# Patient Record
Sex: Male | Born: 1965 | Race: White | Hispanic: No | Marital: Single | Smoking: Current every day smoker
Health system: Southern US, Community
[De-identification: ages and names within clinical notes are randomized; demographics above are authoritative.]

## PROBLEM LIST (undated history)

## (undated) DIAGNOSIS — I10 Essential (primary) hypertension: Secondary | ICD-10-CM

## (undated) DIAGNOSIS — E119 Type 2 diabetes mellitus without complications: Secondary | ICD-10-CM

## (undated) DIAGNOSIS — R519 Headache, unspecified: Secondary | ICD-10-CM

## (undated) DIAGNOSIS — L409 Psoriasis, unspecified: Secondary | ICD-10-CM

## (undated) DIAGNOSIS — K219 Gastro-esophageal reflux disease without esophagitis: Secondary | ICD-10-CM

## (undated) DIAGNOSIS — R51 Headache: Secondary | ICD-10-CM

## (undated) DIAGNOSIS — E7800 Pure hypercholesterolemia, unspecified: Secondary | ICD-10-CM

## (undated) DIAGNOSIS — M109 Gout, unspecified: Secondary | ICD-10-CM

## (undated) DIAGNOSIS — M1990 Unspecified osteoarthritis, unspecified site: Secondary | ICD-10-CM

## (undated) HISTORY — PX: TOTAL HIP ARTHROPLASTY: SHX124

## (undated) HISTORY — PX: JOINT REPLACEMENT: SHX530

## (undated) HISTORY — DX: Type 2 diabetes mellitus without complications: E11.9

## (undated) HISTORY — PX: SPINAL FUSION: SHX223

---

## 2001-11-13 ENCOUNTER — Emergency Department (HOSPITAL_COMMUNITY): Admission: EM | Admit: 2001-11-13 | Discharge: 2001-11-13 | Payer: Self-pay | Admitting: Emergency Medicine

## 2006-01-18 ENCOUNTER — Emergency Department (HOSPITAL_COMMUNITY): Admission: EM | Admit: 2006-01-18 | Discharge: 2006-01-18 | Payer: Self-pay | Admitting: Emergency Medicine

## 2007-06-10 ENCOUNTER — Ambulatory Visit: Payer: Self-pay | Admitting: Orthopedic Surgery

## 2007-08-17 DIAGNOSIS — I10 Essential (primary) hypertension: Secondary | ICD-10-CM

## 2007-08-19 ENCOUNTER — Emergency Department (HOSPITAL_COMMUNITY): Admission: EM | Admit: 2007-08-19 | Discharge: 2007-08-19 | Payer: Self-pay | Admitting: Emergency Medicine

## 2007-09-16 ENCOUNTER — Ambulatory Visit: Payer: Self-pay | Admitting: Orthopedic Surgery

## 2007-09-16 DIAGNOSIS — M8700 Idiopathic aseptic necrosis of unspecified bone: Secondary | ICD-10-CM | POA: Insufficient documentation

## 2007-10-11 ENCOUNTER — Ambulatory Visit: Payer: Self-pay | Admitting: Orthopedic Surgery

## 2008-01-05 ENCOUNTER — Emergency Department (HOSPITAL_COMMUNITY): Admission: EM | Admit: 2008-01-05 | Discharge: 2008-01-05 | Payer: Self-pay | Admitting: Emergency Medicine

## 2008-01-12 ENCOUNTER — Inpatient Hospital Stay (HOSPITAL_COMMUNITY): Admission: RE | Admit: 2008-01-12 | Discharge: 2008-01-18 | Payer: Self-pay | Admitting: Orthopedic Surgery

## 2008-01-29 ENCOUNTER — Emergency Department (HOSPITAL_COMMUNITY): Admission: EM | Admit: 2008-01-29 | Discharge: 2008-01-29 | Payer: Self-pay | Admitting: Emergency Medicine

## 2008-02-29 ENCOUNTER — Emergency Department (HOSPITAL_COMMUNITY): Admission: EM | Admit: 2008-02-29 | Discharge: 2008-02-29 | Payer: Self-pay | Admitting: Emergency Medicine

## 2008-03-24 ENCOUNTER — Emergency Department (HOSPITAL_COMMUNITY): Admission: EM | Admit: 2008-03-24 | Discharge: 2008-03-24 | Payer: Self-pay | Admitting: Emergency Medicine

## 2008-04-15 ENCOUNTER — Emergency Department (HOSPITAL_COMMUNITY): Admission: EM | Admit: 2008-04-15 | Discharge: 2008-04-15 | Payer: Self-pay | Admitting: Emergency Medicine

## 2008-04-18 ENCOUNTER — Encounter: Payer: Self-pay | Admitting: Orthopedic Surgery

## 2008-04-29 ENCOUNTER — Emergency Department (HOSPITAL_COMMUNITY): Admission: EM | Admit: 2008-04-29 | Discharge: 2008-04-29 | Payer: Self-pay | Admitting: Emergency Medicine

## 2008-06-12 ENCOUNTER — Emergency Department (HOSPITAL_COMMUNITY): Admission: EM | Admit: 2008-06-12 | Discharge: 2008-06-13 | Payer: Self-pay | Admitting: Emergency Medicine

## 2008-10-02 ENCOUNTER — Inpatient Hospital Stay (HOSPITAL_COMMUNITY): Admission: RE | Admit: 2008-10-02 | Discharge: 2008-10-05 | Payer: Self-pay | Admitting: Orthopedic Surgery

## 2008-12-02 ENCOUNTER — Emergency Department (HOSPITAL_COMMUNITY): Admission: EM | Admit: 2008-12-02 | Discharge: 2008-12-02 | Payer: Self-pay | Admitting: Emergency Medicine

## 2009-02-27 ENCOUNTER — Emergency Department (HOSPITAL_COMMUNITY): Admission: EM | Admit: 2009-02-27 | Discharge: 2009-02-27 | Payer: Self-pay | Admitting: Emergency Medicine

## 2009-03-30 ENCOUNTER — Emergency Department (HOSPITAL_COMMUNITY): Admission: EM | Admit: 2009-03-30 | Discharge: 2009-03-30 | Payer: Self-pay | Admitting: Emergency Medicine

## 2010-06-11 ENCOUNTER — Emergency Department (HOSPITAL_COMMUNITY): Admission: EM | Admit: 2010-06-11 | Discharge: 2010-06-12 | Payer: Self-pay | Admitting: Emergency Medicine

## 2010-09-21 ENCOUNTER — Emergency Department (HOSPITAL_COMMUNITY)
Admission: EM | Admit: 2010-09-21 | Discharge: 2010-09-21 | Payer: Self-pay | Source: Home / Self Care | Admitting: Emergency Medicine

## 2010-12-12 ENCOUNTER — Ambulatory Visit: Payer: Medicaid Other | Attending: Neurosurgery | Admitting: Physical Therapy

## 2010-12-12 DIAGNOSIS — Z96649 Presence of unspecified artificial hip joint: Secondary | ICD-10-CM | POA: Insufficient documentation

## 2010-12-12 DIAGNOSIS — R5381 Other malaise: Secondary | ICD-10-CM | POA: Insufficient documentation

## 2010-12-12 DIAGNOSIS — IMO0001 Reserved for inherently not codable concepts without codable children: Secondary | ICD-10-CM | POA: Insufficient documentation

## 2010-12-12 DIAGNOSIS — M545 Low back pain, unspecified: Secondary | ICD-10-CM | POA: Insufficient documentation

## 2010-12-12 DIAGNOSIS — R293 Abnormal posture: Secondary | ICD-10-CM | POA: Insufficient documentation

## 2010-12-13 ENCOUNTER — Ambulatory Visit: Payer: Medicaid Other | Admitting: Physical Therapy

## 2010-12-15 ENCOUNTER — Emergency Department (HOSPITAL_COMMUNITY)
Admission: EM | Admit: 2010-12-15 | Discharge: 2010-12-15 | Disposition: A | Discharge status: Home / Self Care | Payer: Medicaid Other | Attending: Emergency Medicine | Admitting: Emergency Medicine

## 2010-12-15 DIAGNOSIS — I10 Essential (primary) hypertension: Secondary | ICD-10-CM | POA: Insufficient documentation

## 2010-12-15 DIAGNOSIS — M549 Dorsalgia, unspecified: Secondary | ICD-10-CM | POA: Insufficient documentation

## 2010-12-15 DIAGNOSIS — G8929 Other chronic pain: Secondary | ICD-10-CM | POA: Insufficient documentation

## 2010-12-15 DIAGNOSIS — J45909 Unspecified asthma, uncomplicated: Secondary | ICD-10-CM | POA: Insufficient documentation

## 2010-12-19 ENCOUNTER — Ambulatory Visit: Payer: Medicaid Other | Admitting: Physical Therapy

## 2010-12-26 ENCOUNTER — Ambulatory Visit: Payer: Medicaid Other | Admitting: *Deleted

## 2011-01-09 LAB — BASIC METABOLIC PANEL
Calcium: 9.1 mg/dL (ref 8.4–10.5)
Chloride: 107 mEq/L (ref 96–112)
Creatinine, Ser: 0.77 mg/dL (ref 0.4–1.5)
GFR calc Af Amer: >60 mL/min (ref >60)
GFR calc non Af Amer: >60 mL/min (ref >60)

## 2011-01-09 LAB — DIFFERENTIAL
Lymphocytes Relative: 21 % (ref 12–46)
Lymphs Abs: 1.9 10*3/uL (ref 0.7–4.0)
Monocytes Relative: 11 % (ref 3–12)
Neutro Abs: 5.6 10*3/uL (ref 1.7–7.7)
Neutrophils Relative %: 63 % (ref 43–77)

## 2011-01-09 LAB — POCT CARDIAC MARKERS
CKMB, poc: <1 ng/mL — ABNORMAL LOW (ref 1.0–8.0)
Myoglobin, poc: 59.7 ng/mL (ref 12–200)
Troponin i, poc: <0.05 ng/mL (ref 0.00–0.09)

## 2011-01-09 LAB — CBC
MCV: 89.8 fL (ref 78.0–100.0)
RBC: 4.29 MIL/uL (ref 4.22–5.81)
WBC: 8.9 10*3/uL (ref 4.0–10.5)

## 2011-01-13 LAB — PROTIME-INR
INR: 1.1 (ref 0.00–1.49)
INR: 1.7 — ABNORMAL HIGH (ref 0.00–1.49)
INR: 2.9 — ABNORMAL HIGH (ref 0.00–1.49)
Prothrombin Time: 20.9 seconds — ABNORMAL HIGH (ref 11.6–15.2)
Prothrombin Time: 32.6 seconds — ABNORMAL HIGH (ref 11.6–15.2)

## 2011-01-13 LAB — BASIC METABOLIC PANEL
BUN: 10 mg/dL (ref 6–23)
BUN: 10 mg/dL (ref 6–23)
CO2: 25 mEq/L (ref 19–32)
Calcium: 8.3 mg/dL — ABNORMAL LOW (ref 8.4–10.5)
Calcium: 8.4 mg/dL (ref 8.4–10.5)
Creatinine, Ser: 1.01 mg/dL (ref 0.4–1.5)
GFR calc Af Amer: >60 mL/min (ref >60)
GFR calc non Af Amer: >60 mL/min (ref >60)
Glucose, Bld: 117 mg/dL — ABNORMAL HIGH (ref 70–99)

## 2011-01-13 LAB — CBC
HCT: 35.8 % — ABNORMAL LOW (ref 39.0–52.0)
Hemoglobin: 10.4 g/dL — ABNORMAL LOW (ref 13.0–17.0)
MCHC: 33.6 g/dL (ref 30.0–36.0)
MCHC: 33.7 g/dL (ref 30.0–36.0)
MCHC: 34.2 g/dL (ref 30.0–36.0)
Platelets: 267 10*3/uL (ref 150–400)
Platelets: 271 10*3/uL (ref 150–400)
Platelets: 311 10*3/uL (ref 150–400)
RBC: 3.56 MIL/uL — ABNORMAL LOW (ref 4.22–5.81)
RDW: 12.4 % (ref 11.5–15.5)
RDW: 13 % (ref 11.5–15.5)

## 2011-01-13 LAB — TYPE AND SCREEN
ABO/RH(D): O POS
Antibody Screen: NEGATIVE

## 2011-01-16 ENCOUNTER — Ambulatory Visit: Payer: Medicaid Other | Attending: Neurosurgery | Admitting: Physical Therapy

## 2011-01-16 DIAGNOSIS — IMO0001 Reserved for inherently not codable concepts without codable children: Secondary | ICD-10-CM | POA: Insufficient documentation

## 2011-01-16 DIAGNOSIS — M545 Low back pain, unspecified: Secondary | ICD-10-CM | POA: Insufficient documentation

## 2011-01-16 DIAGNOSIS — Z96649 Presence of unspecified artificial hip joint: Secondary | ICD-10-CM | POA: Insufficient documentation

## 2011-01-16 DIAGNOSIS — R5381 Other malaise: Secondary | ICD-10-CM | POA: Insufficient documentation

## 2011-01-16 DIAGNOSIS — R293 Abnormal posture: Secondary | ICD-10-CM | POA: Insufficient documentation

## 2011-01-23 ENCOUNTER — Ambulatory Visit: Payer: Medicaid Other | Admitting: Physical Therapy

## 2011-02-11 NOTE — Discharge Summary (Signed)
NAMEYANCEY, PEDLEY              ACCOUNT NO.:  [id number]   MEDICAL RECORD NO.:  [id number]          PATIENT TYPE:  INP   LOCATION:  1535                         FACILITY:  [facility]   PHYSICIAN:  Ollen Gross, M.D.    DATE OF BIRTH:  Dec 26, 1965   DATE OF ADMISSION:  01/12/2008  DATE OF DISCHARGE:  01/18/2008                               DISCHARGE SUMMARY   ADDENDUM:   ADMITTING DIAGNOSES:  Same.   DISCHARGE DIAGNOSES:  Same.   HOSPITAL COURSE:  Originally, we had set the patient to on January 17, 2008; however, once the facility was found and the bed was finalized and  accepted, it was too late for the patient to be transferred.  He  remained in the hospital 1 more day.  [facility] did accept  the patient.  Arrangements were made and he went on the following day of  January 18, 2008, after being seen by Dr. Lequita Halt.   DISCHARGE PLAN:  As per previously dictated summary.   DISPOSITION:  [facility].   CONDITION UPON DISCHARGE:  Improving.      Alexzandrew L. Perkins, P.A.C.      Ollen Gross, M.D.  Electronically Signed    ALP/MEDQ  D:  01/18/2008  T:  01/18/2008  Job:  [id number]   cc:   Melvyn Novas, MD  Fax: [phone]   [facility] [place]

## 2011-02-11 NOTE — H&P (Signed)
[name], [name]              ACCOUNT NO.:  [id number]   MEDICAL RECORD NO.:  [id number]          PATIENT TYPE:  INP   LOCATION:  0007                         FACILITY:  [facility]   PHYSICIAN:  Ollen Gross, M.D.    DATE OF BIRTH:  1965-10-03   DATE OF ADMISSION:  10/02/2008  DATE OF DISCHARGE:                              HISTORY & PHYSICAL   CHIEF COMPLAINT:  Left hip pain.   HISTORY OF THE PRESENT ILLNESS:  The patient is a 45 year old male who  has been by Dr. Lequita Halt for ongoing left hip pain.  He has had a  previous right total hip earlier this year and now presents to have the  left total hip done.  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  He is felt to be a good candidate, seen by Dr. Delbert Harness and cleared for surgery.   ALLERGIES:  NO KNOWN DRUG ALLERGIES.   CURRENT MEDICATIONS:  Diovan/HCTZ, naproxen, Vytorin, Percocet and over-  the-counter Benadryl.   PAST MEDICAL HISTORY:  History of asthma, hypertension, reflux disease,  and hypercholesterolemia.   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   FAMILY HISTORY:  Mother with history of hypertension, diabetes.  Father  with history of arthritis.  Uncle with a brain tumor.   SOCIAL HISTORY:  Single, past smoker for about 30 years.  No alcohol.  One child.  He has a friend lined up to help him after surgery.   REVIEW OF SYSTEMS:  GENERAL:  No fevers, chills or night sweats.  NEURO:  No seizures, syncope or paralysis.  RESPIRATORY:  No shortness breath, productive cough or hemoptysis.  CARDIOVASCULAR:  No chest pain or orthopnea, a little bit of heartburn  reflux, not on any medications for the past month.  He was using  Prevacid and Tums over-the-counter though.  GU: Little bit of nocturia.  No dysuria, hematuria.  MUSCULOSKELETAL:  No joint pain.   PHYSICAL  EXAMINATION:  VITALS SIGNS:  Pulse 76, respirations 14, blood  pressure 114/68.  GENERAL:  A 45 year old white male well-nourished, well-developed,  slightly overweight, in no acute distress.  HEENT: Normocephalic, atraumatic.  Pupils are equal, round and reactive.  Slight right eye deviation laterally.  Poor dentition.  NECK:  Neck is supple.  CHEST:  Clear with just a faint wheezing left mid and right upper fields  and otherwise no rhonchi or rales.  HEART:  Regular rate and rhythm.  No murmur.  S1, S2 noted.  ABDOMEN:  Soft, round.  Bowel sounds present.  RECTAL, BREASTS, GENITALIA:  Not done.  EXTREMITIES:  Left hip flexion 100 degrees minimal internal rotation, 20  degrees external rotation, 20 degrees of abduction.  Right hip shows  flexion of 110, internal rotation of 20, external rotation of 30,  abduction 30.   IMPRESSION:  AVM, left hip.   PLAN:  The patient was admitted to [facility] to undergo a  left  total replacement arthroplasty.  Surgery will be performed by Dr.  Ollen Gross.      [name], P.A.C.      Ollen Gross, M.D.  Electronically Signed    ALP/MEDQ  D:  10/01/2008  T:  10/02/2008  Job:  [id number]   cc:   Ollen Gross, M.D.  Fax: [phone]   Delbert Harness, MD

## 2011-02-11 NOTE — H&P (Signed)
[name], [name]              ACCOUNT NO.:  [id number]   MEDICAL RECORD NO.:  [id number]          PATIENT TYPE:  INP   LOCATION:                               FACILITY:  [facility]   PHYSICIAN:  Ollen Gross, M.D.    DATE OF BIRTH:  August 09, 1966   DATE OF ADMISSION:  01/12/2008  DATE OF DISCHARGE:                              HISTORY & PHYSICAL   DATE OF OFFICE VISIT AND HISTORY AND PHYSICAL:  December 31, 2007.   CHIEF COMPLAINT:  Right hip pain.   HISTORY OF PRESENT ILLNESS:  The patient is a 45 year old male who has  been seen by Dr. Lequita Halt in second opinion for bilateral hip pain.  The  right is more symptomatic and problematic than left attending.  It has  been ongoing for over a year now.  It has gotten to a point where it is  hurting him all the time.  He is seen in the office and found to have  severe osteonecrosis, with complete lapse of the femoral head and bone-  on-bone changes and loss of height in the leg due to collapse.  It is  felt he would benefit from undergoing surgical intervention.  Risks and  benefits have been discussed, and the patient has elected to undergo  surgery.   ALLERGIES:  NO KNOWN DRUG ALLERGIES.   CURRENT MEDICATIONS:  Diovan/HCT, Rozerem, Vicodin, Prevacid, Vytorin.   PAST MEDICAL HISTORY:  1. Hypercholesterolemia.  2. Hypertension.  3. Reflux disease.   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   FAMILY HISTORY:  Mother with history of hypertension, diabetes.  Father  with history of arthritis.  Uncle with a brain tumor.   SOCIAL HISTORY:  Single.  Half pack per day smoker, quit alcohol about 5  months ago.  Friend and sister are scheduled to be wit him part of the  time postoperatively.   REVIEW OF SYSTEMS:  GENERAL:  No fevers, chills, of night sweats.  NEUROLOGIC:  No seizures, syncope, or paralysis.  RESPIRATORY:  No  shortness breath, productive cough, or hemoptysis.  CARDIOVASCULAR:  No  chest pain, no  orthopnea.  GI:  No nausea, vomiting, diarrhea, or  constipation.  GU: No dysuria, hematuria, or discharge.  MUSCULOSKELETAL:  Right hip.   PHYSICAL EXAMINATION:  VITAL SIGNS: Pulse 112, respirations 14, blood  pressure 110/58.  GENERAL:  A 45 year old white male, well nourished, well developed, in  no acute distress, alert and oriented, overweight.  HEENT: Normocephalic, atraumatic.  Pupils are round, reactive.  EOMs  intact.  Poor dentition.  Several upper dentition have been removed, and  also several on the bottom row.  CHEST:  Clear.  HEART:  Regular rate and rhythm.  No murmur.  ABDOMEN:  Soft, round, protuberant abdomen.  Bowel sounds present.  RECTAL/BREASTS/GENITALIA:  Not done, not pertinent to present illness.  EXTREMITIES:  Right hip.  Right leg is shortened about 1 to 1-1/2 inches  compared to left, flexion of only 90 degrees, 0 internal rotation, 0  external rotation, 10 degrees of abduction.   IMPRESSION:  1. Avascular necrosis right hip.  2. Hypertension.  3. Hypercholesterolemia.  4. Reflux disease.   PLAN:  Patient admitted to [facility] to undergo a right total  hip replacement arthroplasty.  Surgery will be performed by Dr. Ollen Gross.      [name], P.A.C.      Ollen Gross, M.D.  Electronically Signed    ALP/MEDQ  D:  01/11/2008  T:  01/12/2008  Job:  [id number]   cc:   Melvyn Novas, MD  Fax: [phone]

## 2011-02-11 NOTE — Op Note (Signed)
[name], [name]              ACCOUNT NO.:  [id number]   MEDICAL RECORD NO.:  [id number]          PATIENT TYPE:  INP   LOCATION:  0009                         FACILITY:  [facility]   PHYSICIAN:  Ollen Gross, M.D.    DATE OF BIRTH:  1965-10-02   DATE OF PROCEDURE:  01/12/2008  DATE OF DISCHARGE:                               OPERATIVE REPORT   PREOPERATIVE DIAGNOSIS:  Avascular necrosis right hip.   POSTOPERATIVE DIAGNOSIS:  Avascular necrosis right hip.   PROCEDURE:  Right total hip arthroplasty.   SURGEON:  Ollen Gross, M.D.   ASSISTANT:  Alexzandrew L. Perkins, P.A.C.   ANESTHESIA:  General.   ESTIMATED BLOOD LOSS:  Five hundred.   DRAINS:  Hemovac x1.   COMPLICATIONS:  None.   CONDITION:  Stable to recovery room.   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  He has intractable pain and presents now for total  hip arthroplasty.   PROCEDURE IN DETAIL:  After successful administration of general  anesthetic the patient was placed in the left lateral decubitus position  with the right side up and held with the hip positioner.  Right lower  extremity was isolated from his perineum with plastic drapes and prepped  and draped in the usual sterile fashion.  Short posterolateral incision  was made with a 10 blade through subcutaneous tissue to the level of the  fascia lata which was incised in line with the skin incision.  Sciatic  nerve was palpated and protected and the short external rotators  isolated off the femur.  Capsulectomy is performed and the hip is  dislocated.  The center of the femoral head is marked and a trial  prosthesis was placed such that the center of the trial head corresponds  to the center of his native femoral head.  Osteotomy lines were marked  on the femoral neck and osteotomy made with an oscillating saw.  Femoral  head was  removed and the femur retracted anteriorly to gain acetabular  exposure.   The femur was retracted anteriorly and acetabular retractors were  placed.  Labrum and osteophytes were removed.  Reaming starts at 45 mm  in coursing increments of 2-53 mm then a 54 mL Pinnacle acetabular shell  was placed in anatomic position with outstanding fit.  The apex hole  eliminator is placed and the permanent 36 mm neutral Ultamet liner is  placed.  This a metal-on-metal hip replacement.   The femur was prepared with the canal finder and irrigation.  Axial  reaming is performed to 15.5 mm, proximal reaming to 20D and the sleeve  machined to a small.  A 20D small trial sleeve is placed with 20 x 15  stem and 36 plus 12 neck 10 degrees beyond native anteversion.  His  native anteversion was barely beyond neutral.  The trial 36 plus zero  head is placed.  We needed a 36 plus 3 to get the appropriate tension.  With the 36 plus 3 we had excellent  reduction with outstanding stability  with full extension, full external rotation, 70 degrees flexion, 40  degrees adduction, 90 degrees internal rotation, 90 degrees of flexion  and 70 degrees of internal rotation.  By placing the right leg on top of  the left it appeared the leg lengths were equalized.  The hip was then  dislocated and all trials removed.  The permanent 20D small sleeve was  placed with a 20 x 15 stem and a 36 plus 12 neck 10 degrees beyond  native anteversion.  A 36 plus 3 head is placed and the hip was reduced  with the same stability parameters.  The wound was copiously irrigated  with saline solution and the short rotators reattached to the femur  through drill holes.  The fascia lata was closed over Hemovac drain with  interrupted #1 Vicryl.  Subcu closed with #1 and 2-0 Vicryl and  subcuticular running 4-0 Monocryl.  The incision was cleaned and dried  and Steri-Strips and bulky sterile dressing applied.  The drain was  hooked to suction  and he was placed into a knee immobilizer, awakened  and transferred to recovery in stable condition.      Ollen Gross, M.D.  Electronically Signed     FA/MEDQ  D:  01/12/2008  T:  01/12/2008  Job:  [id number]

## 2011-02-11 NOTE — Discharge Summary (Signed)
[name], [name]              ACCOUNT NO.:  [id number]   MEDICAL RECORD NO.:  [id number]          PATIENT TYPE:  INP   LOCATION:  1535                         FACILITY:  [facility]   PHYSICIAN:  Ollen Gross, M.D.    DATE OF BIRTH:  April 30, 1966   DATE OF ADMISSION:  01/12/2008  DATE OF DISCHARGE:  01/17/2008                               DISCHARGE SUMMARY   REPORT TITLE:  PRIORITY SUMMARY   A prior to somewhat go with the patient to the skilled nursing facility  choice.   ADMISSION DIAGNOSES:  1. Avascular necrosis of right hip.  2. Hypertension.  3. Hypercholesterolemia.  4. Reflux disease.   DISCHARGE DIAGNOSES:  1. Avascular process of right hip status post right total hip      replacement arthroplasty.  2. Mild postop blood loss anemia, did not require transfusion.  3. Hypertension.  4. Hypercholesterolemia.  5. Reflux disease.  6. Postoperative sinus tachycardia, improved.   PROCEDURE:  January 12, 2008, right total hip surgery. Surgeon Dr. Lequita Halt  assistant Avel Peace PA-C.   ANESTHESIA:  General.   CONSULTS:  None.   BRIEF HISTORY:  [name] is a 45 year old male with severe osteonecrosis  of the right hip, stage IV, with significant collapse and secondary  degenerative changes and intractable pain, who now presents for total  hip arthroplasty.   LABORATORY DATA:  Crit CBC showed hemoglobin of 14.2, hematocrit 40.7,  white cell count 9.2 place 354,000.  Preop UA was negative.  Chem panel  on admission:  Slightly elevated alk phos of 163.  Remaining Chem panel  within normal limits.  PT/INR 13.1 and 1.0 with a PTT of 33.  Serial  protimes followed and last PT/INR was 24.3 and 2.1.  Serial CBCs were  followed.  Postop hemoglobin dropped down to 10.3 then 9.4 and last H&H  was 8.9 and 25.7.  Serial BMS and electrolytes remained within normal  limits.   X-RAYS:  1. Chest x-ray on January 04, 2008, showed no active disease.  2. Right hip films January 04, 2008, showed  significant remottle-like      effect right femoral head and right acetabulum and sclerotic      changes noted of bilateral femoral head probably due to AVN.   EKG:  January 13, 2008, showed sinus tachycardia, unconfirmed.   HOSPITAL COURSE:  The patient was admitted to [facility].  He  tolerated to procedure well and later was transferred to recovery room  and orthopedic floor.  He was started on PCA and PMAGs for pain control  following surgery.  He had a fair amount to moderate amount of pain on  the evening surgery of postop day #1.  He was a little tachy and pulses  got up to about 110 and 120.  He denied any complaints.  He did an EKG  which showed a very small slight bundle branch block, but otherwise just  showed sinus tachycardia, no changes from preop.  He was started back on  his home medications.  Hemoglobin was 10.  He had excellent urinary  output.  He started  getting up out of bed.  By day #2 he was doing a  little bit better.  Pulse had improved down to 110.  Tachycardia had  improved.  Excellent output.  Dressing change and incision looked good.  He started getting up and walking about 60 feet.  I was talking to him  about possibly going home.  However, if there were no arrangements for  him to have assistance and it was felt like he would need some type of  skilled level assistance postop.  We had discharge planning involved for  a possible skilled bed.  He did stay through the weekend and slowly  progressed with this therapy.  His pulse continued to improve and he was  back down around 100 by day three and day four, back below his baseline.  He actually had a pulse between 110 and 120 preoperatively.  Incision  continued to heal well.  He had a little serous drainage from his  Hemovac site, but no signs of infection.  He was on Coumadin for DVT  prophylaxis.  By day 4, his INR was therapeutic at 2.2.  He was seen  back in rounds on day 5 and was doing well.   INR was therapeutic.  He  was waiting for a bed and would be ready for discharge on 01/17/2008 if  bed became available.   DISCHARGE/PLAN:  1. The patient is tentatively discharged on 01/17/2008.  2. Discharge diagnoses - please see above.   CURRENT MEDICATIONS:  1. Coumadin protocol - Please titrate Coumadin level for target INR      between 2.0 and 3.0; needs to be on Coumadin for three weeks from      the date of January 12, 2008, day of surgery.  2. Colace 100 mg p.o. b.i.d.  3. Diovan HCTZ 160/12.5 p.o. daily.  4. Vytorin 10/40 p.o. daily.  5. Prevacid 30 mg daily.  6. Laxative of choice __________.  7. Percocet 5 mg 1/2 every 4 hours as needed for pain.  8. Tylenol 325, 1-2 q.4-6 h. as needed for mild pain, temp or      headache.  9. Robaxin 500 mg  p.o. q.6-8h. p.r.n. spasm.  10.Restoril 50 mg p.o. q.h.s. p.r.n. sleep.  11.Rozerem 8 mg p.o. every night p.r.n.   DISCHARGE DIET:  Low-sodium heart-healthy diet.   ACTIVITIES:  Partial weightbearing to the right lower extremity.  Gait  training ambulation and ADLs as per PT and OT.  Hip precautions with  total hip protocol, daily dressing changes.  May start showering,  however do not submerge the incision under water.   FOLLOW UP:  He is to follow up with Dr. Lequita Halt in the office two weeks  from date of surgery, April 28 or April 30, on that Tuesday or Thursday,  approximately two weeks out.  He needs to call the office for an  appointment or assist the patient in calling [phone] to arrange  followup appoint with Dr. Lequita Halt at the [facility], office at  [facility].   DISPOSITION:  Pending at this time, awaiting final bed offers and  availability.   CONDITION ON DISCHARGE:  Improving.      [name], P.A.C.      Ollen Gross, M.D.  Electronically Signed    ALP/MEDQ  D:  01/17/2008  T:  01/17/2008  Job:  [id number]   cc:   Ollen Gross, M.D.  Fax: [phone]   Melvyn Novas, MD  Fax:  [phone]   [facility]  skilled nursing facility of choice

## 2011-02-11 NOTE — Discharge Summary (Signed)
[name], Ricky              ACCOUNT NO.:  [id number]   MEDICAL RECORD NO.:  [id number]          PATIENT TYPE:  INP   LOCATION:  1621                         FACILITY:  [facility]   PHYSICIAN:  Ollen Gross, M.D.    DATE OF BIRTH:  1966/03/13   DATE OF ADMISSION:  10/02/2008  DATE OF DISCHARGE:  10/05/2008                               DISCHARGE SUMMARY   ADMISSION DIAGNOSES:  1. Avascular necrosis left hip.  2. Asthma.  3. Hypertension.  4. Reflux disease.  5. Hypercholesterolemia.   DISCHARGE DIAGNOSES:  1. Avascular necrosis left hip status post left total hip replacement      arthroplasty.  2. Mild postoperative hyponatremia improved.  3. Postoperative sinus tachycardia resolved.  4. Avascular necrosis left hip.  5. Asthma.  6. Hypertension.  7. Reflux disease.  8. Hypercholesterolemia.   PROCEDURE:  October 02, 2008 left total hip.  Surgeon Dr. Lequita Halt,  assistant Avel Peace PA-C.   CONSULTS:  None.   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  He has  had a recent right total hip and now presents for the other side.   LABORATORY DATA:  Preop labs showed a hemoglobin of 16.2, hematocrit  48.1, white cell count 11.4, platelets 326.  PT/INR 13.2 and 1.0 with  PTT of 30.  Chem panel on admission showed elevated alkaline phosphatase  of 188.  Remaining Chem panel all within normal limits.   Preoperative UA was negative.  Serial CBCs were followed throughout the  hospital course.  Hemoglobin did drop to 12, then 11.  Last hemoglobin  and hematocrit 10.4, 30.4.  Serial protimes were followed per Coumadin  protocol.  Last 32.6 and 2.9.  Sodium did drop from 138 to 134 back up  to 138.  Remaining electrolytes remained all within normal limits.   X-rays:  Preop chest x-ray dated June 12, 2008 showed no acute  cardiopulmonary abnormalities.  We had previous left hip films on  September 25, 2008.  Preop AVN left femoral head with probable associated  nondisplaced fracture, lucency in cortex of left femoral head likely  representing collapse.   Portable pelvis and portable hip films showed satisfactory appearance of  left hip after total hip replacement and we did get a postop chest x-ray  on October 04, 2008 which showed no acute cardiopulmonary process.   EKG:  Preop EKG January 13, 2008 showed sinus tachycardia, nonspecific T-  wave abnormalities compared to the January 12, 2008 since last tracing  rate is fast.  We got a follow-up EKG on October 03, 2008 showed sinus  tachycardia otherwise normal rate was 122.  Followup EKG on October 04, 2008 again showed sinus tachycardia otherwise normal, unconfirmed rate  was 111, improving.   HOSPITAL COURSE:  The patient was admitted to [facility].  Tolerated the procedure well, later transferred to the recovery room and  orthopedic floor, started on PCA and p.o. analgesic pain control.  Following surgery had a pretty rough night with pain on the first night  of surgery but doing pretty well by the next day, using PCA.  Encouraged  p.o. meds.  Was a little tachycardic later that day.  Pressure was a  little low so we gave him additional fluids, started back on his home  meds, had decent output.  We allowed him to be partial weightbearing,  started getting him out of bed by day two.  His pulse rate increased to  the 90s on day one and was around anywhere from 115-120.  We did get EKG  on postop day #2 which showed sinus tachycardia otherwise normal.  He  was asymptomatic otherwise with the exception of his pulse rate.  He was  slowly getting up with physical therapy, walking short distances in the  room.  Dressing changed and incision looked good.  He was weaned over to  p.o. meds.  Sodium was down a little bit on day one but was improved  back up by day two.  His tachycardia that started on the afternoon of  day  one was a little bit better by day two.  He was back down around 115-  110.  He wanted to go back to [place] [facility] [place] where he went the  last time so we got Child psychotherapist involved. They assisted with the  paperwork.  By postop day #3 of October 05, 2008 the patient was doing  better.  His pulse rate was down in the 80s and 90s, sinus tachycardia  resolved.  His hemoglobin was stable at 10, no complaints.  He was  tolerating his meds.  His sodium had improved and he was transferred  over at that time.   DISCHARGE/PLAN:  1. Patient discharged to [facility] on October 05, 2008.  2. Discharge diagnoses please see above.  3. Discharge meds.  Current medications at time of transfer include      Coumadin protocol.  Please titrate the Coumadin level for target      INR between 2.0 and 3.0.  The patient needs to be on Coumadin for 3      weeks following the date of surgery of October 02, 2008.  Please see      bottom of the discharge summary for Coumadin regimen in the      hospital course.  Colace 100 mg p.o. b.i.d.  Diovan / HCT  160      /12.5 p.o. daily.  Vytorin 10/40 p.o. daily, Prevacid 30 mg p.o.      daily.  We did put him on Lopressor 25 mg p.o. b.i.d. for the sinus      tachycardia.  Would continue this for an additional two more days      at [facility]  and may discontinue this on October 07, 2008 (this was not a home meds.)  Percocet 5 mg one to two every 4      hours  as needed for pain.  Tylenol 325 one or two every 4-6 hours      as needed for mild pain, temperature or headache, Robaxin 500 mg      p.o. q.6-8 hours  p.r.n. spasm, Restoril 15-30 mg p.o. at bedtime      p.r.n. sleep.  He was also to use a Ventolin HFA 2 puffs four times      a day.   DIET:  Heart-healthy diet.   ACTIVITY:  He is partial weightbearing 25% to 50%  to the left lower  extremity.  Continue gait training ambulation, ADLs, hip precautions at  all times.  PT and OT for  therapy.  He may start showering, however, do  not submerge incision under water.   FOLLOW UP:  The patient needs to follow-up with Dr. Lequita Halt at the  [facility] on Tuesday the 19th or Thursday the 21st.  Please  contact the office at [phone] to help arrange appointment time and  follow-up for this patient.   DISPOSITION:  He will be going to [employer].   CONDITION ON DISCHARGE:  Improving.   Coumadin protocol:  The admission INR was 1.0.  On postop day #1 he got  10 mg dose and the INR was 1.1.  On postop day #2 he had a 7.5 mg dose  and INR was 1.7,  on postop day #3 the dose was reduced to 2.5 mg and  the INR was 2.9 coming up fast.      Alexzandrew L. Perkins, P.A.C.      Ollen Gross, M.D.  Electronically Signed    ALP/MEDQ  D:  10/05/2008  T:  10/05/2008  Job:  [id number]   cc:   Delbert Harness, MD   Ollen Gross, M.D.  Fax: [phone]

## 2011-02-11 NOTE — Op Note (Signed)
NAMESHAMERE, CAMPAS              ACCOUNT NO.:  [id number]   MEDICAL RECORD NO.:  [id number]          PATIENT TYPE:  INP   LOCATION:  1621                         FACILITY:  [facility]   PHYSICIAN:  Ollen Gross, M.D.    DATE OF BIRTH:  1966-07-31   DATE OF PROCEDURE:  10/02/2008  DATE OF DISCHARGE:                               OPERATIVE REPORT   PREOPERATIVE DIAGNOSIS:  Avascular necrosis left hip.   POSTOPERATIVE DIAGNOSIS:  Avascular necrosis left hip.   PROCEDURE:  Left total hip arthroplasty.   SURGEON:  Ollen Gross, M.D.   ASSISTANT:  Avel Peace PA-C.   ANESTHESIA:  General.   ESTIMATED BLOOD LOSS:  300.   DRAIN:  Hemovac x1.   COMPLICATIONS:  None.   CONDITION:  Stable to recovery room.   BRIEF CLINICAL NOTE:  [name] is a 45 year old male with significant  osteonecrosis of the left hip with some femoral head collapse.  He had a  recent successful right total hip arthroplasty and presents now for left  total hip arthroplasty.   PROCEDURE IN DETAIL:  After the successful administration of general  anesthetic, the patient was placed in the right lateral decubitus  position with his left side up and held with the hip positioner.  His  left lower extremity was isolated from his perineum with plastic drapes  and prepped and draped in the usual sterile fashion.  A short  posterolateral incision was made with 10 blade through subcutaneous  tissue to the level of the fascia lata which was incised in line with  the skin incision.  The sciatic nerve was palpated and protected and the  short external rotators isolated off the femur.  A capsulectomy was  performed and the hip was dislocated.  The center of the femoral head  was marked and the trial prosthesis was placed such that the center of  the trial head corresponded to the center of his native femoral head.  An osteotomy line was marked on the femoral neck and osteotomy made with  an oscillating saw.  The femoral  head was removed and then the femur  retracted anteriorly to gain acetabular exposure.   The acetabular retractors were placed and labrum and osteophytes  removed.  Acetabular reaming started at 45 mm, coursing increments of 2-  53 mm and a 54 mL pinnacle acetabular shell was placed in an anatomic  position and transfixed with two dome screws.  The apex hole eliminator  was placed and the 36 mm neutral Ultramet metal liner was placed for  metal-on-metal hip replacement.   The femur was repaired with the canal finder and irrigation.  Axial  reaming was performed to 15.5 mm, proximal reaming to a 20D and the  sleeve machine to a small.  The 20D small trial sleeve was placed with a  20 x 15 stem and a 36+ 8 neck.  The neck was matching his native  anteversion.  A 36+ 0 head was placed.  The hip needed more offset, so I  went to 36+ 12 neck with the 36+ 0 head and reduced it with  excellent  stability with full extension, full external rotation, 70 degrees  flexion, 40 degrees of adduction and about 90 degrees internal rotation  and 90 degrees of flexion and 70 degrees of internal rotation.  There  was a little bit of shuck in the hip, so I went to 36 +3 which  eliminated that and still maintained the excellent stability.  The hip  was then dislocated and the trials removed.  Permanent 20D small sleeve  was placed with a 20 x 15 stem, 36+ 12 neck, matching native  anteversion.  The 36+ 3 head was placed and the hip was reduced with the  same stability parameters.  The wound was copiously irrigated with  saline solution and the short rotators reattached to the femur through  drill holes.  The fascia lata was closed over a Hemovac drain with  interrupted #1 Vicryl, subcu closed with #1-0 and #2-0 Vicryl and  subcuticular with running 4-0 Monocryl.  Please note that after we did  the trials, the left leg had been placed on top of the right and the leg  lengths were equal.  Once closed, we then  hooked the drain up to suction  and the incisions cleaned and dried and Steri-Strips and a bulky sterile  dressing applied.  He was then placed into a knee immobilizer, awakened  and transported to recovery in stable condition.      Ollen Gross, M.D.  Electronically Signed     FA/MEDQ  D:  10/02/2008  T:  10/02/2008  Job:  [id number]

## 2011-06-24 LAB — PROTIME-INR
INR: 1.1
INR: 2 — ABNORMAL HIGH
INR: 2.1 — ABNORMAL HIGH
INR: 2.2 — ABNORMAL HIGH
INR: 2.4 — ABNORMAL HIGH
INR: 3 — ABNORMAL HIGH
Prothrombin Time: 14.5
Prothrombin Time: 22.9 — ABNORMAL HIGH
Prothrombin Time: 24.3 — ABNORMAL HIGH
Prothrombin Time: 25.1 — ABNORMAL HIGH
Prothrombin Time: 26.8 — ABNORMAL HIGH
Prothrombin Time: 32.1 — ABNORMAL HIGH

## 2011-06-24 LAB — CBC
HCT: 40.7
HCT: 25.7 — ABNORMAL LOW
HCT: 29.8 — ABNORMAL LOW
Hemoglobin: 8.9 — ABNORMAL LOW
Hemoglobin: 9.4 — ABNORMAL LOW
Platelets: 354
Platelets: 270
Platelets: 319
RBC: 3.12 — ABNORMAL LOW
RDW: 12.9
RDW: 12.9
RDW: 13.1
WBC: 9.2
WBC: 10.2
WBC: 12.4 — ABNORMAL HIGH
WBC: 13.6 — ABNORMAL HIGH

## 2011-06-24 LAB — URINALYSIS, ROUTINE W REFLEX MICROSCOPIC
Bilirubin Urine: NEGATIVE
Glucose, UA: NEGATIVE
Hgb urine dipstick: NEGATIVE
Ketones, ur: NEGATIVE
Nitrite: NEGATIVE
Protein, ur: NEGATIVE
Specific Gravity, Urine: 1.02
Urobilinogen, UA: 1
pH: 7.5

## 2011-06-24 LAB — BASIC METABOLIC PANEL
BUN: 7
CO2: 28
Calcium: 8.3 — ABNORMAL LOW
Calcium: 8.8
Chloride: 101
Creatinine, Ser: 0.8
Creatinine, Ser: 0.89
GFR calc Af Amer: >60
GFR calc Af Amer: >60
GFR calc non Af Amer: >60
GFR calc non Af Amer: >60
Glucose, Bld: 139 — ABNORMAL HIGH
Potassium: 4.5
Sodium: 135
Sodium: 136

## 2011-06-24 LAB — ABO/RH: ABO/RH(D): O POS

## 2011-06-24 LAB — COMPREHENSIVE METABOLIC PANEL
ALT: 19
AST: 22
Albumin: 3.8
Alkaline Phosphatase: 163 — ABNORMAL HIGH
BUN: 7
CO2: 32
Calcium: 9.7
Chloride: 99
Creatinine, Ser: 0.91
GFR calc Af Amer: >60
GFR calc non Af Amer: >60
Glucose, Bld: 97
Potassium: 3.9
Sodium: 140
Total Bilirubin: 1
Total Protein: 7.1

## 2011-06-24 LAB — TYPE AND SCREEN: Antibody Screen: NEGATIVE

## 2011-07-04 LAB — COMPREHENSIVE METABOLIC PANEL
ALT: 32 U/L (ref 0–53)
CO2: 27 mEq/L (ref 19–32)
Calcium: 9.6 mg/dL (ref 8.4–10.5)
Creatinine, Ser: 0.72 mg/dL (ref 0.4–1.5)
GFR calc non Af Amer: >60 mL/min (ref >60)
Glucose, Bld: 81 mg/dL (ref 70–99)
Sodium: 138 mEq/L (ref 135–145)
Total Protein: 7.8 g/dL (ref 6.0–8.3)

## 2011-07-04 LAB — URINALYSIS, ROUTINE W REFLEX MICROSCOPIC
Ketones, ur: NEGATIVE mg/dL
Nitrite: NEGATIVE
Protein, ur: NEGATIVE mg/dL
Urobilinogen, UA: 0.2 mg/dL (ref 0.0–1.0)

## 2011-07-04 LAB — CBC
Hemoglobin: 16.2 g/dL (ref 13.0–17.0)
MCHC: 33.7 g/dL (ref 30.0–36.0)
MCV: 92.1 fL (ref 78.0–100.0)
RBC: 5.22 MIL/uL (ref 4.22–5.81)
RDW: 12.9 % (ref 11.5–15.5)

## 2011-07-04 LAB — PROTIME-INR
INR: 1 (ref 0.00–1.49)
Prothrombin Time: 13.4 seconds (ref 11.6–15.2)

## 2011-07-04 LAB — APTT: aPTT: 33 seconds (ref 24–37)

## 2012-01-15 ENCOUNTER — Encounter (HOSPITAL_COMMUNITY): Payer: Self-pay

## 2012-01-15 ENCOUNTER — Emergency Department (HOSPITAL_COMMUNITY)
Admission: EM | Admit: 2012-01-15 | Discharge: 2012-01-15 | Disposition: A | Discharge status: Home / Self Care | Payer: Medicaid Other | Attending: Emergency Medicine | Admitting: Emergency Medicine

## 2012-01-15 DIAGNOSIS — F17200 Nicotine dependence, unspecified, uncomplicated: Secondary | ICD-10-CM | POA: Insufficient documentation

## 2012-01-15 DIAGNOSIS — K047 Periapical abscess without sinus: Secondary | ICD-10-CM | POA: Insufficient documentation

## 2012-01-15 DIAGNOSIS — K029 Dental caries, unspecified: Secondary | ICD-10-CM | POA: Insufficient documentation

## 2012-01-15 DIAGNOSIS — K0889 Other specified disorders of teeth and supporting structures: Secondary | ICD-10-CM

## 2012-01-15 DIAGNOSIS — K219 Gastro-esophageal reflux disease without esophagitis: Secondary | ICD-10-CM | POA: Insufficient documentation

## 2012-01-15 DIAGNOSIS — M129 Arthropathy, unspecified: Secondary | ICD-10-CM | POA: Insufficient documentation

## 2012-01-15 DIAGNOSIS — E7800 Pure hypercholesterolemia, unspecified: Secondary | ICD-10-CM | POA: Insufficient documentation

## 2012-01-15 HISTORY — DX: Gastro-esophageal reflux disease without esophagitis: K21.9

## 2012-01-15 HISTORY — DX: Unspecified osteoarthritis, unspecified site: M19.90

## 2012-01-15 HISTORY — DX: Pure hypercholesterolemia, unspecified: E78.00

## 2012-01-15 HISTORY — DX: Essential (primary) hypertension: I10

## 2012-01-15 MED ORDER — PENICILLIN V POTASSIUM 500 MG PO TABS: 500.0000 mg | ORAL_TABLET | Freq: Four times a day (QID) | ORAL | Status: AC

## 2012-01-15 MED ORDER — OXYCODONE-ACETAMINOPHEN 5-325 MG PO TABS: ORAL_TABLET | ORAL | Status: DC

## 2012-01-15 NOTE — ED Provider Notes (Signed)
Medical screening examination/treatment/procedure(s) were performed by non-physician practitioner and as supervising physician I was immediately available for consultation/collaboration.  Sharrie Self L Odas Ozer, MD 01/15/12 1549 

## 2012-01-15 NOTE — ED Provider Notes (Signed)
History     CSN: [id number]  Arrival date & time 01/15/12  1005   First MD Initiated Contact with Patient 01/15/12 1024      Chief Complaint  Patient presents with  . Mouth Lesions    (Consider location/radiation/quality/duration/timing/severity/associated sxs/prior treatment) HPI Comments: Pt c/o a whitish lesion to R inner cheek.  What he states is hurting though is one of the R lower premolars with associated jaw swelling.  Takes percocet regularly for chronic hip pain but has been taking more than usual the past couple days for mouth pain.  He denies abnormal immune status.  Not taking steroidal meds..  Does not have dentist  Patient is a 46 y.o. male presenting with mouth sores. The history is provided by the patient. No language interpreter was used.  Mouth Lesions  The current episode started 2 days ago. The onset was sudden. The problem has been unchanged. The problem is severe. The symptoms are relieved by nothing. The symptoms are aggravated by nothing. Associated symptoms include mouth sores. Pertinent negatives include no fever. He has been behaving normally. He has been eating and drinking normally.    Past Medical History  Diagnosis Date  . High blood pressure   . Asthma   . Arthritis   . Acid reflux disease   . High cholesterol     Past Surgical History  Procedure Date  . Joint replacement   . Total hip arthroplasty     No family history on file.  History  Substance Use Topics  . Smoking status: Current Everyday Smoker    Types: Cigarettes  . Smokeless tobacco: Not on file  . Alcohol Use: No      Review of Systems  Constitutional: Negative for fever.  HENT: Positive for mouth sores and dental problem.   All other systems reviewed and are negative.    Allergies  Review of patient's allergies indicates no known allergies.  Home Medications   Current Outpatient Rx  Name Route Sig Dispense Refill  . OXYCODONE-ACETAMINOPHEN 5-325 MG PO TABS   One po q 6 hrs prn pain 20 tablet 0  . PENICILLIN V POTASSIUM 500 MG PO TABS Oral Take 1 tablet (500 mg total) by mouth 4 (four) times daily. 40 tablet 0    BP 102/70  Pulse 113  Temp(Src) 98 F (36.7 C) (Oral)  Resp 18  Ht 5\' 9"  (1.753 m)  Wt 207 lb (93.895 kg)  BMI 30.57 kg/m2  SpO2 99%  Physical Exam  Nursing note and vitals reviewed. Constitutional: He is oriented to person, place, and time. He appears well-developed and well-nourished.  HENT:  Head: Normocephalic and atraumatic. No trismus in the jaw.  Mouth/Throat: Uvula is midline, oropharynx is clear and moist and mucous membranes are normal. Dental abscesses and dental caries present. No uvula swelling. No posterior oropharyngeal edema, posterior oropharyngeal erythema or tonsillar abscesses.    Eyes: EOM are normal.  Neck: Normal range of motion.  Cardiovascular: Normal rate, regular rhythm, normal heart sounds and intact distal pulses.   Pulmonary/Chest: Effort normal and breath sounds normal. No respiratory distress.  Abdominal: Soft. He exhibits no distension. There is no tenderness.  Musculoskeletal: Normal range of motion.  Lymphadenopathy:    He has no cervical adenopathy.  Neurological: He is alert and oriented to person, place, and time.  Skin: Skin is warm and dry.  Psychiatric: He has a normal mood and affect. Judgment normal.    ED Course  Procedures (including critical care  time)  Labs Reviewed - No data to display No results found.   1. Pain, dental       MDM  rx- prn VK, 40 rx- percocet, 20 Ibuprofen F/u with dentist of [place] ASAP and with dr. Janna Arch for any further pain meds.        Worthy Rancher, PA 01/15/12 [phone]

## 2012-01-15 NOTE — ED Notes (Signed)
Complain of pain and ulcers in mouth.

## 2012-01-15 NOTE — Discharge Instructions (Signed)
Dental Pain A tooth ache may be caused by cavities (tooth decay). Cavities expose the nerve of the tooth to air and hot or cold temperatures. It may come from an infection or abscess (also called a boil or furuncle) around your tooth. It is also often caused by dental caries (tooth decay). This causes the pain you are having. DIAGNOSIS  Your caregiver can diagnose this problem by exam. TREATMENT   If caused by an infection, it may be treated with medications which kill germs (antibiotics) and pain medications as prescribed by your caregiver. Take medications as directed.   Only take over-the-counter or prescription medicines for pain, discomfort, or fever as directed by your caregiver.   Whether the tooth ache today is caused by infection or dental disease, you should see your dentist as soon as possible for further care.  SEEK MEDICAL CARE IF: The exam and treatment you received today has been provided on an emergency basis only. This is not a substitute for complete medical or dental care. If your problem worsens or new problems (symptoms) appear, and you are unable to meet with your dentist, call or return to this location. SEEK IMMEDIATE MEDICAL CARE IF:   You have a fever.   You develop redness and swelling of your face, jaw, or neck.   You are unable to open your mouth.   You have severe pain uncontrolled by pain medicine.  MAKE SURE YOU:   Understand these instructions.   Will watch your condition.   Will get help right away if you are not doing well or get worse.  Document Released: 09/15/2005 Document Revised: 09/04/2011 Document Reviewed: 05/03/2008 [facility] Patient Information 2012 [place], [place].    take the meds as directed.  Follow up with the dentist of your choice.  Follow up with dr. Janna Arch for any further pain medication needs.

## 2012-02-25 ENCOUNTER — Emergency Department (HOSPITAL_COMMUNITY)
Admission: EM | Admit: 2012-02-25 | Discharge: 2012-02-25 | Disposition: A | Discharge status: Home / Self Care | Payer: Medicaid Other | Attending: Emergency Medicine | Admitting: Emergency Medicine

## 2012-02-25 ENCOUNTER — Encounter (HOSPITAL_COMMUNITY): Payer: Self-pay | Admitting: Emergency Medicine

## 2012-02-25 DIAGNOSIS — L02211 Cutaneous abscess of abdominal wall: Secondary | ICD-10-CM

## 2012-02-25 DIAGNOSIS — Z8739 Personal history of other diseases of the musculoskeletal system and connective tissue: Secondary | ICD-10-CM | POA: Insufficient documentation

## 2012-02-25 DIAGNOSIS — E7800 Pure hypercholesterolemia, unspecified: Secondary | ICD-10-CM | POA: Insufficient documentation

## 2012-02-25 DIAGNOSIS — L02219 Cutaneous abscess of trunk, unspecified: Secondary | ICD-10-CM | POA: Insufficient documentation

## 2012-02-25 DIAGNOSIS — K219 Gastro-esophageal reflux disease without esophagitis: Secondary | ICD-10-CM | POA: Insufficient documentation

## 2012-02-25 DIAGNOSIS — Z79899 Other long term (current) drug therapy: Secondary | ICD-10-CM | POA: Insufficient documentation

## 2012-02-25 DIAGNOSIS — J45909 Unspecified asthma, uncomplicated: Secondary | ICD-10-CM | POA: Insufficient documentation

## 2012-02-25 DIAGNOSIS — L03319 Cellulitis of trunk, unspecified: Secondary | ICD-10-CM | POA: Insufficient documentation

## 2012-02-25 MED ORDER — LIDOCAINE-EPINEPHRINE (PF) 1 %-1:200000 IJ SOLN
INTRAMUSCULAR | Status: AC
  Administered 2012-02-25: 08:00:00 via SUBCUTANEOUS
  Filled 2012-02-25: qty 10

## 2012-02-25 MED ORDER — LIDOCAINE-EPINEPHRINE 2 %-1:100000 IJ SOLN: 20.0000 mL | Freq: Once | INTRAMUSCULAR | Status: DC

## 2012-02-25 MED ORDER — OXYCODONE-ACETAMINOPHEN 5-325 MG PO TABS: 1.0000 | ORAL_TABLET | Freq: Four times a day (QID) | ORAL | Status: DC | PRN

## 2012-02-25 MED ORDER — CEPHALEXIN 500 MG PO CAPS: 500.0000 mg | ORAL_CAPSULE | Freq: Four times a day (QID) | ORAL | Status: DC

## 2012-02-25 MED ORDER — SULFAMETHOXAZOLE-TRIMETHOPRIM 800-160 MG PO TABS: 1.0000 | ORAL_TABLET | Freq: Three times a day (TID) | ORAL | Status: DC

## 2012-02-25 NOTE — ED Provider Notes (Signed)
History    Scribed for [employer]. Rubin Payor, MD, the patient was seen in room APA07/APA07. This chart was scribed by Katha Cabal.   CSN: [id number]  Arrival date & time 02/25/12  4098   First MD Initiated Contact with Patient 02/25/12 0701      Chief Complaint  Patient presents with  . Insect Bite    (Consider location/radiation/quality/duration/timing/severity/associated sxs/prior treatment) HPI Juliet Rude. Rubin Payor, MD entered patient's room at 0708   HUSAYN REIM is a 46 y.o. male who presents to the Emergency Department complaining of lower abdominal bump with associated redness and moderate pain from possible insect bite.  Symptoms began last night and had worsened by morning.  Patient reports redness around bump began around his belly button.  States he had similar episode 4-5 months ago but there was no bump present.  There is no history of abdominal surgery.  Patient reports feeling feverish and sweaty.  Patient states that his roommate has also had an abscess recently. He   PCP Isabella Stalling, MD, MD    Past Medical History  Diagnosis Date  . High blood pressure   . Asthma   . Arthritis   . Acid reflux disease   . High cholesterol     Past Surgical History  Procedure Date  . Joint replacement   . Total hip arthroplasty     History reviewed. No pertinent family history.  History  Substance Use Topics  . Smoking status: Current Everyday Smoker    Types: Cigarettes  . Smokeless tobacco: Not on file  . Alcohol Use: No      Review of Systems  Constitutional: Positive for fever and diaphoresis.  HENT: Negative.   Eyes: Negative.   Respiratory: Negative.   Cardiovascular: Negative.   Gastrointestinal: Negative.   Genitourinary: Negative.   Musculoskeletal: Negative.   Skin: Negative.   Neurological: Negative.   Hematological: Negative.   Psychiatric/Behavioral: Negative.     Allergies  Review of patient's allergies indicates no known  allergies.  Home Medications   Current Outpatient Rx  Name Route Sig Dispense Refill  . ALBUTEROL SULFATE HFA 108 (90 BASE) MCG/ACT IN AERS Inhalation Inhale 2 puffs into the lungs 4 (four) times daily. For shortness of breath or wheezing    . ATORVASTATIN CALCIUM 40 MG PO TABS Oral Take 40 mg by mouth daily.    . CYCLOBENZAPRINE HCL 10 MG PO TABS Oral Take 10 mg by mouth 3 (three) times daily as needed. For muscle spasms/pain    . DOXEPIN HCL 6 MG PO TABS Oral Take 1 tablet by mouth daily.    Marland Kitchen NAPROXEN 500 MG PO TABS Oral Take 500 mg by mouth 2 (two) times daily with a meal.    . OMEPRAZOLE 20 MG PO CPDR Oral Take 20 mg by mouth daily.    . OXYCODONE-ACETAMINOPHEN 5-325 MG PO TABS  One po q 6 hrs prn pain 20 tablet 0  . CEPHALEXIN 500 MG PO CAPS Oral Take 1 capsule (500 mg total) by mouth 4 (four) times daily. 20 capsule 0  . OXYCODONE-ACETAMINOPHEN 5-325 MG PO TABS Oral Take 1-2 tablets by mouth every 6 (six) hours as needed for pain. 10 tablet 0  . SULFAMETHOXAZOLE-TRIMETHOPRIM 800-160 MG PO TABS Oral Take 1 tablet by mouth 3 (three) times daily. 15 tablet 0    BP 140/86  Pulse 114  Temp(Src) 97.8 F (36.6 C) (Oral)  Resp 18  Ht 5\' 9"  (1.753 m)  Wt 199 lb (  90.266 kg)  BMI 29.39 kg/m2  SpO2 97%  Physical Exam  Nursing note and vitals reviewed. Constitutional: He is oriented to person, place, and time. He appears well-developed and well-nourished. No distress.  HENT:  Head: Normocephalic and atraumatic.  Eyes: EOM are normal.  Neck: Neck supple. No tracheal deviation present.  Cardiovascular: Normal rate and regular rhythm.   Pulmonary/Chest: Effort normal and breath sounds normal. No respiratory distress.  Abdominal: There is tenderness. No hernia. Hernia confirmed negative in the right inguinal area and confirmed negative in the left inguinal area.       10 cm erythema, 4 cm tender mass working down abdominal fat, fast ultrasound indicated abcess  Musculoskeletal: Normal  range of motion.  Neurological: He is alert and oriented to person, place, and time.  Skin: Skin is warm and dry.  Psychiatric: He has a normal mood and affect. His behavior is normal.    ED Course  Procedures (including critical care time)     DIAGNOSTIC STUDIES: Oxygen Saturation is 97% on room air, normal by my interpretation.     COORDINATION OF CARE: 0728-  Physical exam complete.   Will perform I&D.   0803-  I&D complete.  Will treat patient with antibiotics.  Patient to return for wound check in 2 days.     LABS / RADIOLOGY:   Labs Reviewed - No data to display No results found.    INCISION AND DRAINAGE Performed by: Billee Cashing. Consent: Verbal consent obtained. Risks and benefits: risks, benefits and alternatives were discussed Type: abscess  Body area: Abdomen  Anesthesia: local infiltration  Local anesthetic: lidocaine 1 % with epinephrine  Anesthetic total: 5 ml  Complexity: complex Blunt dissection to break up loculations  Drainage: purulent  Drainage amount: Mild   Packing material: 1/2 in iodoform gauze  Patient tolerance: Patient tolerated the procedure well with no immediate complications.     MDM  Patient with abscess 2 lower abdomen. There is some surrounding cellulitis. Wound was incised and drained with some minimal amount of purulent drainage. He was packed. Patient return in 2 days to have packing removed. He was discharged with antibiotics and pain medication           MEDICATIONS GIVEN IN THE E.D. Scheduled Meds:    . lidocaine-EPINEPHrine  20 mL Infiltration Once  . lidocaine-EPINEPHrine       Continuous Infusions:      IMPRESSION: 1. Abscess of abdominal wall      NEW MEDICATIONS: New Prescriptions   CEPHALEXIN (KEFLEX) 500 MG CAPSULE    Take 1 capsule (500 mg total) by mouth 4 (four) times daily.   OXYCODONE-ACETAMINOPHEN (PERCOCET) 5-325 MG PER TABLET    Take 1-2 tablets by mouth every 6 (six)  hours as needed for pain.   SULFAMETHOXAZOLE-TRIMETHOPRIM (BACTRIM DS,SEPTRA DS) 800-160 MG PER TABLET    Take 1 tablet by mouth 3 (three) times daily.     I personally performed the services described in this documentation, which was scribed in my presence. The recorded information has been reviewed and considered.      Juliet Rude. Rubin Payor, MD 02/25/12 5621

## 2012-02-25 NOTE — Discharge Instructions (Signed)

## 2012-02-25 NOTE — ED Notes (Signed)
Pt c/o insect bite to the lower abd. The area is red and warm to the touch. Pt states he noticed it last night.

## 2012-02-27 ENCOUNTER — Inpatient Hospital Stay (HOSPITAL_COMMUNITY)
Admission: EM | Admit: 2012-02-27 | Discharge: 2012-03-01 | DRG: 603 | Disposition: A | Discharge status: Home / Self Care | Payer: Medicaid Other | Attending: Family Medicine | Admitting: Family Medicine

## 2012-02-27 ENCOUNTER — Encounter (HOSPITAL_COMMUNITY): Payer: Self-pay | Admitting: Emergency Medicine

## 2012-02-27 DIAGNOSIS — Z96649 Presence of unspecified artificial hip joint: Secondary | ICD-10-CM

## 2012-02-27 DIAGNOSIS — E785 Hyperlipidemia, unspecified: Secondary | ICD-10-CM | POA: Diagnosis present

## 2012-02-27 DIAGNOSIS — Z8679 Personal history of other diseases of the circulatory system: Secondary | ICD-10-CM

## 2012-02-27 DIAGNOSIS — A4902 Methicillin resistant Staphylococcus aureus infection, unspecified site: Secondary | ICD-10-CM | POA: Diagnosis present

## 2012-02-27 DIAGNOSIS — J45909 Unspecified asthma, uncomplicated: Secondary | ICD-10-CM | POA: Diagnosis present

## 2012-02-27 DIAGNOSIS — M129 Arthropathy, unspecified: Secondary | ICD-10-CM | POA: Diagnosis present

## 2012-02-27 DIAGNOSIS — D72829 Elevated white blood cell count, unspecified: Secondary | ICD-10-CM | POA: Diagnosis present

## 2012-02-27 DIAGNOSIS — L02211 Cutaneous abscess of abdominal wall: Secondary | ICD-10-CM

## 2012-02-27 DIAGNOSIS — F17200 Nicotine dependence, unspecified, uncomplicated: Secondary | ICD-10-CM | POA: Diagnosis present

## 2012-02-27 DIAGNOSIS — I10 Essential (primary) hypertension: Secondary | ICD-10-CM | POA: Diagnosis present

## 2012-02-27 DIAGNOSIS — K219 Gastro-esophageal reflux disease without esophagitis: Secondary | ICD-10-CM | POA: Diagnosis present

## 2012-02-27 DIAGNOSIS — L0390 Cellulitis, unspecified: Secondary | ICD-10-CM

## 2012-02-27 DIAGNOSIS — L02219 Cutaneous abscess of trunk, unspecified: Principal | ICD-10-CM | POA: Diagnosis present

## 2012-02-27 DIAGNOSIS — E039 Hypothyroidism, unspecified: Secondary | ICD-10-CM | POA: Diagnosis present

## 2012-02-27 LAB — DIFFERENTIAL
Basophils Absolute: 0 10*3/uL (ref 0.0–0.1)
Basophils Relative: 0 % (ref 0–1)
Eosinophils Absolute: 0.4 10*3/uL (ref 0.0–0.7)
Neutro Abs: 17.5 10*3/uL — ABNORMAL HIGH (ref 1.7–7.7)
Neutrophils Relative %: 82 % — ABNORMAL HIGH (ref 43–77)

## 2012-02-27 LAB — BASIC METABOLIC PANEL
GFR calc Af Amer: >90 mL/min (ref >90)
GFR calc non Af Amer: >90 mL/min (ref >90)
Potassium: 4.1 mEq/L (ref 3.5–5.1)
Sodium: 134 mEq/L — ABNORMAL LOW (ref 135–145)

## 2012-02-27 LAB — LIPID PANEL
Cholesterol: 160 mg/dL (ref 0–200)
HDL: 65 mg/dL (ref >39)
Total CHOL/HDL Ratio: 2.5 RATIO
VLDL: 34 mg/dL (ref 0–40)

## 2012-02-27 LAB — HEPATIC FUNCTION PANEL
Alkaline Phosphatase: 122 U/L — ABNORMAL HIGH (ref 39–117)
Indirect Bilirubin: 0.2 mg/dL — ABNORMAL LOW (ref 0.3–0.9)
Total Bilirubin: 0.3 mg/dL (ref 0.3–1.2)
Total Protein: 7.2 g/dL (ref 6.0–8.3)

## 2012-02-27 LAB — CBC
MCH: 30.7 pg (ref 26.0–34.0)
MCHC: 33.3 g/dL (ref 30.0–36.0)
Platelets: 273 10*3/uL (ref 150–400)

## 2012-02-27 LAB — MAGNESIUM: Magnesium: 2.2 mg/dL (ref 1.5–2.5)

## 2012-02-27 MED ORDER — SULFAMETHOXAZOLE-TMP DS 800-160 MG PO TABS
1.0000 | ORAL_TABLET | Freq: Two times a day (BID) | ORAL | Status: DC
  Administered 2012-02-27 – 2012-03-01 (×7): 1 via ORAL
  Filled 2012-02-27 (×7): qty 1

## 2012-02-27 MED ORDER — SIMVASTATIN 20 MG PO TABS
20.0000 mg | ORAL_TABLET | Freq: Every day | ORAL | Status: DC
  Administered 2012-02-27 – 2012-02-29 (×3): 20 mg via ORAL
  Filled 2012-02-27 (×3): qty 1

## 2012-02-27 MED ORDER — NICOTINE 21 MG/24HR TD PT24
MEDICATED_PATCH | TRANSDERMAL | Status: AC
  Filled 2012-02-27: qty 1

## 2012-02-27 MED ORDER — ENOXAPARIN SODIUM 40 MG/0.4ML ~~LOC~~ SOLN
40.0000 mg | SUBCUTANEOUS | Status: DC
  Administered 2012-02-27 – 2012-02-29 (×3): 40 mg via SUBCUTANEOUS
  Filled 2012-02-27 (×3): qty 0.4

## 2012-02-27 MED ORDER — ONDANSETRON HCL 4 MG/2ML IJ SOLN: 4.0000 mg | Freq: Three times a day (TID) | INTRAMUSCULAR | Status: AC | PRN

## 2012-02-27 MED ORDER — LISINOPRIL 10 MG PO TABS
20.0000 mg | ORAL_TABLET | Freq: Every day | ORAL | Status: DC
  Administered 2012-02-27 – 2012-03-01 (×3): 20 mg via ORAL
  Filled 2012-02-27 (×4): qty 2

## 2012-02-27 MED ORDER — VANCOMYCIN HCL 1000 MG IV SOLR
1500.0000 mg | Freq: Two times a day (BID) | INTRAVENOUS | Status: DC
  Administered 2012-02-27 – 2012-02-29 (×5): 1500 mg via INTRAVENOUS
  Filled 2012-02-27 (×8): qty 1500

## 2012-02-27 MED ORDER — HYDROMORPHONE HCL PF 1 MG/ML IJ SOLN
1.0000 mg | INTRAMUSCULAR | Status: AC | PRN
  Administered 2012-02-27 (×3): 1 mg via INTRAVENOUS
  Filled 2012-02-27 (×2): qty 1

## 2012-02-27 MED ORDER — AMLODIPINE BESYLATE 5 MG PO TABS
5.0000 mg | ORAL_TABLET | Freq: Every day | ORAL | Status: DC
  Administered 2012-02-27 – 2012-03-01 (×3): 5 mg via ORAL
  Filled 2012-02-27 (×4): qty 1

## 2012-02-27 MED ORDER — SODIUM CHLORIDE 0.9 % IV SOLN
INTRAVENOUS | Status: AC
  Administered 2012-02-27: 11:00:00 via INTRAVENOUS

## 2012-02-27 MED ORDER — HYDROMORPHONE HCL PF 1 MG/ML IJ SOLN
1.0000 mg | Freq: Once | INTRAMUSCULAR | Status: AC
  Administered 2012-02-27: 1 mg via INTRAVENOUS
  Filled 2012-02-27: qty 1

## 2012-02-27 MED ORDER — LEVOTHYROXINE SODIUM 50 MCG PO TABS
50.0000 ug | ORAL_TABLET | Freq: Every day | ORAL | Status: DC
  Administered 2012-02-28 – 2012-03-01 (×3): 50 ug via ORAL
  Filled 2012-02-27 (×3): qty 1

## 2012-02-27 MED ORDER — VANCOMYCIN HCL IN DEXTROSE 1-5 GM/200ML-% IV SOLN
1000.0000 mg | Freq: Once | INTRAVENOUS | Status: AC
  Administered 2012-02-27: 1000 mg via INTRAVENOUS
  Filled 2012-02-27: qty 200

## 2012-02-27 MED ORDER — SODIUM CHLORIDE 0.9 % IV SOLN
INTRAVENOUS | Status: DC
  Administered 2012-02-27 – 2012-03-01 (×5): via INTRAVENOUS
  Administered 2012-03-01: 125 mL/h via INTRAVENOUS

## 2012-02-27 MED ORDER — GABAPENTIN 400 MG PO CAPS
ORAL_CAPSULE | ORAL | Status: AC
  Filled 2012-02-27: qty 1

## 2012-02-27 NOTE — ED Notes (Addendum)
Pt here for recheck rlq abscess. Pt seen in ed Wednesday.

## 2012-02-27 NOTE — ED Provider Notes (Signed)
History   This chart was scribed for Dayton Bailiff, MD by Clarita Crane. The patient was seen in room APA12/APA12. Patient's care was started at 0702.    CSN: [id number]  Arrival date & time 02/27/12  0702   First MD Initiated Contact with Patient 02/27/12 [phone]      Chief Complaint  Patient presents with  . Wound Check    (Consider location/radiation/quality/duration/timing/severity/associated sxs/prior treatment) HPI [name] is a 46 y.o. male who presents to the Emergency Department to have wound rechecked after incision and drainage was performed on an abscess to RLQ of abdomen 2 days ago in ED. Patient notes packing was placed at time of treatment and he was started on a course of abx which he has been compliant with and advised to follow up to evaluate in 2 days regarding status of wound. Patient reports redness surrounding wound is similar in severity as compared to presentation 2 days ago in ED.  Denies fever, chills, nausea, vomiting. Patient with h/o HTN, asthma, high cholesterol and denies h/o diabetes.   Past Medical History  Diagnosis Date  . High blood pressure   . Asthma   . Arthritis   . Acid reflux disease   . High cholesterol     Past Surgical History  Procedure Date  . Joint replacement   . Total hip arthroplasty     History reviewed. No pertinent family history.  History  Substance Use Topics  . Smoking status: Current Everyday Smoker    Types: Cigarettes  . Smokeless tobacco: Not on file  . Alcohol Use: No      Review of Systems  Constitutional: Negative for fever, activity change and appetite change.  HENT: Negative for congestion, sore throat, rhinorrhea, neck pain and neck stiffness.   Respiratory: Negative for cough and shortness of breath.   Cardiovascular: Negative for chest pain and palpitations.  Gastrointestinal: Positive for abdominal pain. Negative for nausea and vomiting.  Genitourinary: Negative for dysuria, urgency,  frequency and flank pain.  Musculoskeletal: Negative for myalgias, back pain and arthralgias.  Skin: Positive for rash and wound.  Neurological: Negative for dizziness, weakness, light-headedness, numbness and headaches.  All other systems reviewed and are negative.    Allergies  Review of patient's allergies indicates no known allergies.  Home Medications   Current Outpatient Rx  Name Route Sig Dispense Refill  . ALBUTEROL SULFATE HFA 108 (90 BASE) MCG/ACT IN AERS Inhalation Inhale 2 puffs into the lungs 4 (four) times daily. For shortness of breath or wheezing    . ATORVASTATIN CALCIUM 40 MG PO TABS Oral Take 40 mg by mouth daily.    . CEPHALEXIN 500 MG PO CAPS Oral Take 1 capsule (500 mg total) by mouth 4 (four) times daily. 20 capsule 0  . CYCLOBENZAPRINE HCL 10 MG PO TABS Oral Take 10 mg by mouth 3 (three) times daily as needed. For muscle spasms/pain    . DOXEPIN HCL 6 MG PO TABS Oral Take 1 tablet by mouth daily.    Marland Kitchen NAPROXEN 500 MG PO TABS Oral Take 500 mg by mouth 2 (two) times daily with a meal.    . OMEPRAZOLE 20 MG PO CPDR Oral Take 20 mg by mouth daily.    . OXYCODONE-ACETAMINOPHEN 5-325 MG PO TABS Oral Take 1-2 tablets by mouth every 6 (six) hours as needed for pain. 10 tablet 0  . SULFAMETHOXAZOLE-TRIMETHOPRIM 800-160 MG PO TABS Oral Take 1 tablet by mouth 3 (three) times daily. 15 tablet  0    BP 121/95  Pulse 116  Temp 97.9 F (36.6 C)  Resp 18  Ht 5\' 9"  (1.753 m)  Wt 199 lb (90.266 kg)  BMI 29.39 kg/m2  SpO2 99%  Physical Exam  Nursing note and vitals reviewed. Constitutional: He is oriented to person, place, and time. He appears well-developed and well-nourished. No distress.  HENT:  Head: Normocephalic and atraumatic.  Eyes: EOM are normal. Pupils are equal, round, and reactive to light.  Neck: Neck supple. No tracheal deviation present.  Cardiovascular: Normal rate, regular rhythm, normal heart sounds and intact distal pulses.  Exam reveals no gallop  and no friction rub.   No murmur heard. Pulmonary/Chest: Effort normal and breath sounds normal. No respiratory distress. He has no wheezes. He has no rales.  Abdominal: Soft. Bowel sounds are normal. He exhibits no distension. There is tenderness. There is no rebound and no guarding.       Large area of cellulitis to lower abdomen. Previous I and D incision site to RLQ. Packing removed from incision site along with significant amount pus with pressure  Musculoskeletal: Normal range of motion. He exhibits no edema.  Neurological: He is alert and oriented to person, place, and time. No sensory deficit.  Skin: Skin is warm and dry.  Psychiatric: He has a normal mood and affect. His behavior is normal.    ED Course  Procedures (including critical care time)  DIAGNOSTIC STUDIES: Oxygen Saturation is 99% on room air, normal by my interpretation.    COORDINATION OF CARE: 7:37AM- Consult complete with Dr. Janna Arch. Patient case explained and discussed. Dr. Janna Arch agrees to admit patient for further evaluation and treatment.     Labs Reviewed  CBC  DIFFERENTIAL  BASIC METABOLIC PANEL  CULTURE, BLOOD (ROUTINE X 2)  CULTURE, BLOOD (ROUTINE X 2)   No results found.   1. Abdominal wall abscess   2. Cellulitis       MDM  Significant area of abdominal wall cellulitis. Has failed outpatient treatment. Has been on Bactrim and Keflex for 2 days with worsening of his symptoms. Additional pus expressed from the wound. Discussed with Dr. Delbert Harness his primary care physician who admitted the patient for further evaluation. CBC and blood cultures ordered. Placed on vancomycin.       I personally performed the services described in this documentation, which was scribed in my presence. The recorded information has been reviewed and considered.    Dayton Bailiff, MD 02/27/12 [phone]

## 2012-02-27 NOTE — H&P (Signed)
[id number] 

## 2012-02-27 NOTE — Progress Notes (Signed)
[name] called to let him know patient is in room

## 2012-02-27 NOTE — Progress Notes (Addendum)
ANTIBIOTIC CONSULT NOTE - INITIAL  Pharmacy Consult for Vancomycin Indication: cellulitis  No Known Allergies  Patient Measurements: Height: 5\' 9"  (175.3 cm) Weight: 199 lb (90.266 kg) IBW/kg (Calculated) : 70.7   Vital Signs: Temp: 100.9 F (38.3 C) (05/31 1409) Temp src: Oral (05/31 0930) BP: 103/69 mmHg (05/31 1409) Pulse Rate: 126  (05/31 1409) Intake/Output from previous day:   Intake/Output from this shift: Total I/O In: 240 [P.O.:240] Out: 200 [Urine:200]  Labs:  Acadia Montana 02/27/12 0738  WBC 21.5*  HGB 13.7  PLT 273  LABCREA --  CREATININE 0.78   Estimated Creatinine Clearance: 128.1 ml/min (by C-G formula based on Cr of 0.78). No results found for this basename: VANCOTROUGH:2,VANCOPEAK:2,VANCORANDOM:2,GENTTROUGH:2,GENTPEAK:2,GENTRANDOM:2,TOBRATROUGH:2,TOBRAPEAK:2,TOBRARND:2,AMIKACINPEAK:2,AMIKACINTROU:2,AMIKACIN:2, in the last 72 hours   Microbiology: Recent Results (from the past 720 hour(s))  CULTURE, BLOOD (ROUTINE X 2)     Status: Normal (Preliminary result)   Collection Time   02/27/12  7:52 AM      Component Value Range Status Comment   Specimen Description Blood LEFT ARM   Final    Special Requests NONE 6CC   Final    Culture PENDING   Incomplete    Report Status PENDING   Incomplete   CULTURE, BLOOD (ROUTINE X 2)     Status: Normal (Preliminary result)   Collection Time   02/27/12  7:57 AM      Component Value Range Status Comment   Specimen Description Blood RIGHT ARM   Final    Special Requests NONE [facility]   Final    Culture PENDING   Incomplete    Report Status PENDING   Incomplete     Medical History: Past Medical History  Diagnosis Date  . High blood pressure   . Asthma   . Arthritis   . Acid reflux disease   . High cholesterol     Medications:  Scheduled:    . sodium chloride   Intravenous STAT  . amLODipine  5 mg Oral Daily  . gabapentin      .  HYDROmorphone (DILAUDID) injection  1 mg Intravenous Once  . levothyroxine  50 mcg  Oral QAC breakfast  . lisinopril  20 mg Oral Daily  . nicotine      . simvastatin  20 mg Oral q1800  . sulfamethoxazole-trimethoprim  1 tablet Oral Q12H  . vancomycin  1,000 mg Intravenous Once   Assessment: 46 yo M with abdominal wall cellulitis/abscess who has been on oral bactrim & cephalexin as an outpatient.  Presents today with worsening symptoms. Renal function at baseline. Platelet count normal.  Received Vancomycin 1gm x1 dose on admission.   Goal of Therapy:  Vancomycin trough level 10-15 mcg/ml  Plan:  1) Vancomycin 1500mg  IV q12h 2) Check Vancomycin trough at steady state 3) Monitor renal function and cx data  4) Lovenox 40 sq q24hr for VTE prophylaxis.   Elson Clan 02/27/2012,2:29 PM

## 2012-02-27 NOTE — H&P (Signed)
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  He was seen in the ER, placed on Septra, and it seemed to be diffuse erythema spreading rapidly.  He was admitted for abdominal wall, possible abscess/cellulitis, and placed on IV vancomycin.  He denies any fevers chills, although he is mildly febrile and has a significant leukocytosis of 21,000.  The patient will be admitted for antibiotics and Surgical consultation, although he had an I and D or debridement in the emergency room this morning.  PAST MEDICAL HISTORY:  Significant for hypertension, hyperlipidemia, hypothyroidism, anxiety, and some vertigo.  PAST SURGICAL HISTORY:  Remarkable for right and left hip arthroplasty.  ALLERGIES:  He has no known allergies.  CURRENT MEDICINES: 1. Azor 5/20 p.o. daily. 2. Lipitor 40 mg p.o. daily. 3. Naprosyn 500 mg p.o. b.i.d. 4. Synthroid 50 mcg p.o. daily. 5. Meclizine 25 mg p.o. at bedtime p.r.n. 6. Percocet 5/325 p.o. b.i.d. p.r.n. for hip pain when ambulating.  REVIEW OF SYSTEMS:  Negative for seizures, tremors, polyuria, polydipsia, nausea, vomiting, melena, hematemesis, hematochezia.  PHYSICAL EXAMINATION:  VITAL SIGNS:  Blood pressure is 132/83, temperature is a 100.9, pulse is 126 and regular, respiratory rate is 18, O2 sat is 95%. HEENT:  Eyes, PERRLA.   Extraocular movements intact.  Sclerae clear. Conjunctivae pink. NECK:  No JVD.  No carotid bruits.  No thyromegaly or thyroid bruits. LUNGS:  Diminished breath sounds at the bases.  No rales, wheeze, or rhonchi appreciable. HEART:  Regular rhythm.  No murmurs, gallops, heaves, thrills, or rubs. ABDOMEN:  No diffuse erythema of the periumbilical area measuring about 6 inches x 4 inches, tender to touch.  No guarding or rebound.  No masses.  No detectable organomegaly.  EXTREMITIES:  No clubbing, cyanosis, or edema. NEUROLOGIC:  Cranial nerves 2 through 12 grossly intact.  Patient moves all 4 extremities.  Plantars downgoing.  IMPRESSION: 1. Abdominal cellulitis rapidly advancing progressive, consider     abdominal abscess with MRSA or staph or strep. 2. Bilateral right and left hip arthroplasties. 3. Hypertension. 4. Hyperlipidemia. 5. Hypothyroidism.  PLAN:  To admit, continue on Septra DS b.i.d.  Add vancomycin per pharmacy protocol.  Obtain Surgical consultation, and continue home medicines.Melvyn Novas, MD     RMD/MEDQ  D:  02/27/2012  T:  02/27/2012  Job:  [id number]

## 2012-02-28 LAB — DIFFERENTIAL
Basophils Absolute: 0 10*3/uL (ref 0.0–0.1)
Basophils Relative: 0 % (ref 0–1)
Eosinophils Absolute: 0.3 10*3/uL (ref 0.0–0.7)
Eosinophils Relative: 2 % (ref 0–5)
Lymphocytes Relative: 10 % — ABNORMAL LOW (ref 12–46)
Lymphs Abs: 2 10*3/uL (ref 0.7–4.0)
Monocytes Absolute: 1.6 10*3/uL — ABNORMAL HIGH (ref 0.1–1.0)
Monocytes Relative: 8 % (ref 3–12)
Neutro Abs: 16.5 10*3/uL — ABNORMAL HIGH (ref 1.7–7.7)
Neutrophils Relative %: 81 % — ABNORMAL HIGH (ref 43–77)

## 2012-02-28 LAB — CBC
MCHC: 33.5 g/dL (ref 30.0–36.0)
RDW: 15.9 % — ABNORMAL HIGH (ref 11.5–15.5)
WBC: 20.4 10*3/uL — ABNORMAL HIGH (ref 4.0–10.5)

## 2012-02-28 LAB — HEPATIC FUNCTION PANEL
Albumin: 2.7 g/dL — ABNORMAL LOW (ref 3.5–5.2)
Total Protein: 7.2 g/dL (ref 6.0–8.3)

## 2012-02-28 MED ORDER — MUPIROCIN 2 % EX OINT
TOPICAL_OINTMENT | Freq: Two times a day (BID) | CUTANEOUS | Status: DC
  Administered 2012-02-28 – 2012-03-01 (×5): via TOPICAL
  Filled 2012-02-28: qty 22

## 2012-02-28 MED ORDER — SODIUM CHLORIDE 0.9 % IJ SOLN
INTRAMUSCULAR | Status: AC
  Filled 2012-02-28: qty 3

## 2012-02-28 MED ORDER — HYDROCODONE-ACETAMINOPHEN 5-325 MG PO TABS
1.0000 | ORAL_TABLET | Freq: Four times a day (QID) | ORAL | Status: DC | PRN
  Administered 2012-02-28 – 2012-03-01 (×6): 1 via ORAL
  Filled 2012-02-28 (×6): qty 1

## 2012-02-28 NOTE — Progress Notes (Signed)
[id number] 

## 2012-02-28 NOTE — Consult Note (Signed)
Reason for Consult: Abdominal wall abscess Referring Physician: Dr. Delbert Harness  [name] is an 46 y.o. male.  HPI: Patient is a 46 year old white male who presented emergency room with worsening pain and redness in the lower aspect of the abdominal wall. Incision and drainage was performed in the emergency room which was successful in draining purulent fluid. He was admitted to the hospital for intravenous antibiotic management.  Past Medical History  Diagnosis Date  . High blood pressure   . Asthma   . Arthritis   . Acid reflux disease   . High cholesterol     Past Surgical History  Procedure Date  . Joint replacement   . Total hip arthroplasty     History reviewed. No pertinent family history.  Social History:  reports that he has been smoking Cigarettes.  He does not have any smokeless tobacco history on file. He reports that he does not drink alcohol. His drug history not on file.  Allergies: No Known Allergies  Medications: I have reviewed the patient's current medications.  Results for orders placed during the hospital encounter of 02/27/12 (from the past 48 hour(s))  LIPID PANEL     Status: Abnormal   Collection Time   02/27/12  7:30 AM      Component Value Range Comment   Cholesterol 160  0 - 200 (mg/dL)    Triglycerides 161 (*) <150 (mg/dL)    HDL 65  >09 (mg/dL)    Total CHOL/HDL Ratio 2.5      VLDL 34  0 - 40 (mg/dL)    LDL Cholesterol 61  0 - 99 (mg/dL)   HEPATIC FUNCTION PANEL     Status: Abnormal   Collection Time   02/27/12  7:30 AM      Component Value Range Comment   Total Protein 7.2  6.0 - 8.3 (g/dL)    Albumin 2.8 (*) 3.5 - 5.2 (g/dL)    AST 12  0 - 37 (U/L)    ALT 14  0 - 53 (U/L)    Alkaline Phosphatase 122 (*) 39 - 117 (U/L)    Total Bilirubin 0.3  0.3 - 1.2 (mg/dL)    Bilirubin, Direct 0.1  0.0 - 0.3 (mg/dL)    Indirect Bilirubin 0.2 (*) 0.3 - 0.9 (mg/dL)   MAGNESIUM     Status: Normal   Collection Time   02/27/12  7:30 AM   Component Value Range Comment   Magnesium 2.2  1.5 - 2.5 (mg/dL)   CBC     Status: Abnormal   Collection Time   02/27/12  7:38 AM      Component Value Range Comment   WBC 21.5 (*) 4.0 - 10.5 (K/uL)    RBC 4.46  4.22 - 5.81 (MIL/uL)    Hemoglobin 13.7  13.0 - 17.0 (g/dL)    HCT 60.4  54.0 - 98.1 (%)    MCV 92.2  78.0 - 100.0 (fL)    MCH 30.7  26.0 - 34.0 (pg)    MCHC 33.3  30.0 - 36.0 (g/dL)    RDW 19.1 (*) 47.8 - 15.5 (%)    Platelets 273  150 - 400 (K/uL)   DIFFERENTIAL     Status: Abnormal   Collection Time   02/27/12  7:38 AM      Component Value Range Comment   Neutrophils Relative 82 (*) 43 - 77 (%)    Neutro Abs 17.5 (*) 1.7 - 7.7 (K/uL)    Lymphocytes Relative  9 (*) 12 - 46 (%)    Lymphs Abs 2.0  0.7 - 4.0 (K/uL)    Monocytes Relative 7  3 - 12 (%)    Monocytes Absolute 1.6 (*) 0.1 - 1.0 (K/uL)    Eosinophils Relative 2  0 - 5 (%)    Eosinophils Absolute 0.4  0.0 - 0.7 (K/uL)    Basophils Relative 0  0 - 1 (%)    Basophils Absolute 0.0  0.0 - 0.1 (K/uL)   BASIC METABOLIC PANEL     Status: Abnormal   Collection Time   02/27/12  7:38 AM      Component Value Range Comment   Sodium 134 (*) 135 - 145 (mEq/L)    Potassium 4.1  3.5 - 5.1 (mEq/L)    Chloride 98  96 - 112 (mEq/L)    CO2 25  19 - 32 (mEq/L)    Glucose, Bld 94  70 - 99 (mg/dL)    BUN 14  6 - 23 (mg/dL)    Creatinine, Ser 9.60  0.50 - 1.35 (mg/dL)    Calcium 9.8  8.4 - 10.5 (mg/dL)    GFR calc non Af Amer >90  >90 (mL/min)    GFR calc Af Amer >90  >90 (mL/min)   CULTURE, BLOOD (ROUTINE X 2)     Status: Normal (Preliminary result)   Collection Time   02/27/12  7:52 AM      Component Value Range Comment   Specimen Description Blood LEFT ARM      Special Requests NONE 6CC      Culture PENDING      Report Status PENDING     CULTURE, BLOOD (ROUTINE X 2)     Status: Normal (Preliminary result)   Collection Time   02/27/12  7:57 AM      Component Value Range Comment   Specimen Description Blood RIGHT ARM       Special Requests NONE [facility]      Culture PENDING      Report Status PENDING     CBC     Status: Abnormal   Collection Time   02/28/12  4:21 AM      Component Value Range Comment   WBC 20.4 (*) 4.0 - 10.5 (K/uL)    RBC 4.28  4.22 - 5.81 (MIL/uL)    Hemoglobin 13.3  13.0 - 17.0 (g/dL)    HCT 45.4  09.8 - 11.9 (%)    MCV 92.8  78.0 - 100.0 (fL)    MCH 31.1  26.0 - 34.0 (pg)    MCHC 33.5  30.0 - 36.0 (g/dL)    RDW 14.7 (*) 82.9 - 15.5 (%)    Platelets 290  150 - 400 (K/uL)   DIFFERENTIAL     Status: Abnormal   Collection Time   02/28/12  4:21 AM      Component Value Range Comment   Neutrophils Relative 81 (*) 43 - 77 (%)    Neutro Abs 16.5 (*) 1.7 - 7.7 (K/uL)    Lymphocytes Relative 10 (*) 12 - 46 (%)    Lymphs Abs 2.0  0.7 - 4.0 (K/uL)    Monocytes Relative 8  3 - 12 (%)    Monocytes Absolute 1.6 (*) 0.1 - 1.0 (K/uL)    Eosinophils Relative 2  0 - 5 (%)    Eosinophils Absolute 0.3  0.0 - 0.7 (K/uL)    Basophils Relative 0  0 - 1 (%)    Basophils Absolute  0.0  0.0 - 0.1 (K/uL)   HEPATIC FUNCTION PANEL     Status: Abnormal   Collection Time   02/28/12  4:21 AM      Component Value Range Comment   Total Protein 7.2  6.0 - 8.3 (g/dL)    Albumin 2.7 (*) 3.5 - 5.2 (g/dL)    AST 12  0 - 37 (U/L)    ALT 12  0 - 53 (U/L)    Alkaline Phosphatase 154 (*) 39 - 117 (U/L)    Total Bilirubin 0.3  0.3 - 1.2 (mg/dL)    Bilirubin, Direct 0.1  0.0 - 0.3 (mg/dL)    Indirect Bilirubin 0.2 (*) 0.3 - 0.9 (mg/dL)     No results found.  ROS: See chart Blood pressure 102/69, pulse 98, temperature 97.5 F (36.4 C), temperature source Oral, resp. rate 20, height 5\' 9"  (1.753 m), weight 90.266 kg (199 lb), SpO2 94.00%. Physical Exam: Pleasant white male in no acute distress. Abdominal wall with open indurated area in the lower aspect of the abdomen. Some purulent drainage still present. Patient states this is decreased from yesterday.  Assessment/Plan: Impression: Abdominal wall abscess, resolving.  Have written wound care orders. Would continue IV antibiotics. Will follow with you.  [name] A 02/28/2012, 9:47 AM

## 2012-02-29 LAB — CBC
MCH: 30.7 pg (ref 26.0–34.0)
MCV: 92.7 fL (ref 78.0–100.0)
Platelets: 302 10*3/uL (ref 150–400)
RBC: 4.1 MIL/uL — ABNORMAL LOW (ref 4.22–5.81)

## 2012-02-29 LAB — BASIC METABOLIC PANEL
CO2: 24 mEq/L (ref 19–32)
Calcium: 9.1 mg/dL (ref 8.4–10.5)
Creatinine, Ser: 0.75 mg/dL (ref 0.50–1.35)
Glucose, Bld: 89 mg/dL (ref 70–99)

## 2012-02-29 LAB — HEPATIC FUNCTION PANEL
ALT: 12 U/L (ref 0–53)
AST: 9 U/L (ref 0–37)
Albumin: 2.5 g/dL — ABNORMAL LOW (ref 3.5–5.2)
Alkaline Phosphatase: 108 U/L (ref 39–117)
Total Protein: 6.8 g/dL (ref 6.0–8.3)

## 2012-02-29 LAB — DIFFERENTIAL
Eosinophils Absolute: 0.5 10*3/uL (ref 0.0–0.7)
Eosinophils Relative: 4 % (ref 0–5)
Lymphs Abs: 2.2 10*3/uL (ref 0.7–4.0)
Monocytes Absolute: 1.3 10*3/uL — ABNORMAL HIGH (ref 0.1–1.0)
Monocytes Relative: 11 % (ref 3–12)
Neutrophils Relative %: 67 % (ref 43–77)

## 2012-02-29 LAB — TSH: TSH: 1.308 u[IU]/mL (ref 0.350–4.500)

## 2012-02-29 NOTE — Consult Note (Signed)
  Subjective: Patient feels better.  Objective: Vital signs in last 24 hours: Temp:  [97.4 F (36.3 C)-98.1 F (36.7 C)] 97.4 F (36.3 C) (06/02 0454) Pulse Rate:  [81-98] 81  (06/02 0454) Resp:  [18-20] 18  (06/02 0454) BP: (94-96)/(62-63) 94/63 mmHg (06/02 0454) SpO2:  [98 %-99 %] 99 % (06/02 0454) Last BM Date: 02/28/12  Intake/Output from previous day: 06/01 0701 - 06/02 0700 In: 2625 [I.V.:1625; IV Piggyback:1000] Out: -  Intake/Output this shift:    Abdomen: Cellulitis and induration much improved in the right lower aspect of the abdominal wall. Minimal purulent drainage noted.  Final cultures still pending.  Lab Results:   Basename 02/29/12 0411 02/28/12 0421  WBC 12.3* 20.4*  HGB 12.6* 13.3  HCT 38.0* 39.7  PLT 302 290   BMET  Basename 02/29/12 0411 02/27/12 0738  NA 137 134*  K 4.2 4.1  CL 104 98  CO2 24 25  GLUCOSE 89 94  BUN 14 14  CREATININE 0.75 0.78  CALCIUM 9.1 9.8   PT/INR No results found for this basename: LABPROT:2,INR:2 in the last 72 hours  Studies/Results: No results found.  Anti-infectives: Anti-infectives     Start     Dose/Rate Route Frequency Ordered Stop   02/27/12 1600   vancomycin (VANCOCIN) 1,500 mg in sodium chloride 0.9 % 500 mL IVPB        1,500 mg 250 mL/hr over 120 Minutes Intravenous Every 12 hours 02/27/12 1438     02/27/12 1430  sulfamethoxazole-trimethoprim (BACTRIM DS) 800-160 MG per tablet 1 tablet       1 tablet Oral Every 12 hours 02/27/12 1420     02/27/12 0730   vancomycin (VANCOCIN) IVPB 1000 mg/200 mL premix        1,000 mg 200 mL/hr over 60 Minutes Intravenous  Once 02/27/12 0726 02/27/12 0852          Assessment/Plan: Impression: Abscess, abdominal wall, remarkably improving. Plan: Would continue treating this like a MRSA until final cultures are back. Would continue local wound care. Bactrim or tobramycin is indicated. I would be happy to follow this in the office in followup as needed. This  was discussed with Dr. Delbert Harness.  LOS: 2 days    Nancy Manuele A 02/29/2012

## 2012-02-29 NOTE — Progress Notes (Signed)
[id number] 

## 2012-03-01 LAB — HEPATIC FUNCTION PANEL
ALT: 18 U/L (ref 0–53)
Alkaline Phosphatase: 110 U/L (ref 39–117)
Bilirubin, Direct: <0.1 mg/dL (ref 0.0–0.3)

## 2012-03-01 LAB — VANCOMYCIN, TROUGH: Vancomycin Tr: 24 ug/mL — ABNORMAL HIGH (ref 10.0–20.0)

## 2012-03-01 LAB — BASIC METABOLIC PANEL
Calcium: 9.3 mg/dL (ref 8.4–10.5)
Creatinine, Ser: 0.72 mg/dL (ref 0.50–1.35)
GFR calc Af Amer: >90 mL/min (ref >90)
GFR calc non Af Amer: >90 mL/min (ref >90)

## 2012-03-01 MED ORDER — VANCOMYCIN HCL IN DEXTROSE 1-5 GM/200ML-% IV SOLN
1000.0000 mg | Freq: Two times a day (BID) | INTRAVENOUS | Status: DC
  Administered 2012-03-01: 1000 mg via INTRAVENOUS
  Filled 2012-03-01 (×2): qty 200

## 2012-03-01 MED ORDER — LISINOPRIL 20 MG PO TABS: 20.0000 mg | ORAL_TABLET | Freq: Every day | ORAL | Status: DC

## 2012-03-01 MED ORDER — SULFAMETHOXAZOLE-TMP DS 800-160 MG PO TABS: 1.0000 | ORAL_TABLET | Freq: Two times a day (BID) | ORAL | Status: AC

## 2012-03-01 MED ORDER — PNEUMOCOCCAL VAC POLYVALENT 25 MCG/0.5ML IJ INJ
0.5000 mL | INJECTION | INTRAMUSCULAR | Status: AC
  Administered 2012-03-01: 0.5 mL via INTRAMUSCULAR
  Filled 2012-03-01: qty 0.5

## 2012-03-01 MED ORDER — LEVOTHYROXINE SODIUM 50 MCG PO TABS: 50.0000 ug | ORAL_TABLET | Freq: Every day | ORAL | Status: DC

## 2012-03-01 MED ORDER — CEPHALEXIN 500 MG PO CAPS: 500.0000 mg | ORAL_CAPSULE | Freq: Four times a day (QID) | ORAL | Status: AC

## 2012-03-01 NOTE — Discharge Summary (Signed)
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  The patient apparently had a cellulitis on his abdomen, it was seen, given Keflex, initially seen in the ER, it turned out that this was clinically a MRSA infection which is spreading rapidly, subsequently admitted, placed on IV and p.o.  Septra as well as vancomycin, seen in consultation by Surgery.  His lungs are clear.  Heart unremarkable.  His abdomen was otherwise benign except for the tenderness.  Over a 3-4 day period his erythema and swelling area has markedly diminished.  Less tenderness, less rubor, less induration.  He subsequently discharged on Septra DS 1 p.o. b.i.d. for 30 days, Keflex 500 mg p.o. t.i.d. for a week, Synthroid 50 p.o. daily, lisinopril 20 mg p.o. daily, albuterol HFA 2 puffs q.i.d., Lipitor 40 mg p.o. daily, Naprosyn 500 mg p.o. b.i.d., Prilosec 20 mg p.o. daily, and Percocet 5/325 one p.o. q.i.d. p.r.n.  He will follow up in the office in 1 week's time to assess response the antibiotic outpatient therapy.     Melvyn Novas, MD     RMD/MEDQ  D:  03/01/2012  T:  03/01/2012  Job:  [id number]

## 2012-03-01 NOTE — Progress Notes (Signed)
[name], [name]              ACCOUNT NO.:  [id number]  MEDICAL RECORD NO.:  [id number]  LOCATION:                                 FACILITY:  PHYSICIAN:  Melvyn Novas, MDDATE OF BIRTH:  1965/11/13  DATE OF PROCEDURE:  02/28/2012 DATE OF DISCHARGE:                                PROGRESS NOTE   The patient is in with 3- to 4-day history of rapidly progressive cellulitis thought to be MRSA, placed on Septra DS by ER doctor 4 days ago.  He was subsequently admitted to the hospital also added vancomycin IV with I and D by ER doctor.  The patient has leukocytosis, white count of 16[address] yesterday and now down to 20.  He likewise has hypertension, hyperlipidemia, minimal cognitive dysfunction, bilateral hip arthroplasties, and some chronic pain issues due to hip arthroplasties. The patient ambulates every day is cooperative and compliant.  He likewise has hypothyroidism and this is well controlled.  Today blood pressure 102/69, temperature 97.5, pulse is 98, respirations 20, O2 saturation 94%.  Lungs are clear.  Heart unremarkable.  Regular rhythm.  No S3 or S4.  No heaves, thrills, or rubs.  Abdomen diffuse erythema, seems to be less advanced than yesterday as far as area, some drainage from I and D.  PLAN:  Right now is continue Septra DS b.i.d. as well as vancomycin IV per pharmacy protocol, continue Norvasc, lisinopril, Zocor, sulfamethoxazole, and await surgical consultation for added input.     Melvyn Novas, MD     RMD/MEDQ  D:  02/28/2012  T:  02/28/2012  Job:  [id number]

## 2012-03-01 NOTE — Discharge Summary (Signed)
[id number] 

## 2012-03-01 NOTE — Progress Notes (Signed)
ANTIBIOTIC CONSULT NOTE  Pharmacy Consult for Vancomycin Indication: cellulitis  No Known Allergies  Patient Measurements: Height: 5\' 9"  (175.3 cm) Weight: 199 lb (90.266 kg) IBW/kg (Calculated) : 70.7   Vital Signs: Temp: 97 F (36.1 C) (06/03 0517) Temp src: Oral (06/03 0517) BP: 116/65 mmHg (06/03 0857) Pulse Rate: 99  (06/03 0857) Intake/Output from previous day: 06/02 0701 - 06/03 0700 In: 3912.9 [P.O.:365; I.V.:3047.9; IV Piggyback:500] Out: -  Intake/Output from this shift:    Labs:  Basename 03/01/12 0305 02/29/12 0411 02/28/12 0421  WBC -- 12.3* 20.4*  HGB -- 12.6* 13.3  PLT -- 302 290  LABCREA -- -- --  CREATININE 0.72 0.75 --   Estimated Creatinine Clearance: 128.1 ml/min (by C-G formula based on Cr of 0.72).  Basename 03/01/12 0305  VANCOTROUGH 24.0*  VANCOPEAK --  Drue Dun --  GENTTROUGH --  GENTPEAK --  GENTRANDOM --  TOBRATROUGH --  Nolen Mu --  TOBRARND --  AMIKACINPEAK --  AMIKACINTROU --  AMIKACIN --     Microbiology: Recent Results (from the past 720 hour(s))  CULTURE, BLOOD (ROUTINE X 2)     Status: Normal (Preliminary result)   Collection Time   02/27/12  7:52 AM      Component Value Range Status Comment   Specimen Description Blood LEFT ARM   Final    Special Requests NONE 6CC   Final    Culture NO GROWTH 2 DAYS   Final    Report Status PENDING   Incomplete   CULTURE, BLOOD (ROUTINE X 2)     Status: Normal (Preliminary result)   Collection Time   02/27/12  7:57 AM      Component Value Range Status Comment   Specimen Description Blood RIGHT ARM   Final    Special Requests NONE 6CC   Final    Culture NO GROWTH 2 DAYS   Final    Report Status PENDING   Incomplete     Medical History: Past Medical History  Diagnosis Date  . High blood pressure   . Asthma   . Arthritis   . Acid reflux disease   . High cholesterol     Medications:  Scheduled:     . amLODipine  5 mg Oral Daily  . enoxaparin (LOVENOX) injection  40  mg Subcutaneous Q24H  . levothyroxine  50 mcg Oral QAC breakfast  . lisinopril  20 mg Oral Daily  . mupirocin ointment   Topical BID  . simvastatin  20 mg Oral q1800  . sulfamethoxazole-trimethoprim  1 tablet Oral Q12H  . vancomycin  1,500 mg Intravenous Q12H   Assessment: 46 yo M with abdominal wall cellulitis/abscess who has been on oral bactrim & cephalexin as an outpatient with worsening symptoms admitted for IV antibiotics. He is currently on day#4 vancomycin for presumed MRSA infection. Blood cx pending. I&D was performed but do not see wound cx in process. Vancomycin trough above goal level and 4am dose held.  Renal function stable. Platelet count normal.   Goal of Therapy:  Vancomycin trough level 10-15 mcg/ml  Plan:  1) Decrease Vancomycin 1000mg  IV q12h 2) Weekly Vancomycin trough  3) Monitor renal function and cx data  4) Continue Lovenox 40 sq q24hr for VTE prophylaxis.   Elson Clan 03/01/2012,10:21 AM

## 2012-03-01 NOTE — Progress Notes (Addendum)
Pt discharged with instructions, prescriptions, and care notes.  The pt verbalizes understanding and voices no further questions or concerns at this time.  Pt ambulated off the floor with staff in stable condition.  Dr. Lovell Sheehan and Dr. Janna Arch made aware of no pending wound culture at this time.

## 2012-03-01 NOTE — Progress Notes (Signed)
NAMEREVANTH, NEIDIG              ACCOUNT NO.:  [id number]  MEDICAL RECORD NO.:  [id number]  LOCATION:                                 FACILITY:  PHYSICIAN:  Melvyn Novas, MDDATE OF BIRTH:  01/27/1966  DATE OF PROCEDURE:  02/29/2012 DATE OF DISCHARGE:                                PROGRESS NOTE   The patient has severe abdominal cellulitis presumably MRSA, possibly staph strep on combined therapy with Septra DS as well as vancomycin IV, had an I and D in the ER.  The ER doctor seen in consult by surgery. White count dropped from 21-12000, less area of erythema and tenderness in the abdominal wall, some minor drainage from I and D site.  OBJECTIVE:  LUNGS: Clear. HEART: Unremarkable. VITAL SIGNS: 94/63, temperature 97.4, pulse is 81 and regular, respiratory rate is 18.  PLAN:  Right now, he has to continue to do antibiotics for several more days.  Appreciate surgical input and continue the blood pressure, lipid, and thyroid medicines.     Melvyn Novas, MD     RMD/MEDQ  D:  02/29/2012  T:  02/29/2012  Job:  [id number]

## 2012-03-01 NOTE — Discharge Instructions (Signed)
Sulfamethoxazole; Trimethoprim, SMX-TMP tablets What is this medicine? SULFAMETHOXAZOLE; TRIMETHOPRIM or SMX-TMP (suhl fuh meth OK suh zohl; trye METH oh prim) is a combination of a sulfonamide antibiotic and a second antibiotic, trimethoprim. It is used to treat or prevent certain kinds of bacterial infections. It will not work for colds, flu, or other viral infections. This medicine may be used for other purposes; ask your health care provider or pharmacist if you have questions. What should I tell my health care provider before I take this medicine? They need to know if you have any of these conditions: -anemia -asthma -being treated with anticonvulsants -if you frequently drink alcohol containing drinks -kidney disease -liver disease -low level of folic acid or [id number] dehydrogenase -poor nutrition or malabsorption -porphyria -severe allergies -thyroid disorder -an unusual or allergic reaction to sulfamethoxazole, trimethoprim, sulfa drugs, other medicines, foods, dyes, or preservatives -pregnant or trying to get pregnant -breast-feeding How should I use this medicine? Take this medicine by mouth with a full glass of water. Follow the directions on the prescription label. Take your medicine at regular intervals. Do not take it more often than directed. Do not skip doses or stop your medicine early. Talk to your pediatrician regarding the use of this medicine in children. Special care may be needed. This medicine has been used in children as young as 47 months of age. Overdosage: If you think you have taken too much of this medicine contact a poison control center or emergency room at once. NOTE: This medicine is only for you. Do not share this medicine with others. What if I miss a dose? If you miss a dose, take it as soon as you can. If it is almost time for your next dose, take only that dose. Do not take double or extra doses. What may interact with this medicine? Do not  take this medicine with any of the following medications: -aminobenzoate potassium -dofetilide -metronidazole This medicine may also interact with the following medications: -ACE inhibitors like benazepril, enalapril, lisinopril, and ramipril -cyclosporine -digoxin -diuretics -indomethacin -medicines for diabetes -methenamine -methotrexate -phenytoin -potassium supplements -pyrimethamine -sulfinpyrazone -tricyclic antidepressants -warfarin This list may not describe all possible interactions. Give your health care provider a list of all the medicines, herbs, non-prescription drugs, or dietary supplements you use. Also tell them if you smoke, drink alcohol, or use illegal drugs. Some items may interact with your medicine. What should I watch for while using this medicine? Tell your doctor or health care professional if your symptoms do not improve. Drink several glasses of water a day to reduce the risk of kidney problems. Do not treat diarrhea with over the counter products. Contact your doctor if you have diarrhea that lasts more than 2 days or if it is severe and watery. This medicine can make you more sensitive to the sun. Keep out of the sun. If you cannot avoid being in the sun, wear protective clothing and use a sunscreen. Do not use sun lamps or tanning beds/booths. What side effects may I notice from receiving this medicine? Side effects that you should report to your doctor or health care professional as soon as possible: -allergic reactions like skin rash or hives, swelling of the face, lips, or tongue -breathing problems -fever or chills, sore throat -irregular heartbeat, chest pain -joint or muscle pain -pain or difficulty passing urine -red pinpoint spots on skin -redness, blistering, peeling or loosening of the skin, including inside the mouth -unusual bleeding or bruising -unusually weak or tired -  yellowing of the eyes or skin Side effects that usually do not  require medical attention (report to your doctor or health care professional if they continue or are bothersome): -diarrhea -dizziness -headache -loss of appetite -nausea, vomiting -nervousness This list may not describe all possible side effects. Call your doctor for medical advice about side effects. You may report side effects to FDA at 1-800-FDA-1088. Where should I keep my medicine? Keep out of the reach of children. Store at room temperature between 20 to 25 degrees C (68 to 77 degrees F). Protect from light. Throw away any unused medicine after the expiration date. NOTE: This sheet is a summary. It may not cover all possible information. If you have questions about this medicine, talk to your doctor, pharmacist, or health care provider.  2012, [employer]. (05/17/2008 11:32:51 AM)Levothyroxine tablets What is this medicine? LEVOTHYROXINE (lee voe thye ROX een) is a thyroid hormone. This medicine can improve symptoms of thyroid deficiency such as slow speech, lack of energy, weight gain, hair loss, dry skin, and feeling cold. It also helps to treat goiter (an enlarged thyroid gland). It is also used to treat some kinds of thyroid cancer along with surgery and other medicines. This medicine may be used for other purposes; ask your health care provider or pharmacist if you have questions. What should I tell my health care provider before I take this medicine? They need to know if you have any of these conditions: -angina -blood clotting problems -diabetes -dieting or on a weight loss program -fertility problems -heart disease -high levels of thyroid hormone -pituitary gland problem -previous heart attack -an unusual or allergic reaction to levothyroxine, thyroid hormones, other medicines, foods, dyes, or preservatives -pregnant or trying to get pregnant -breast-feeding How should I use this medicine? Take this medicine by mouth with plenty of water. It is best to take on  an empty stomach, at least 30 minutes before or 2 hours after food. Follow the directions on the prescription label. Take at the same time each day. Do not take your medicine more often than directed. Contact your pediatrician regarding the use of this medicine in children. While this drug may be prescribed for children and infants as young as a few days of age for selected conditions, precautions do apply. For infants, you may crush the tablet and place in a small amount of (5-10 ml or 1 to 2 teaspoonfuls) of water, breast milk, or non-soy based infant formula. Do not mix with soy-based infant formula. Give as directed. Overdosage: If you think you have taken too much of this medicine contact a poison control center or emergency room at once. NOTE: This medicine is only for you. Do not share this medicine with others. What if I miss a dose? If you miss a dose, take it as soon as you can. If it is almost time for your next dose, take only that dose. Do not take double or extra doses. What may interact with this medicine? -amiodarone -antacids -anti-thyroid medicines -calcium supplements -carbamazepine -cholestyramine -colestipol -digoxin -male hormones, including contraceptive or birth control pills -iron supplements -ketamine -liquid nutrition products like Ensure -medicines for colds and breathing difficulties -medicines for diabetes -medicines for mental depression -medicines or herbals used to decrease weight or appetite -phenobarbital or other barbiturate medications -phenytoin -prednisone or other corticosteroids -rifabutin -rifampin -soy isoflavones -sucralfate -theophylline -warfarin This list may not describe all possible interactions. Give your health care provider a list of all the medicines, herbs, non-prescription drugs,  or dietary supplements you use. Also tell them if you smoke, drink alcohol, or use illegal drugs. Some items may interact with your medicine. What  should I watch for while using this medicine? Be sure to take this medicine with plenty of fluids. Some tablets may cause choking, gagging, or difficulty swallowing from the tablet getting stuck in your throat. Most of these problems disappear if the medicine is taken with the right amount of water or other fluids. Do not switch brands of this medicine unless your health care professional agrees with the change. Ask questions if you are uncertain. You will need regular exams and occasional blood tests to check the response to treatment. If you are receiving this medicine for an underactive thyroid, it may be several weeks before you notice an improvement. Check with your doctor or health care professional if your symptoms do not improve. It may be necessary for you to take this medicine for the rest of your life. Do not stop using this medicine unless your doctor or health care professional advises you to. This medicine can affect blood sugar levels. If you have diabetes, check your blood sugar as directed. You may lose some of your hair when you first start treatment. With time, this usually corrects itself. If you are going to have surgery, tell your doctor or health care professional that you are taking this medicine. What side effects may I notice from receiving this medicine? Side effects that you should report to your doctor or health care professional as soon as possible: -allergic reactions like skin rash, itching or hives, swelling of the face, lips, or tongue -chest pain -excessive sweating or intolerance to heat -fast or irregular heartbeat -nervousness -skin rash or hives -swelling of ankles, feet, or legs -tremors Side effects that usually do not require medical attention (report to your doctor or health care professional if they continue or are bothersome): -changes in appetite -changes in menstrual periods -diarrhea -hair loss -headache -trouble sleeping -weight loss This  list may not describe all possible side effects. Call your doctor for medical advice about side effects. You may report side effects to FDA at 1-800-FDA-1088. Where should I keep my medicine? Keep out of the reach of children. Store at room temperature between 15 and 30 degrees C (59 and 86 degrees F). Protect from light and moisture. Keep container tightly closed. Throw away any unused medicine after the expiration date. NOTE: This sheet is a summary. It may not cover all possible information. If you have questions about this medicine, talk to your doctor, pharmacist, or health care provider.  2012, [employer] Standard. (12/22/2008 2:28:07 PM)Lisinopril tablets What is this medicine? LISINOPRIL (lyse IN oh pril) is an ACE inhibitor. This medicine is used to treat high blood pressure and heart failure. It is also used to protect the heart immediately after a heart attack. This medicine may be used for other purposes; ask your health care provider or pharmacist if you have questions. What should I tell my health care provider before I take this medicine? They need to know if you have any of these conditions: -diabetes -heart or blood vessel disease -immune system disease like lupus or scleroderma -kidney disease -low blood pressure -previous swelling of the tongue, face, or lips with difficulty breathing, difficulty swallowing, hoarseness, or tightening of the throat -an unusual or allergic reaction to lisinopril, other ACE inhibitors, insect venom, foods, dyes, or preservatives -pregnant or trying to get pregnant -breast-feeding How should I use this medicine?  Take this medicine by mouth with a glass of water. Follow the directions on your prescription label. You may take this medicine with or without food. Take your medicine at regular intervals. Do not stop taking this medicine except on the advice of your doctor or health care professional. Talk to your pediatrician regarding the use of  this medicine in children. Special care may be needed. While this drug may be prescribed for children as young as 42 years of age for selected conditions, precautions do apply. Overdosage: If you think you have taken too much of this medicine contact a poison control center or emergency room at once. NOTE: This medicine is only for you. Do not share this medicine with others. What if I miss a dose? If you miss a dose, take it as soon as you can. If it is almost time for your next dose, take only that dose. Do not take double or extra doses. What may interact with this medicine? -diuretics -lithium -NSAIDs, medicines for pain and inflammation, like ibuprofen or naproxen -over-the-counter herbal supplements like hawthorn -potassium salts or potassium supplements -salt substitutes This list may not describe all possible interactions. Give your health care provider a list of all the medicines, herbs, non-prescription drugs, or dietary supplements you use. Also tell them if you smoke, drink alcohol, or use illegal drugs. Some items may interact with your medicine. What should I watch for while using this medicine? Visit your doctor or health care professional for regular check ups. Check your blood pressure as directed. Ask your doctor what your blood pressure should be, and when you should contact him or her. Call your doctor or health care professional if you notice an irregular or fast heart beat. Women should inform their doctor if they wish to become pregnant or think they might be pregnant. There is a potential for serious side effects to an unborn child. Talk to your health care professional or pharmacist for more information. Check with your doctor or health care professional if you get an attack of severe diarrhea, nausea and vomiting, or if you sweat a lot. The loss of too much body fluid can make it dangerous for you to take this medicine. You may get drowsy or dizzy. Do not drive, use  machinery, or do anything that needs mental alertness until you know how this drug affects you. Do not stand or sit up quickly, especially if you are an older patient. This reduces the risk of dizzy or fainting spells. Alcohol can make you more drowsy and dizzy. Avoid alcoholic drinks. Avoid salt substitutes unless you are told otherwise by your doctor or health care professional. Do not treat yourself for coughs, colds, or pain while you are taking this medicine without asking your doctor or health care professional for advice. Some ingredients may increase your blood pressure. What side effects may I notice from receiving this medicine? Side effects that you should report to your doctor or health care professional as soon as possible: -abdominal pain with or without nausea or vomiting -allergic reactions like skin rash or hives, swelling of the hands, feet, face, lips, throat, or tongue -dark urine -difficulty breathing -dizzy, lightheaded or fainting spell -fever or sore throat -irregular heart beat, chest pain -pain or difficulty passing urine -redness, blistering, peeling or loosening of the skin, including inside the mouth -unusually weak -yellowing of the eyes or skin Side effects that usually do not require medical attention (report to your doctor or health care professional if  they continue or are bothersome): -change in taste -cough -decreased sexual function or desire -headache -sun sensitivity -tiredness This list may not describe all possible side effects. Call your doctor for medical advice about side effects. You may report side effects to FDA at 1-800-FDA-1088. Where should I keep my medicine? Keep out of the reach of children. Store at room temperature between 15 and 30 degrees C (59 and 86 degrees F). Protect from moisture. Keep container tightly closed. Throw away any unused medicine after the expiration date. NOTE: This sheet is a summary. It may not cover all possible  information. If you have questions about this medicine, talk to your doctor, pharmacist, or health care provider.  2012, [employer] Standard. (03/20/2008 5:36:32 PM)Cephalexin tablets or capsules What is this medicine? CEPHALEXIN (sef a LEX in) is a cephalosporin antibiotic. It is used to treat certain kinds of bacterial infections It will not work for colds, flu, or other viral infections. This medicine may be used for other purposes; ask your health care provider or pharmacist if you have questions. What should I tell my health care provider before I take this medicine? They need to know if you have any of these conditions: -kidney disease -stomach or intestine problems, especially colitis -an unusual or allergic reaction to cephalexin, other cephalosporins, penicillins, other antibiotics, medicines, foods, dyes or preservatives -pregnant or trying to get pregnant -breast-feeding How should I use this medicine? Take this medicine by mouth with a full glass of water. Follow the directions on the prescription label. This medicine can be taken with or without food. Take your medicine at regular intervals. Do not take your medicine more often than directed. Take all of your medicine as directed even if you think you are better. Do not skip doses or stop your medicine early. Talk to your pediatrician regarding the use of this medicine in children. While this drug may be prescribed for selected conditions, precautions do apply. Overdosage: If you think you have taken too much of this medicine contact a poison control center or emergency room at once. NOTE: This medicine is only for you. Do not share this medicine with others. What if I miss a dose? If you miss a dose, take it as soon as you can. If it is almost time for your next dose, take only that dose. Do not take double or extra doses. There should be at least 4 to 6 hours between doses. What may interact with this  medicine? -probenecid -some other antibiotics This list may not describe all possible interactions. Give your health care provider a list of all the medicines, herbs, non-prescription drugs, or dietary supplements you use. Also tell them if you smoke, drink alcohol, or use illegal drugs. Some items may interact with your medicine. What should I watch for while using this medicine? Tell your doctor or health care professional if your symptoms do not begin to improve in a few days. Do not treat diarrhea with over the counter products. Contact your doctor if you have diarrhea that lasts more than 2 days or if it is severe and watery. If you have diabetes, you may get a false-positive result for sugar in your urine. Check with your doctor or health care professional. What side effects may I notice from receiving this medicine? Side effects that you should report to your doctor or health care professional as soon as possible: -allergic reactions like skin rash, itching or hives, swelling of the face, lips, or tongue -breathing problems -pain  or trouble passing urine -redness, blistering, peeling or loosening of the skin, including inside the mouth -severe or watery diarrhea -unusually weak or tired -yellowing of the eyes, skin Side effects that usually do not require medical attention (report to your doctor or health care professional if they continue or are bothersome): -gas or heartburn -genital or anal irritation -headache -joint or muscle pain -nausea, vomiting This list may not describe all possible side effects. Call your doctor for medical advice about side effects. You may report side effects to FDA at 1-800-FDA-1088. Where should I keep my medicine? Keep out of the reach of children. Store at room temperature between 59 and 86 degrees F (15 and 30 degrees C). Throw away any unused medicine after the expiration date. NOTE: This sheet is a summary. It may not cover all possible  information. If you have questions about this medicine, talk to your doctor, pharmacist, or health care provider.  2012, [employer] Standard. (12/20/2007 5:09:13 PM)

## 2012-03-01 NOTE — Progress Notes (Signed)
UR Chart Review Completed  

## 2012-03-03 LAB — CULTURE, BLOOD (ROUTINE X 2)
Culture: NO GROWTH
Culture: NO GROWTH

## 2013-04-16 ENCOUNTER — Encounter (HOSPITAL_COMMUNITY): Payer: Self-pay

## 2013-04-16 ENCOUNTER — Emergency Department (HOSPITAL_COMMUNITY)
Admission: EM | Admit: 2013-04-16 | Discharge: 2013-04-16 | Disposition: A | Discharge status: Home / Self Care | Payer: Medicaid Other | Attending: Emergency Medicine | Admitting: Emergency Medicine

## 2013-04-16 DIAGNOSIS — M79675 Pain in left toe(s): Secondary | ICD-10-CM

## 2013-04-16 DIAGNOSIS — J45909 Unspecified asthma, uncomplicated: Secondary | ICD-10-CM | POA: Insufficient documentation

## 2013-04-16 DIAGNOSIS — E7800 Pure hypercholesterolemia, unspecified: Secondary | ICD-10-CM | POA: Insufficient documentation

## 2013-04-16 DIAGNOSIS — M2540 Effusion, unspecified joint: Secondary | ICD-10-CM | POA: Insufficient documentation

## 2013-04-16 DIAGNOSIS — F17200 Nicotine dependence, unspecified, uncomplicated: Secondary | ICD-10-CM | POA: Insufficient documentation

## 2013-04-16 DIAGNOSIS — Z79899 Other long term (current) drug therapy: Secondary | ICD-10-CM | POA: Insufficient documentation

## 2013-04-16 DIAGNOSIS — M129 Arthropathy, unspecified: Secondary | ICD-10-CM | POA: Insufficient documentation

## 2013-04-16 DIAGNOSIS — Z7982 Long term (current) use of aspirin: Secondary | ICD-10-CM | POA: Insufficient documentation

## 2013-04-16 DIAGNOSIS — Z791 Long term (current) use of non-steroidal anti-inflammatories (NSAID): Secondary | ICD-10-CM | POA: Insufficient documentation

## 2013-04-16 DIAGNOSIS — I10 Essential (primary) hypertension: Secondary | ICD-10-CM | POA: Insufficient documentation

## 2013-04-16 DIAGNOSIS — K219 Gastro-esophageal reflux disease without esophagitis: Secondary | ICD-10-CM | POA: Insufficient documentation

## 2013-04-16 DIAGNOSIS — M79609 Pain in unspecified limb: Secondary | ICD-10-CM | POA: Insufficient documentation

## 2013-04-16 DIAGNOSIS — M2550 Pain in unspecified joint: Secondary | ICD-10-CM | POA: Insufficient documentation

## 2013-04-16 MED ORDER — DICLOFENAC SODIUM 75 MG PO TBEC: 75.0000 mg | DELAYED_RELEASE_TABLET | Freq: Two times a day (BID) | ORAL | Status: DC

## 2013-04-16 MED ORDER — DEXAMETHASONE 6 MG PO TABS: ORAL_TABLET | ORAL | Status: DC

## 2013-04-16 MED ORDER — HYDROCODONE-ACETAMINOPHEN 7.5-325 MG PO TABS
1.0000 | ORAL_TABLET | ORAL | Status: DC | PRN
Start: 2013-04-16 — End: 2013-06-22

## 2013-04-16 NOTE — ED Notes (Signed)
Patient c/o L great toe and dorsal foot pain beginning last night, keeping him awake.  Has taken prescription arthritis medication and OTC Tylenol arthritis med w/out relief.  States he occasionally has same symptoms in bilateral ankles shoulders and knees.

## 2013-04-16 NOTE — ED Notes (Signed)
Complain of pain and swelling in left foot.

## 2013-04-16 NOTE — ED Provider Notes (Signed)
History    CSN: [id number] Arrival date & time 04/16/13  2440  First MD Initiated Contact with Patient 04/16/13 1024     Chief Complaint  Patient presents with  . Foot Pain   (Consider location/radiation/quality/duration/timing/severity/associated sxs/prior Treatment) Patient is a 47 y.o. male presenting with lower extremity pain. The history is provided by the patient.  Foot Pain This is a new problem. The current episode started yesterday. The problem occurs constantly. The problem has been gradually worsening. Associated symptoms include arthralgias and joint swelling. Pertinent negatives include no fever. Exacerbated by: movement and palpation. He has tried nothing for the symptoms. The treatment provided no relief.   Past Medical History  Diagnosis Date  . High blood pressure   . Asthma   . Arthritis   . Acid reflux disease   . High cholesterol    Past Surgical History  Procedure Laterality Date  . Joint replacement    . Total hip arthroplasty     No family history on file. History  Substance Use Topics  . Smoking status: Current Every Day Smoker    Types: Cigarettes  . Smokeless tobacco: Not on file  . Alcohol Use: No    Review of Systems  Constitutional: Negative for fever.  Musculoskeletal: Positive for joint swelling and arthralgias.    Allergies  Review of patient's allergies indicates no known allergies.  Home Medications   Current Outpatient Rx  Name  Route  Sig  Dispense  Refill  . albuterol (PROVENTIL HFA;VENTOLIN HFA) 108 (90 BASE) MCG/ACT inhaler   Inhalation   Inhale 2 puffs into the lungs 4 (four) times daily. For shortness of breath or wheezing         . aspirin EC 81 MG tablet   Oral   Take 81 mg by mouth daily.         Marland Kitchen atorvastatin (LIPITOR) 40 MG tablet   Oral   Take 40 mg by mouth daily.         Marland Kitchen levothyroxine (SYNTHROID, LEVOTHROID) 50 MCG tablet   Oral   Take 1 tablet (50 mcg total) by mouth daily before breakfast.  30 tablet   5   . lisinopril (PRINIVIL,ZESTRIL) 20 MG tablet   Oral   Take 1 tablet (20 mg total) by mouth daily.   30 tablet   3   . naproxen (NAPROSYN) 500 MG tablet   Oral   Take 500 mg by mouth 2 (two) times daily with a meal.         . omeprazole (PRILOSEC) 20 MG capsule   Oral   Take 20 mg by mouth daily.         Marland Kitchen oxyCODONE-acetaminophen (PERCOCET/ROXICET) 5-325 MG per tablet   Oral   Take 1 tablet by mouth every 8 (eight) hours.          BP 137/87  Pulse 82  Temp(Src) 97.8 F (36.6 C) (Oral)  Resp 20  Ht 5\' 9"  (1.753 m)  Wt 235 lb (106.595 kg)  BMI 34.69 kg/m2  SpO2 98% Physical Exam  Nursing note and vitals reviewed. Constitutional: He is oriented to person, place, and time. He appears well-developed and well-nourished.  Non-toxic appearance.  HENT:  Head: Normocephalic.  Right Ear: Tympanic membrane and external ear normal.  Left Ear: Tympanic membrane and external ear normal.  Eyes: EOM and lids are normal. Pupils are equal, round, and reactive to light.  Neck: Normal range of motion. Neck supple. Carotid bruit is not  present.  Cardiovascular: Normal rate, regular rhythm, normal heart sounds, intact distal pulses and normal pulses.   Pulmonary/Chest: Breath sounds normal. No respiratory distress.  Abdominal: Soft. Bowel sounds are normal. There is no tenderness. There is no guarding.  Musculoskeletal: Normal range of motion.       Feet:  Pain with attempted flex of the left 1st toe. Pain to palpation. No laceration or puncture noted.  No lesion between the toes. Fungal nail infection, but on ingrown nail noted.   Lymphadenopathy:       Head (right side): No submandibular adenopathy present.       Head (left side): No submandibular adenopathy present.    He has no cervical adenopathy.  Neurological: He is alert and oriented to person, place, and time. He has normal strength. No cranial nerve deficit or sensory deficit.  Skin: Skin is warm and dry.    Psychiatric: He has a normal mood and affect. His speech is normal.    ED Course  Procedures (including critical care time) Labs Reviewed - No data to display No results found. No diagnosis found.  MDM  **I have reviewed nursing notes, vital signs, and all appropriate lab and imaging results for this patient.* Pt has pain at the DIP of the left 1st toe. He has had this problem in the past and the possibility of Gout was suggested. No reported or noted injury. Pt to be treated with voltaren and norco. He will see his PCP for additional evaluation and testing.  Kathie Dike, PA-C 04/23/13 [phone]

## 2013-05-02 NOTE — ED Provider Notes (Signed)
Medical screening examination/treatment/procedure(s) were performed by non-physician practitioner and as supervising physician I was immediately available for consultation/collaboration.   Zarek Relph W. Leightyn Cina, MD 05/02/13 0000 

## 2013-06-22 ENCOUNTER — Encounter (HOSPITAL_COMMUNITY): Payer: Self-pay | Admitting: Emergency Medicine

## 2013-06-22 ENCOUNTER — Emergency Department (HOSPITAL_COMMUNITY)
Admission: EM | Admit: 2013-06-22 | Discharge: 2013-06-22 | Disposition: A | Discharge status: Home / Self Care | Payer: Medicaid Other | Attending: Emergency Medicine | Admitting: Emergency Medicine

## 2013-06-22 ENCOUNTER — Emergency Department (HOSPITAL_COMMUNITY): Payer: Medicaid Other

## 2013-06-22 DIAGNOSIS — R609 Edema, unspecified: Secondary | ICD-10-CM | POA: Insufficient documentation

## 2013-06-22 DIAGNOSIS — Z79899 Other long term (current) drug therapy: Secondary | ICD-10-CM | POA: Insufficient documentation

## 2013-06-22 DIAGNOSIS — M129 Arthropathy, unspecified: Secondary | ICD-10-CM | POA: Insufficient documentation

## 2013-06-22 DIAGNOSIS — J45909 Unspecified asthma, uncomplicated: Secondary | ICD-10-CM | POA: Insufficient documentation

## 2013-06-22 DIAGNOSIS — K219 Gastro-esophageal reflux disease without esophagitis: Secondary | ICD-10-CM | POA: Insufficient documentation

## 2013-06-22 DIAGNOSIS — E7800 Pure hypercholesterolemia, unspecified: Secondary | ICD-10-CM | POA: Insufficient documentation

## 2013-06-22 DIAGNOSIS — M79604 Pain in right leg: Secondary | ICD-10-CM

## 2013-06-22 DIAGNOSIS — F17200 Nicotine dependence, unspecified, uncomplicated: Secondary | ICD-10-CM | POA: Insufficient documentation

## 2013-06-22 DIAGNOSIS — M79605 Pain in left leg: Secondary | ICD-10-CM

## 2013-06-22 DIAGNOSIS — Z7982 Long term (current) use of aspirin: Secondary | ICD-10-CM | POA: Insufficient documentation

## 2013-06-22 LAB — CBC WITH DIFFERENTIAL/PLATELET
Lymphocytes Relative: 25 % (ref 12–46)
Lymphs Abs: 1.9 10*3/uL (ref 0.7–4.0)
Neutro Abs: 4.6 10*3/uL (ref 1.7–7.7)
Neutrophils Relative %: 61 % (ref 43–77)
Platelets: 349 10*3/uL (ref 150–400)
RBC: 3.91 MIL/uL — ABNORMAL LOW (ref 4.22–5.81)
WBC: 7.6 10*3/uL (ref 4.0–10.5)

## 2013-06-22 LAB — PROTIME-INR
INR: 1.05 (ref 0.00–1.49)
Prothrombin Time: 13.5 seconds (ref 11.6–15.2)

## 2013-06-22 LAB — BASIC METABOLIC PANEL
CO2: 31 mEq/L (ref 19–32)
Chloride: 98 mEq/L (ref 96–112)
GFR calc non Af Amer: >90 mL/min (ref >90)
Glucose, Bld: 103 mg/dL — ABNORMAL HIGH (ref 70–99)
Potassium: 3.8 mEq/L (ref 3.5–5.1)
Sodium: 137 mEq/L (ref 135–145)

## 2013-06-22 MED ORDER — IOHEXOL 350 MG/ML SOLN
100.0000 mL | Freq: Once | INTRAVENOUS | Status: AC | PRN
  Administered 2013-06-22: 100 mL via INTRAVENOUS

## 2013-06-22 MED ORDER — IOHEXOL 350 MG/ML SOLN: 100.0000 mL | Freq: Once | INTRAVENOUS | Status: DC | PRN

## 2013-06-22 MED ORDER — ENOXAPARIN SODIUM 100 MG/ML ~~LOC~~ SOLN
1.0000 mg/kg | Freq: Once | SUBCUTANEOUS | Status: AC
  Administered 2013-06-22: 115 mg via SUBCUTANEOUS
  Filled 2013-06-22: qty 2

## 2013-06-22 MED ORDER — CLINDAMYCIN HCL 150 MG PO CAPS: 300.0000 mg | ORAL_CAPSULE | Freq: Four times a day (QID) | ORAL | Status: DC

## 2013-06-22 NOTE — ED Notes (Addendum)
Pt taken to CT but lab work not back by the time pt got over to lab. Lab called by CT and reported would be a while longer before other blood work resulted. Pt aware of delay and verbalized understanding.

## 2013-06-22 NOTE — ED Notes (Addendum)
Patient c/o swelling in hips, lower legs, and ankles. Spinal fusion on 06/06/13 by Dr Channing Mutters was sleeping when he felt a burning pain in the right hip that "made him jump out of bed" then the swelling started the next day when he woke up. Patient seen PCP on Thursday and told that he should walk around and the swelling would go do. Per patient swelling has gotten progressively worse with walking.

## 2013-06-22 NOTE — ED Notes (Signed)
Called CT and let them know patient is ready to return for scan.

## 2013-06-22 NOTE — ED Provider Notes (Signed)
CSN: [id number]     Arrival date & time 06/22/13  1716 History   First MD Initiated Contact with Patient 06/22/13 1719     Chief Complaint  Patient presents with  . Leg Swelling   (Consider location/radiation/quality/duration/timing/severity/associated sxs/prior Treatment) HPI Pt with history as below had spinal fusion done in [place] about 2-3 weeks ago. He has had progressively worsening bilateral lower extremity swelling since that time. Complaining of moderate burning pain to his feet due to swelling. Denies CP, SOB. No fever or difficulty urinating. He reports both the spine surgeon and his doctor told him to 'get up and walk around some more' however that seems to be making things worse. Has had occasional fleeting pain in hips bilaterally over the last few weeks, but does not seem to be correlated with the swelling. 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  Past Medical History  Diagnosis Date  . High blood pressure   . Asthma   . Arthritis   . Acid reflux disease   . High cholesterol    Past Surgical History  Procedure Laterality Date  . Joint replacement    . Total hip arthroplasty    . Spinal fusion     History reviewed. No pertinent family history. History  Substance Use Topics  . Smoking status: Current Every Day Smoker -- 0.50 packs/day for 20 years    Types: Cigarettes  . Smokeless tobacco: Never Used  . Alcohol Use: No    Review of Systems All other systems reviewed and are negative except as noted in HPI.   Allergies  Review of patient's allergies indicates no known allergies.  Home Medications   Current Outpatient Rx  Name  Route  Sig  Dispense  Refill  . albuterol (PROVENTIL HFA;VENTOLIN HFA) 108 (90 BASE) MCG/ACT inhaler   Inhalation   Inhale 2 puffs into the lungs 4 (four) times daily. For shortness of breath or wheezing         . aspirin EC 81 MG tablet   Oral   Take 81 mg by mouth daily.         Marland Kitchen atorvastatin  (LIPITOR) 40 MG tablet   Oral   Take 40 mg by mouth daily.         Marland Kitchen dexamethasone (DECADRON) 6 MG tablet      1 po bid with food   12 tablet   0   . diclofenac (VOLTAREN) 75 MG EC tablet   Oral   Take 1 tablet (75 mg total) by mouth 2 (two) times daily.   12 tablet   0   . HYDROcodone-acetaminophen (NORCO) 7.5-325 MG per tablet   Oral   Take 1 tablet by mouth every 4 (four) hours as needed for pain.   20 tablet   0   . EXPIRED: levothyroxine (SYNTHROID, LEVOTHROID) 50 MCG tablet   Oral   Take 1 tablet (50 mcg total) by mouth daily before breakfast.   30 tablet   5   . EXPIRED: lisinopril (PRINIVIL,ZESTRIL) 20 MG tablet   Oral   Take 1 tablet (20 mg total) by mouth daily.   30 tablet   3   . naproxen (NAPROSYN) 500 MG tablet   Oral   Take 500 mg by mouth 2 (two) times daily with a meal.         . omeprazole (PRILOSEC) 20 MG capsule   Oral   Take 20 mg by mouth daily.         Marland Kitchen  oxyCODONE-acetaminophen (PERCOCET/ROXICET) 5-325 MG per tablet   Oral   Take 1 tablet by mouth every 8 (eight) hours.          BP 137/70  Pulse 104  Temp(Src) 98.6 F (37 C) (Oral)  Resp 20  Ht 5\' 7"  (1.702 m)  Wt 258 lb (117.028 kg)  BMI 40.4 kg/m2  SpO2 100% Physical Exam  Nursing note and vitals reviewed. Constitutional: He is oriented to person, place, and time. He appears well-developed and well-nourished.  HENT:  Head: Normocephalic and atraumatic.  Eyes: EOM are normal. Pupils are equal, round, and reactive to light.  Neck: Normal range of motion. Neck supple.  Cardiovascular: Normal rate, normal heart sounds and intact distal pulses.   Pulmonary/Chest: Effort normal and breath sounds normal.  Abdominal: Bowel sounds are normal. He exhibits no distension. There is no tenderness.  Musculoskeletal: Normal range of motion. He exhibits edema (2+ edema of bilateral LE from feet to knees with mild erythema and warmth) and tenderness.  Neurological: He is alert and  oriented to person, place, and time. He has normal strength. No cranial nerve deficit or sensory deficit.  Skin: Skin is warm and dry. No rash noted.  Psychiatric: He has a normal mood and affect.    ED Course  Procedures (including critical care time) Labs Review Labs Reviewed  CBC WITH DIFFERENTIAL - Abnormal; Notable for the following:    RBC 3.91 (*)    Hemoglobin 12.2 (*)    HCT 36.7 (*)    All other components within normal limits  BASIC METABOLIC PANEL - Abnormal; Notable for the following:    Glucose, Bld 103 (*)    All other components within normal limits  APTT - Abnormal; Notable for the following:    aPTT 38 (*)    All other components within normal limits  TROPONIN I  PROTIME-INR   Imaging Review Ct Angio Chest Pe W/cm &/or Wo Cm  06/22/2013   CLINICAL DATA:  Leg swelling, tachycardia, recent surgery  EXAM: CT ANGIOGRAPHY CHEST WITH CONTRAST  TECHNIQUE: Multidetector CT imaging of the chest was performed using the standard protocol during bolus administration of intravenous contrast. Multiplanar CT image reconstructions including MIPs were obtained to evaluate the vascular anatomy.  CONTRAST:  OMNIPAQUE IOHEXOL 350 MG/ML SOLN  COMPARISON:  None.  FINDINGS: Sagittal images of the spine shows no destructive bony lesions. Sagittal view of the sternum is unremarkable. Central airways are patent. Small amount of air is noted in upper esophagus. There is mild thickening of distal esophageal wall. Gastroesophageal reflux disease is suspected. Clinical correlation is necessary.  Heart size is within normal limits. No pericardial effusion.  Visualized upper abdomen shows mild hepatic fatty infiltration. No adrenal gland mass is noted.  No pulmonary embolus is noted.  Images of the lung parenchyma shows no acute infiltrate or pulmonary edema. There is no focal consolidation. No pulmonary nodules are noted. A right hilar lymph node measures 7 mm not pathologic by size criteria. Left  hilar lymph node measures 7.5 mm not pathologic by size criteria. No axillary adenopathy.  Review of the MIP images confirms the above findings.  IMPRESSION: 1. No pulmonary embolus is noted. 2. No acute infiltrate or pulmonary edema. 3. Mild hepatic fatty infiltration. 4. There is mild thickening of distal esophageal wall. This may be due to gastroesophageal reflux disease. Clinical correlation is necessary.   Electronically Signed   By: Natasha Mead   On: 06/22/2013 20:20    MDM  1. Peripheral edema     Pt with bilateral LE edema but recent surgery raises concern for DVT. [place] is not available at this time. He does not have CP or SOB, but with mild tachycardia and DVT symptoms will send for CTA. If neg, will still need DVT rule out as well.   ECG interpretation   Date: 06/22/2013  Rate: 97  Rhythm: normal sinus rhythm  QRS Axis: normal  Intervals: normal  ST/T Wave abnormalities: normal  Conduction Disutrbances: none  Narrative Interpretation:   Old EKG Reviewed: none available  8:33 PM Labs and imaging reviewed and unremarkable. Will give Lovenox and scheduled for outpatient [place] tomorrow morning. Pt may also have early cellulitis but unusual to be bilateral. I will give Rx for Clindamycin but advised him to wait until after the [place] to get it filled.      Charles B. Bernette Mayers, MD 06/22/13 2036

## 2013-06-22 NOTE — ED Notes (Signed)
Patient returned from CT, personnel states IV infiltrated and was removed in CT. States they tried to insert new IV x 2 but was unsuccessful.

## 2013-06-23 ENCOUNTER — Ambulatory Visit (HOSPITAL_COMMUNITY)
Admit: 2013-06-23 | Discharge: 2013-06-23 | Disposition: A | Discharge status: Home / Self Care | Payer: Medicaid Other | Source: Ambulatory Visit | Attending: Emergency Medicine | Admitting: Emergency Medicine

## 2013-06-23 DIAGNOSIS — M79609 Pain in unspecified limb: Secondary | ICD-10-CM | POA: Insufficient documentation

## 2013-06-23 DIAGNOSIS — M79604 Pain in right leg: Secondary | ICD-10-CM

## 2013-06-23 NOTE — ED Provider Notes (Signed)
Ultrasound bilateral lower extremities negative for DVT  Donnetta Hutching, MD 06/23/13 1318

## 2013-09-24 ENCOUNTER — Encounter (HOSPITAL_COMMUNITY): Payer: Self-pay | Admitting: Emergency Medicine

## 2013-09-24 ENCOUNTER — Emergency Department (HOSPITAL_COMMUNITY)
Admission: EM | Admit: 2013-09-24 | Discharge: 2013-09-24 | Disposition: A | Discharge status: Home / Self Care | Payer: Medicaid Other | Attending: Emergency Medicine | Admitting: Emergency Medicine

## 2013-09-24 DIAGNOSIS — E7800 Pure hypercholesterolemia, unspecified: Secondary | ICD-10-CM | POA: Insufficient documentation

## 2013-09-24 DIAGNOSIS — J45909 Unspecified asthma, uncomplicated: Secondary | ICD-10-CM | POA: Insufficient documentation

## 2013-09-24 DIAGNOSIS — M109 Gout, unspecified: Secondary | ICD-10-CM | POA: Insufficient documentation

## 2013-09-24 DIAGNOSIS — F17200 Nicotine dependence, unspecified, uncomplicated: Secondary | ICD-10-CM | POA: Insufficient documentation

## 2013-09-24 DIAGNOSIS — K219 Gastro-esophageal reflux disease without esophagitis: Secondary | ICD-10-CM | POA: Insufficient documentation

## 2013-09-24 DIAGNOSIS — I10 Essential (primary) hypertension: Secondary | ICD-10-CM | POA: Insufficient documentation

## 2013-09-24 DIAGNOSIS — Z792 Long term (current) use of antibiotics: Secondary | ICD-10-CM | POA: Insufficient documentation

## 2013-09-24 DIAGNOSIS — Z79899 Other long term (current) drug therapy: Secondary | ICD-10-CM | POA: Insufficient documentation

## 2013-09-24 HISTORY — DX: Gout, unspecified: M10.9

## 2013-09-24 MED ORDER — INDOMETHACIN 25 MG PO CAPS: 25.0000 mg | ORAL_CAPSULE | Freq: Two times a day (BID) | ORAL | Status: DC

## 2013-09-24 NOTE — ED Notes (Signed)
Pt c/o pain to left great toe that started today.  Reports has history of gout.

## 2013-09-24 NOTE — ED Provider Notes (Signed)
CSN: [id number]     Arrival date & time 09/24/13  1318 History   First MD Initiated Contact with Patient 09/24/13 1559     Chief Complaint  Patient presents with  . Toe Pain   (Consider location/radiation/quality/duration/timing/severity/associated sxs/prior Treatment) HPI Comments: Patient presents to the emergency department with chief complaint of left great toe pain. He states that he has a history of gout. States the pain started today. He denies any known mechanism of injury. Denies any fevers or chills. He has not tried taking anything to alleviate his symptoms. There are no aggravating or alleviating factors. Does not having any allergies.  The history is provided by the patient. No language interpreter was used.    Past Medical History  Diagnosis Date  . High blood pressure   . Asthma   . Arthritis   . Acid reflux disease   . High cholesterol   . Gout    Past Surgical History  Procedure Laterality Date  . Joint replacement    . Total hip arthroplasty    . Spinal fusion     No family history on file. History  Substance Use Topics  . Smoking status: Current Every Day Smoker -- 0.50 packs/day for 20 years    Types: Cigarettes  . Smokeless tobacco: Never Used  . Alcohol Use: No    Review of Systems  All other systems reviewed and are negative.    Allergies  Review of patient's allergies indicates no known allergies.  Home Medications   Current Outpatient Rx  Name  Route  Sig  Dispense  Refill  . albuterol (PROVENTIL HFA;VENTOLIN HFA) 108 (90 BASE) MCG/ACT inhaler   Inhalation   Inhale 2 puffs into the lungs 4 (four) times daily. For shortness of breath or wheezing         . atorvastatin (LIPITOR) 40 MG tablet   Oral   Take 40 mg by mouth daily.         . clindamycin (CLEOCIN) 150 MG capsule   Oral   Take 2 capsules (300 mg total) by mouth 4 (four) times daily.   56 capsule   0   . gabapentin (NEURONTIN) 300 MG capsule   Oral   Take 300 mg by  mouth at bedtime.         Marland Kitchen levothyroxine (SYNTHROID, LEVOTHROID) 50 MCG tablet   Oral   Take 50 mcg by mouth daily before breakfast.         . lisinopril (PRINIVIL,ZESTRIL) 20 MG tablet   Oral   Take 20 mg by mouth daily.         Marland Kitchen omeprazole (PRILOSEC) 20 MG capsule   Oral   Take 20 mg by mouth daily.         . Oxycodone HCl 10 MG TABS   Oral   Take 10 mg by mouth every 6 (six) hours as needed. pain          BP 147/80  Pulse 86  Temp(Src) 97.8 F (36.6 C) (Oral)  Resp 18  Ht 5\' 7"  (1.702 m)  Wt 235 lb (106.595 kg)  BMI 36.80 kg/m2  SpO2 98% Physical Exam  Nursing note and vitals reviewed. Constitutional: He is oriented to person, place, and time. He appears well-developed and well-nourished.  HENT:  Head: Normocephalic and atraumatic.  Eyes: Conjunctivae and EOM are normal.  Neck: Normal range of motion.  Cardiovascular: Normal rate and intact distal pulses.   Intact distal pulses with brisk capillary  refill  Pulmonary/Chest: Effort normal.  Abdominal: He exhibits no distension.  Musculoskeletal: Normal range of motion.  Left great toe moderately tender to palpation, no bony abnormality or deformity, range of motion and strength 5/5  Neurological: He is alert and oriented to person, place, and time.  Sensation intact  Skin: Skin is dry.  No erythema, or signs of infection  Psychiatric: He has a normal mood and affect. His behavior is normal. Judgment and thought content normal.    ED Course  Procedures (including critical care time) Labs Review Labs Reviewed - No data to display Imaging Review No results found.  EKG Interpretation   None       MDM   1. Gout     Patient with complaint of left great toe pain. He believes that this is gout. He denies any known mechanism of injury. I will treat him with indomethacin, and have given him dietary restrictions. Patient is stable and ready for discharge. Followup with primary care  provider.   Roxy Horseman, PA-C 09/24/13 [phone]

## 2013-09-24 NOTE — ED Provider Notes (Signed)
Medical screening examination/treatment/procedure(s) were performed by non-physician practitioner and as supervising physician I was immediately available for consultation/collaboration.  EKG Interpretation   None         Ilee Randleman L Davinci Glotfelty, MD 09/24/13 2348 

## 2014-01-02 ENCOUNTER — Emergency Department (HOSPITAL_COMMUNITY)
Admission: EM | Admit: 2014-01-02 | Discharge: 2014-01-02 | Discharge status: Against Medical Advice | Payer: Medicaid Other | Attending: Emergency Medicine | Admitting: Emergency Medicine

## 2014-01-02 ENCOUNTER — Encounter (HOSPITAL_COMMUNITY): Payer: Self-pay | Admitting: Emergency Medicine

## 2014-01-02 DIAGNOSIS — I10 Essential (primary) hypertension: Secondary | ICD-10-CM | POA: Insufficient documentation

## 2014-01-02 DIAGNOSIS — Z8739 Personal history of other diseases of the musculoskeletal system and connective tissue: Secondary | ICD-10-CM | POA: Insufficient documentation

## 2014-01-02 DIAGNOSIS — Z8719 Personal history of other diseases of the digestive system: Secondary | ICD-10-CM | POA: Insufficient documentation

## 2014-01-02 DIAGNOSIS — F17200 Nicotine dependence, unspecified, uncomplicated: Secondary | ICD-10-CM | POA: Insufficient documentation

## 2014-01-02 DIAGNOSIS — M79609 Pain in unspecified limb: Secondary | ICD-10-CM | POA: Insufficient documentation

## 2014-01-02 DIAGNOSIS — Z8639 Personal history of other endocrine, nutritional and metabolic disease: Secondary | ICD-10-CM | POA: Insufficient documentation

## 2014-01-02 DIAGNOSIS — J45909 Unspecified asthma, uncomplicated: Secondary | ICD-10-CM | POA: Insufficient documentation

## 2014-01-02 DIAGNOSIS — Z862 Personal history of diseases of the blood and blood-forming organs and certain disorders involving the immune mechanism: Secondary | ICD-10-CM | POA: Insufficient documentation

## 2014-01-02 NOTE — ED Notes (Signed)
Pt arrives with c/o of left foot pain, edema and redness present.Pain started two days ago, noticed swelling yesterday morning. Left ankle warmer than right side. A/Ox4, no acute distress present.

## 2014-01-18 ENCOUNTER — Emergency Department (HOSPITAL_COMMUNITY)
Admission: EM | Admit: 2014-01-18 | Discharge: 2014-01-18 | Disposition: A | Discharge status: Home / Self Care | Payer: Medicaid Other | Attending: Emergency Medicine | Admitting: Emergency Medicine

## 2014-01-18 ENCOUNTER — Encounter (HOSPITAL_COMMUNITY): Payer: Self-pay | Admitting: Emergency Medicine

## 2014-01-18 DIAGNOSIS — R52 Pain, unspecified: Secondary | ICD-10-CM | POA: Insufficient documentation

## 2014-01-18 DIAGNOSIS — Z8739 Personal history of other diseases of the musculoskeletal system and connective tissue: Secondary | ICD-10-CM | POA: Insufficient documentation

## 2014-01-18 DIAGNOSIS — E7800 Pure hypercholesterolemia, unspecified: Secondary | ICD-10-CM | POA: Insufficient documentation

## 2014-01-18 DIAGNOSIS — Z79899 Other long term (current) drug therapy: Secondary | ICD-10-CM | POA: Insufficient documentation

## 2014-01-18 DIAGNOSIS — G8929 Other chronic pain: Secondary | ICD-10-CM | POA: Insufficient documentation

## 2014-01-18 DIAGNOSIS — K219 Gastro-esophageal reflux disease without esophagitis: Secondary | ICD-10-CM | POA: Insufficient documentation

## 2014-01-18 DIAGNOSIS — M545 Low back pain, unspecified: Secondary | ICD-10-CM | POA: Insufficient documentation

## 2014-01-18 DIAGNOSIS — F17200 Nicotine dependence, unspecified, uncomplicated: Secondary | ICD-10-CM | POA: Insufficient documentation

## 2014-01-18 DIAGNOSIS — J45909 Unspecified asthma, uncomplicated: Secondary | ICD-10-CM | POA: Insufficient documentation

## 2014-01-18 DIAGNOSIS — M109 Gout, unspecified: Secondary | ICD-10-CM | POA: Insufficient documentation

## 2014-01-18 DIAGNOSIS — Z9889 Other specified postprocedural states: Secondary | ICD-10-CM | POA: Insufficient documentation

## 2014-01-18 MED ORDER — OXYCODONE HCL 10 MG PO TABS: 10.0000 mg | ORAL_TABLET | Freq: Four times a day (QID) | ORAL | Status: DC | PRN

## 2014-01-18 MED ORDER — HYDROMORPHONE HCL PF 2 MG/ML IJ SOLN
2.0000 mg | Freq: Once | INTRAMUSCULAR | Status: AC
  Administered 2014-01-18: 2 mg via INTRAMUSCULAR
  Filled 2014-01-18: qty 1

## 2014-01-18 NOTE — ED Notes (Addendum)
Per EMS, pt. Has chronic back pain. Pt. Reports increased lower back pain today. Pt. Given 800mg  Ibuprofen by EMS. Pt. Denies any new injury.

## 2014-01-18 NOTE — ED Notes (Signed)
MD at bedside. 

## 2014-01-18 NOTE — Discharge Instructions (Signed)
Oxycodone as prescribed as needed for pain.  Follow up with Dr. Channing Muttersoy as scheduled this week.   Chronic Back Pain  When back pain lasts longer than 3 months, it is called chronic back pain.People with chronic back pain often go through certain periods that are more intense (flare-ups).  CAUSES Chronic back pain can be caused by wear and tear (degeneration) on different structures in your back. These structures include:  The bones of your spine (vertebrae) and the joints surrounding your spinal cord and nerve roots (facets).  The strong, fibrous tissues that connect your vertebrae (ligaments). Degeneration of these structures may result in pressure on your nerves. This can lead to constant pain. HOME CARE INSTRUCTIONS  Avoid bending, heavy lifting, prolonged sitting, and activities which make the problem worse.  Take brief periods of rest throughout the day to reduce your pain. Lying down or standing usually is better than sitting while you are resting.  Take over-the-counter or prescription medicines only as directed by your caregiver. SEEK IMMEDIATE MEDICAL CARE IF:   You have weakness or numbness in one of your legs or feet.  You have trouble controlling your bladder or bowels.  You have nausea, vomiting, abdominal pain, shortness of breath, or fainting. Document Released: 10/23/2004 Document Revised: 12/08/2011 Document Reviewed: 08/30/2011 [facility] LLCExit[facility] Patient Information 2014 [place], [place].

## 2014-01-18 NOTE — ED Provider Notes (Signed)
CSN: [id number]     Arrival date & time 01/18/14  0026 History  This chart was scribed for Geoffery Lyonsouglas Milah Recht, MD by Bennett Scrapehristina Taylor, ED Scribe. This patient was seen in room APA07/APA07 and the patient's care was started at 12:54 AM.    Chief Complaint  Patient presents with  . Back Pain    The history is provided by the patient. No language interpreter was used.   HPI Comments: [name] is a 48 y.o. male with a h/o bilateral hip replacements who presents to the Emergency Department by ambulance  complaining of worsening of chronic lower back pain that radiates around his hips and down his legs for past 2 days. He states that it feels as if a  "rubber band is squeezing around my waist" causing his hips to feel numb. Pt states he has h/o back surgery that took place in September 2014 and has experienced continued pain since then. He states that he has been on strict no lifting, twisting or bending regimen. He denies any recent falls or injuries. Pt states he has a prescription for 10mg  oxycodone and admits that he recently ran out this evening. He states that he is scheduled to have the prescription refilled tomorrow. Pt denies any associated urinary or bowel symptoms.   Dr. Heloise OchoaMark Ray is pain management doctor   Past Medical History  Diagnosis Date  . High blood pressure   . Asthma   . Arthritis   . Acid reflux disease   . High cholesterol   . Gout    Past Surgical History  Procedure Laterality Date  . Joint replacement    . Total hip arthroplasty    . Spinal fusion     No family history on file. History  Substance Use Topics  . Smoking status: Current Every Day Smoker -- 0.50 packs/day for 20 years    Types: Cigarettes  . Smokeless tobacco: Never Used  . Alcohol Use: No    Review of Systems  A complete 10 system review of systems was obtained and all systems are negative except as noted in the HPI and PMH.    Allergies  Review of patient's allergies indicates no known  allergies.  Home Medications   Prior to Admission medications   Medication Sig Start Date End Date Taking? Authorizing Provider  albuterol (PROVENTIL HFA;VENTOLIN HFA) 108 (90 BASE) MCG/ACT inhaler Inhale 2 puffs into the lungs 4 (four) times daily. For shortness of breath or wheezing    Historical Provider, MD  atorvastatin (LIPITOR) 40 MG tablet Take 40 mg by mouth daily.    Historical Provider, MD  clindamycin (CLEOCIN) 150 MG capsule Take 2 capsules (300 mg total) by mouth 4 (four) times daily. 06/22/13   Charles B. Bernette MayersSheldon, MD  gabapentin (NEURONTIN) 300 MG capsule Take 300 mg by mouth at bedtime.    Historical Provider, MD  indomethacin (INDOCIN) 25 MG capsule Take 1 capsule (25 mg total) by mouth 2 (two) times daily with a meal. 09/24/13   Roxy Horsemanobert Browning, PA-C  levothyroxine (SYNTHROID, LEVOTHROID) 50 MCG tablet Take 50 mcg by mouth daily before breakfast.    Historical Provider, MD  lisinopril (PRINIVIL,ZESTRIL) 20 MG tablet Take 20 mg by mouth daily.    Historical Provider, MD  omeprazole (PRILOSEC) 20 MG capsule Take 20 mg by mouth daily.    Historical Provider, MD  Oxycodone HCl 10 MG TABS Take 10 mg by mouth every 6 (six) hours as needed. pain    Historical  Provider, MD   Triage Vitals: BP 132/81  Pulse 87  Temp(Src) 97.7 F (36.5 C) (Oral)  Resp 16  Ht 5\' 7"  (1.702 m)  Wt 238 lb (107.956 kg)  BMI 37.27 kg/m2  SpO2 97%  Physical Exam  Nursing note and vitals reviewed. Constitutional: He is oriented to person, place, and time. He appears well-developed and well-nourished. No distress.  HENT:  Head: Normocephalic and atraumatic.  Eyes: Conjunctivae and EOM are normal.  Neck: Normal range of motion. Neck supple. No tracheal deviation present.  Cardiovascular: Normal rate, regular rhythm and normal heart sounds.   No murmur heard. Pulmonary/Chest: Effort normal and breath sounds normal. No respiratory distress. He has no wheezes. He has no rales.  Abdominal: Soft. Bowel  sounds are normal. There is no tenderness.  Musculoskeletal: Normal range of motion. He exhibits tenderness. He exhibits no edema.  Tenderness to palpation in soft tissues of upper lumbar region.  Neurological: He is alert and oriented to person, place, and time. No cranial nerve deficit.  DTR's are 2+ and equal in bilateral lower extremities Strength is 5/5 and symmetrical in BLE  Skin: Skin is warm and dry.  Psychiatric: He has a normal mood and affect. His behavior is normal.    ED Course  Procedures (including critical care time)  Medications  HYDROmorphone (DILAUDID) injection 2 mg (2 mg Intramuscular Given 01/18/14 0112)    DIAGNOSTIC STUDIES: Oxygen Saturation is 97% on RA, Adequate by my interpretation.    COORDINATION OF CARE: 1:00 AM-Discussed treatment plan which includes pain medication with pt at bedside and pt agreed to plan.     Labs Review Labs Reviewed - No data to display  Imaging Review No results found.   EKG Interpretation None      MDM   Final diagnoses:  None    Patient is a 48 year old male who presents with complaints of low back pain. He has a history of chronic low back pain and is out of his pain medications. He denies any new injury or trauma. His physical examination reveals a nonfocal neurologic exam and there are no red flags that would indicate that the need for an emergent MRI or surgical condition is present. He was given an injection for his discomfort and will be discharged with oxycodone which he normally takes but is out of. He tells me has an appointment with Dr. Tia Maskerowley who is his spine surgeon in [place] on Thursday and assures me he will keep this appointment.   I personally performed the services described in this documentation, which was scribed in my presence. The recorded information has been reviewed and is accurate.      Geoffery Lyonsouglas Aldora Perman, MD 01/18/14 [phone]

## 2014-01-18 NOTE — ED Notes (Signed)
Per EMS: pt describes as a rubber band around my waist.

## 2014-11-11 ENCOUNTER — Emergency Department (HOSPITAL_COMMUNITY)
Admission: EM | Admit: 2014-11-11 | Discharge: 2014-11-11 | Disposition: A | Discharge status: Home / Self Care | Payer: Medicaid Other | Attending: Emergency Medicine | Admitting: Emergency Medicine

## 2014-11-11 ENCOUNTER — Encounter (HOSPITAL_COMMUNITY): Payer: Self-pay | Admitting: Emergency Medicine

## 2014-11-11 DIAGNOSIS — M1990 Unspecified osteoarthritis, unspecified site: Secondary | ICD-10-CM | POA: Diagnosis not present

## 2014-11-11 DIAGNOSIS — R21 Rash and other nonspecific skin eruption: Secondary | ICD-10-CM | POA: Diagnosis not present

## 2014-11-11 DIAGNOSIS — E780 Pure hypercholesterolemia: Secondary | ICD-10-CM | POA: Diagnosis not present

## 2014-11-11 DIAGNOSIS — M109 Gout, unspecified: Secondary | ICD-10-CM | POA: Insufficient documentation

## 2014-11-11 DIAGNOSIS — Z720 Tobacco use: Secondary | ICD-10-CM | POA: Diagnosis not present

## 2014-11-11 DIAGNOSIS — J45909 Unspecified asthma, uncomplicated: Secondary | ICD-10-CM | POA: Diagnosis not present

## 2014-11-11 DIAGNOSIS — Z79899 Other long term (current) drug therapy: Secondary | ICD-10-CM | POA: Diagnosis not present

## 2014-11-11 DIAGNOSIS — K219 Gastro-esophageal reflux disease without esophagitis: Secondary | ICD-10-CM | POA: Insufficient documentation

## 2014-11-11 MED ORDER — HYDROXYZINE HCL 25 MG PO TABS
25.0000 mg | ORAL_TABLET | Freq: Once | ORAL | Status: AC
  Administered 2014-11-11: 25 mg via ORAL
  Filled 2014-11-11: qty 1

## 2014-11-11 MED ORDER — PERMETHRIN 5 % EX CREA
1.0000 | TOPICAL_CREAM | Freq: Once | CUTANEOUS | Status: DC
Start: 2014-11-11 — End: 2015-03-15

## 2014-11-11 MED ORDER — HYDROXYZINE HCL 25 MG PO TABS: 25.0000 mg | ORAL_TABLET | Freq: Four times a day (QID) | ORAL | Status: DC

## 2014-11-11 MED ORDER — PREDNISONE 50 MG PO TABS
60.0000 mg | ORAL_TABLET | Freq: Once | ORAL | Status: AC
  Administered 2014-11-11: 60 mg via ORAL
  Filled 2014-11-11 (×2): qty 1

## 2014-11-11 MED ORDER — TRIAMCINOLONE ACETONIDE 0.1 % EX CREA: 1.0000 "application " | TOPICAL_CREAM | Freq: Two times a day (BID) | CUTANEOUS | Status: DC

## 2014-11-11 NOTE — Discharge Instructions (Signed)
Please wash linen and clothing daily.  Use a matress cover.  Please vacuum all carpet and couches. Please use mediations and creams as suggested. Vistaril may cause drowsiness, use with  Caution. Please see Dr Margo AyeHall for additional evaluation if not improving.

## 2014-11-11 NOTE — ED Provider Notes (Signed)
CSN: [id number]     Arrival date & time 11/11/14  [id number] History   First MD Initiated Contact with Patient 11/11/14 1857     Chief Complaint  Patient presents with  . Rash     (Consider location/radiation/quality/duration/timing/severity/associated sxs/prior Treatment) Patient is a 49 y.o. male presenting with rash. The history is provided by the patient.  Rash Location:  Shoulder/arm, torso, leg and ano-genital Shoulder/arm rash location:  L arm and R arm Torso rash location:  Upper back, lower back, L chest and R chest Ano-genital rash location:  Groin Leg rash location:  L leg and R leg Quality: itchiness   Quality: not blistering, not bruising, not draining, not scaling and not weeping   Severity:  Moderate Onset quality:  Gradual Timing:  Intermittent Progression:  Worsening Chronicity:  Recurrent Context comment:  Sleeping on old matress and sheets. Relieved by:  Nothing Worsened by:  Nothing tried Ineffective treatments:  None tried Associated symptoms: joint pain   Associated symptoms: no abdominal pain, no fever, no induration, no shortness of breath, no throat swelling, no tongue swelling, not vomiting and not wheezing     Past Medical History  Diagnosis Date  . High blood pressure   . Asthma   . Arthritis   . Acid reflux disease   . High cholesterol   . Gout    Past Surgical History  Procedure Laterality Date  . Joint replacement    . Total hip arthroplasty    . Spinal fusion     Family History  Problem Relation Age of Onset  . Diabetes Other    History  Substance Use Topics  . Smoking status: Current Every Day Smoker -- 0.50 packs/day for 20 years    Types: Cigarettes  . Smokeless tobacco: Never Used  . Alcohol Use: No    Review of Systems  Constitutional: Negative for fever and activity change.       All ROS Neg except as noted in HPI  Eyes: Negative for photophobia and discharge.  Respiratory: Negative for cough, shortness of breath and  wheezing.   Cardiovascular: Negative for chest pain and palpitations.  Gastrointestinal: Negative for vomiting, abdominal pain and blood in stool.  Genitourinary: Negative for dysuria, frequency and hematuria.  Musculoskeletal: Positive for arthralgias. Negative for back pain and neck pain.  Skin: Positive for rash.  Neurological: Negative for dizziness, seizures and speech difficulty.  Psychiatric/Behavioral: Negative for hallucinations and confusion.      Allergies  Review of patient's allergies indicates no known allergies.  Home Medications   Prior to Admission medications   Medication Sig Start Date End Date Taking? Authorizing Provider  albuterol (PROVENTIL HFA;VENTOLIN HFA) 108 (90 BASE) MCG/ACT inhaler Inhale 2 puffs into the lungs 4 (four) times daily. For shortness of breath or wheezing    Historical Provider, MD  atorvastatin (LIPITOR) 40 MG tablet Take 40 mg by mouth daily.    Historical Provider, MD  clindamycin (CLEOCIN) 150 MG capsule Take 2 capsules (300 mg total) by mouth 4 (four) times daily. 06/22/13   Charles B. Bernette MayersSheldon, MD  gabapentin (NEURONTIN) 300 MG capsule Take 300 mg by mouth at bedtime.    Historical Provider, MD  indomethacin (INDOCIN) 25 MG capsule Take 1 capsule (25 mg total) by mouth 2 (two) times daily with a meal. 09/24/13   Roxy Horsemanobert Browning, PA-C  levothyroxine (SYNTHROID, LEVOTHROID) 50 MCG tablet Take 50 mcg by mouth daily before breakfast.    Historical Provider, MD  lisinopril (PRINIVIL,ZESTRIL) 20  MG tablet Take 20 mg by mouth daily.    Historical Provider, MD  omeprazole (PRILOSEC) 20 MG capsule Take 20 mg by mouth daily.    Historical Provider, MD  Oxycodone HCl 10 MG TABS Take 10 mg by mouth every 6 (six) hours as needed. pain    Historical Provider, MD  Oxycodone HCl 10 MG TABS Take 1 tablet (10 mg total) by mouth every 6 (six) hours as needed. 01/18/14   Geoffery Lyons, MD   BP 148/85 mmHg  Pulse 91  Temp(Src) 97.6 F (36.4 C) (Oral)  Resp 16   Ht  (1.702 m)  Wt 245 lb (111.131 kg)  BMI 38.36 kg/m2  SpO2 100% Physical Exam  Constitutional: He is oriented to person, place, and time. He appears well-developed and well-nourished.  Non-toxic appearance.  HENT:  Head: Normocephalic.  Right Ear: Tympanic membrane and external ear normal.  Left Ear: Tympanic membrane and external ear normal.  Eyes: EOM and lids are normal. Pupils are equal, round, and reactive to light.  Neck: Normal range of motion. Neck supple. Carotid bruit is not present.  Cardiovascular: Normal rate, regular rhythm, normal heart sounds, intact distal pulses and normal pulses.   Pulmonary/Chest: Breath sounds normal. No respiratory distress.  Abdominal: Soft. Bowel sounds are normal. There is no tenderness. There is no guarding.  Musculoskeletal: Normal range of motion.  Lymphadenopathy:       Head (right side): No submandibular adenopathy present.       Head (left side): No submandibular adenopathy present.    He has no cervical adenopathy.  Neurological: He is alert and oriented to person, place, and time. He has normal strength. No cranial nerve deficit or sensory deficit.  Skin: Skin is warm and dry. Rash noted.  Rash at multiple sites c/w  Bed bugs. No red streaks or signs of secondary infection.  Psychiatric: He has a normal mood and affect. His speech is normal.  Nursing note and vitals reviewed.   ED Course  Procedures (including critical care time) Labs Review Labs Reviewed - No data to display  Imaging Review No results found.   EKG Interpretation None      MDM Blood pressure is elevated,o/w vital signs are within normal limits. Pulse ox 100% on room air. wnl by my interpretation. Exam is c/w bed bugs. Pt's bed pardner has similar rash.  Rx for triamcinolone, vistaril and elimite given.   Final diagnoses:  None    **I have reviewed nursing notes, vital signs, and all appropriate lab and imaging results for this  patient.Kathie Dike, PA-C 11/11/14 2030  Nelia Shi, MD 11/13/14 2112

## 2014-11-11 NOTE — ED Notes (Signed)
Pt c/o rash with itching. Roommate has same.

## 2014-11-11 NOTE — ED Notes (Signed)
Scattered, singular, puritic pink-red, papular lesions scattered primarily over groin, buttocks and (according to patient) perineum.

## 2014-11-11 NOTE — ED Notes (Signed)
Patient with no complaints at this time. Respirations even and unlabored. Skin warm/dry. Discharge instructions reviewed with patient at this time. Patient given opportunity to voice concerns/ask questions. Patient discharged at this time and left Emergency Department with steady gait.   

## 2015-02-24 ENCOUNTER — Emergency Department (HOSPITAL_COMMUNITY)
Admission: EM | Admit: 2015-02-24 | Discharge: 2015-02-24 | Disposition: A | Discharge status: Home / Self Care | Payer: Medicaid Other | Attending: Emergency Medicine | Admitting: Emergency Medicine

## 2015-02-24 ENCOUNTER — Emergency Department (HOSPITAL_COMMUNITY): Payer: Medicaid Other

## 2015-02-24 ENCOUNTER — Encounter (HOSPITAL_COMMUNITY): Payer: Self-pay | Admitting: Emergency Medicine

## 2015-02-24 DIAGNOSIS — J45909 Unspecified asthma, uncomplicated: Secondary | ICD-10-CM | POA: Diagnosis not present

## 2015-02-24 DIAGNOSIS — X58XXXA Exposure to other specified factors, initial encounter: Secondary | ICD-10-CM | POA: Insufficient documentation

## 2015-02-24 DIAGNOSIS — Y998 Other external cause status: Secondary | ICD-10-CM | POA: Insufficient documentation

## 2015-02-24 DIAGNOSIS — Y9389 Activity, other specified: Secondary | ICD-10-CM | POA: Insufficient documentation

## 2015-02-24 DIAGNOSIS — M109 Gout, unspecified: Secondary | ICD-10-CM | POA: Insufficient documentation

## 2015-02-24 DIAGNOSIS — M1990 Unspecified osteoarthritis, unspecified site: Secondary | ICD-10-CM | POA: Diagnosis not present

## 2015-02-24 DIAGNOSIS — K219 Gastro-esophageal reflux disease without esophagitis: Secondary | ICD-10-CM | POA: Insufficient documentation

## 2015-02-24 DIAGNOSIS — E780 Pure hypercholesterolemia: Secondary | ICD-10-CM | POA: Diagnosis not present

## 2015-02-24 DIAGNOSIS — Z720 Tobacco use: Secondary | ICD-10-CM | POA: Insufficient documentation

## 2015-02-24 DIAGNOSIS — R21 Rash and other nonspecific skin eruption: Secondary | ICD-10-CM

## 2015-02-24 DIAGNOSIS — S86911A Strain of unspecified muscle(s) and tendon(s) at lower leg level, right leg, initial encounter: Secondary | ICD-10-CM | POA: Diagnosis not present

## 2015-02-24 DIAGNOSIS — S8391XA Sprain of unspecified site of right knee, initial encounter: Secondary | ICD-10-CM | POA: Diagnosis not present

## 2015-02-24 DIAGNOSIS — Y9289 Other specified places as the place of occurrence of the external cause: Secondary | ICD-10-CM | POA: Diagnosis not present

## 2015-02-24 DIAGNOSIS — S8991XA Unspecified injury of right lower leg, initial encounter: Secondary | ICD-10-CM | POA: Diagnosis present

## 2015-02-24 DIAGNOSIS — Z79899 Other long term (current) drug therapy: Secondary | ICD-10-CM | POA: Insufficient documentation

## 2015-02-24 MED ORDER — PREDNISONE 10 MG PO TABS: 20.0000 mg | ORAL_TABLET | Freq: Every day | ORAL | Status: DC

## 2015-02-24 MED ORDER — METHYLPREDNISOLONE SODIUM SUCC 125 MG IJ SOLR
125.0000 mg | Freq: Once | INTRAMUSCULAR | Status: AC
  Administered 2015-02-24: 125 mg via INTRAMUSCULAR
  Filled 2015-02-24: qty 2

## 2015-02-24 NOTE — ED Provider Notes (Signed)
CSN: [id number]     Arrival date & time 02/24/15  1114 History   First MD Initiated Contact with Patient 02/24/15 1431     Chief Complaint  Patient presents with  . Rash  . Knee Pain     (Consider location/radiation/quality/duration/timing/severity/associated sxs/prior Treatment) Patient is a 49 y.o. male presenting with rash and knee pain. The history is provided by the patient (the pt has psoriasis and gets a rash to his arms and abdomen when he is not on prednisone.  he also has some knee pain).  Rash Location: arms, legs and abd. Quality: not blistering   Severity:  Moderate Onset quality:  Gradual Timing:  Constant Chronicity:  Recurrent Context: not animal contact   Associated symptoms: no abdominal pain, no diarrhea, no fatigue and no headaches   Knee Pain Associated symptoms: no back pain and no fatigue     Past Medical History  Diagnosis Date  . High blood pressure   . Asthma   . Arthritis   . Acid reflux disease   . High cholesterol   . Gout    Past Surgical History  Procedure Laterality Date  . Joint replacement    . Total hip arthroplasty    . Spinal fusion     Family History  Problem Relation Age of Onset  . Diabetes Other    History  Substance Use Topics  . Smoking status: Current Every Day Smoker -- 0.50 packs/day for 20 years    Types: Cigarettes  . Smokeless tobacco: Never Used  . Alcohol Use: No    Review of Systems  Constitutional: Negative for appetite change and fatigue.  HENT: Negative for congestion, ear discharge and sinus pressure.   Eyes: Negative for discharge.  Respiratory: Negative for cough.   Cardiovascular: Negative for chest pain.  Gastrointestinal: Negative for abdominal pain and diarrhea.  Genitourinary: Negative for frequency and hematuria.  Musculoskeletal: Negative for back pain.       Right knee pain  Skin: Positive for rash.  Neurological: Negative for seizures and headaches.  Psychiatric/Behavioral: Negative for  hallucinations.      Allergies  Review of patient's allergies indicates no known allergies.  Home Medications   Prior to Admission medications   Medication Sig Start Date End Date Taking? Authorizing Provider  atorvastatin (LIPITOR) 40 MG tablet Take 40 mg by mouth daily.   Yes Historical Provider, MD  gabapentin (NEURONTIN) 300 MG capsule Take 300 mg by mouth daily.    Yes Historical Provider, MD  levothyroxine (SYNTHROID, LEVOTHROID) 50 MCG tablet Take 50 mcg by mouth daily before breakfast.   Yes Historical Provider, MD  lisinopril (PRINIVIL,ZESTRIL) 20 MG tablet Take 20 mg by mouth daily.   Yes Historical Provider, MD  omeprazole (PRILOSEC) 20 MG capsule Take 20 mg by mouth daily.   Yes Historical Provider, MD  oxyCODONE (OXY IR/ROXICODONE) 5 MG immediate release tablet Take 5 mg by mouth every 6 (six) hours as needed for moderate pain.   Yes Historical Provider, MD  albuterol (PROVENTIL HFA;VENTOLIN HFA) 108 (90 BASE) MCG/ACT inhaler Inhale 2 puffs into the lungs 4 (four) times daily. For shortness of breath or wheezing    Historical Provider, MD  clindamycin (CLEOCIN) 150 MG capsule Take 2 capsules (300 mg total) by mouth 4 (four) times daily. Patient not taking: Reported on 02/24/2015 06/22/13   Susy Frizzleharles Sheldon, MD  hydrOXYzine (ATARAX/VISTARIL) 25 MG tablet Take 1 tablet (25 mg total) by mouth every 6 (six) hours. Patient not taking: Reported  on 02/24/2015 11/11/14   Ivery Quale, PA-C  indomethacin (INDOCIN) 25 MG capsule Take 1 capsule (25 mg total) by mouth 2 (two) times daily with a meal. Patient not taking: Reported on 02/24/2015 09/24/13   Roxy Horseman, PA-C  Oxycodone HCl 10 MG TABS Take 1 tablet (10 mg total) by mouth every 6 (six) hours as needed. Patient not taking: Reported on 02/24/2015 01/18/14   Geoffery Lyons, MD  permethrin (ELIMITE) 5 % cream Apply 1 application topically once. Patient not taking: Reported on 02/24/2015 11/11/14   Ivery Quale, PA-C  predniSONE  (DELTASONE) 10 MG tablet Take 2 tablets (20 mg total) by mouth daily. 02/24/15   Bethann Berkshire, MD  triamcinolone cream (KENALOG) 0.1 % Apply 1 application topically 2 (two) times daily. Patient not taking: Reported on 02/24/2015 11/11/14   Ivery Quale, PA-C   BP 145/100 mmHg  Pulse 94  Temp(Src) 99.1 F (37.3 C) (Oral)  Resp 16  Ht  (1.702 m)  Wt 228 lb (103.42 kg)  BMI 35.70 kg/m2  SpO2 99% Physical Exam  Constitutional: He is oriented to person, place, and time. He appears well-developed.  HENT:  Head: Normocephalic.  Eyes: Conjunctivae and EOM are normal. No scleral icterus.  Neck: Neck supple. No thyromegaly present.  Cardiovascular: Normal rate and regular rhythm.  Exam reveals no gallop and no friction rub.   No murmur heard. Pulmonary/Chest: No stridor. He has no wheezes. He has no rales. He exhibits no tenderness.  Abdominal: He exhibits no distension. There is no tenderness. There is no rebound.  Musculoskeletal: Normal range of motion. He exhibits no edema.  Tender lateral right knee.  Full range of motion  Lymphadenopathy:    He has no cervical adenopathy.  Neurological: He is oriented to person, place, and time. He exhibits normal muscle tone. Coordination normal.  Skin: No rash noted. No erythema.  Mac pap rash to arms legs and abd.  Bad psoriasis to elbows  Psychiatric: He has a normal mood and affect. His behavior is normal.    ED Course  Procedures (including critical care time) Labs Review Labs Reviewed - No data to display  Imaging Review Dg Knee Complete 4 Views Right  02/24/2015   CLINICAL DATA:  Patient c/o rash to entire body "for long time" (x7-8 months) in which his PCP usually gives him steroid in which clears the rash. Per patient rash itches and is worse with heat and more intense in creases. Patient has psoriasis. Patient also c/o right knee pain. Per patient twisted knee while walking yesterday.  EXAM: RIGHT KNEE - COMPLETE 4+ VIEW   COMPARISON:  None.  FINDINGS: There is no evidence of fracture, dislocation, or joint effusion. There is no evidence of arthropathy or other focal bone abnormality. Soft tissues are unremarkable.  IMPRESSION: Negative.   Electronically Signed   By: Amie Portland M.D.   On: 02/24/2015 13:21     EKG Interpretation None      MDM   Final diagnoses:  Rash  Knee sprain and strain, right, initial encounter    tx with prednisone and follow up.  Rest to knee    Bethann Berkshire, MD 02/24/15 [phone]

## 2015-02-24 NOTE — ED Notes (Signed)
Patient c/o rash to entire body "for long time" (x7-8 months) in which his PCP usually gives him steroid in which clears the rash. Per patient rash itches and is worse with heat and more intense in creases. Patient has psoriasis. Patient also c/o  right knee pain. Per patient twisted knee while walking yesterday.

## 2015-02-24 NOTE — Discharge Instructions (Signed)
Follow up with your md in 2 weeks for recheck.  Get seen sooner if not improving.

## 2015-03-15 ENCOUNTER — Encounter (HOSPITAL_COMMUNITY): Payer: Self-pay | Admitting: *Deleted

## 2015-03-15 ENCOUNTER — Emergency Department (HOSPITAL_COMMUNITY)
Admission: EM | Admit: 2015-03-15 | Discharge: 2015-03-15 | Disposition: A | Discharge status: Home / Self Care | Payer: Medicaid Other | Attending: Emergency Medicine | Admitting: Emergency Medicine

## 2015-03-15 DIAGNOSIS — Z79899 Other long term (current) drug therapy: Secondary | ICD-10-CM | POA: Diagnosis not present

## 2015-03-15 DIAGNOSIS — K219 Gastro-esophageal reflux disease without esophagitis: Secondary | ICD-10-CM | POA: Diagnosis not present

## 2015-03-15 DIAGNOSIS — E780 Pure hypercholesterolemia: Secondary | ICD-10-CM | POA: Diagnosis not present

## 2015-03-15 DIAGNOSIS — Z8739 Personal history of other diseases of the musculoskeletal system and connective tissue: Secondary | ICD-10-CM | POA: Insufficient documentation

## 2015-03-15 DIAGNOSIS — Z720 Tobacco use: Secondary | ICD-10-CM | POA: Insufficient documentation

## 2015-03-15 DIAGNOSIS — Z791 Long term (current) use of non-steroidal anti-inflammatories (NSAID): Secondary | ICD-10-CM | POA: Diagnosis not present

## 2015-03-15 DIAGNOSIS — Z792 Long term (current) use of antibiotics: Secondary | ICD-10-CM | POA: Diagnosis not present

## 2015-03-15 DIAGNOSIS — J45909 Unspecified asthma, uncomplicated: Secondary | ICD-10-CM | POA: Insufficient documentation

## 2015-03-15 DIAGNOSIS — B86 Scabies: Secondary | ICD-10-CM | POA: Diagnosis not present

## 2015-03-15 DIAGNOSIS — R21 Rash and other nonspecific skin eruption: Secondary | ICD-10-CM | POA: Diagnosis present

## 2015-03-15 MED ORDER — HYDROXYZINE HCL 25 MG PO TABS: 25.0000 mg | ORAL_TABLET | Freq: Four times a day (QID) | ORAL | Status: DC | PRN

## 2015-03-15 MED ORDER — PERMETHRIN 5 % EX CREA: TOPICAL_CREAM | CUTANEOUS | Status: DC

## 2015-03-15 NOTE — ED Provider Notes (Signed)
Diffuse scabbed rash - particulary at the waist line and in the intertriginous spaces - no systemic sx, is very prurutic, needs f/u with derm - tx as scabies - encouraged cleaning of linens etc, stable for d/c.  Medical screening examination/treatment/procedure(s) were conducted as a shared visit with non-physician practitioner(s) and myself.  I personally evaluated the patient during the encounter.  Clinical Impression:   Final diagnoses:  Scabies         Eber Hong, MD 03/17/15 [phone]

## 2015-03-15 NOTE — ED Notes (Signed)
Patient with no complaints at this time. Respirations even and unlabored. Skin warm/dry. Discharge instructions reviewed with patient at this time. Patient given opportunity to voice concerns/ask questions. Patient discharged at this time and left Emergency Department with steady gait.   

## 2015-03-15 NOTE — ED Notes (Signed)
Rash x several months. Pt states his PCP puts him on steroids and the rash becomes a little better but gets worse again. Rash all over, much worse between toes . Rash also present to hands and in between fingers. Friend has similar rash to hands and wrists who also lives in the house.

## 2015-03-15 NOTE — ED Provider Notes (Signed)
CSN: [id number]     Arrival date & time 03/15/15  1151 History   First MD Initiated Contact with Patient 03/15/15 1213     Chief Complaint  Patient presents with  . Rash     (Consider location/radiation/quality/duration/timing/severity/associated sxs/prior Treatment) HPI   [name] is a 49 y.o. male who presents to the Emergency Department complaining of diffuse,  Worsening rash to most of his body.  Rash has been persistent for several months.  He has been to his PCP and treated with steroids and antibiotics without resolution.  He states the rash improves slightly with steroids but returns as soon as the medication is finished.  He  Also r=describes intense itching and states that his room mate also has a similar rash to his wrists and arms.  He denies swelling, pain, fever or drainage.   Past Medical History  Diagnosis Date  . High blood pressure   . Asthma   . Arthritis   . Acid reflux disease   . High cholesterol   . Gout    Past Surgical History  Procedure Laterality Date  . Joint replacement    . Total hip arthroplasty    . Spinal fusion     Family History  Problem Relation Age of Onset  . Diabetes Other    History  Substance Use Topics  . Smoking status: Current Every Day Smoker -- 0.50 packs/day for 20 years    Types: Cigarettes  . Smokeless tobacco: Never Used  . Alcohol Use: No    Review of Systems  Constitutional: Negative for fever, chills, activity change and appetite change.  HENT: Negative for facial swelling, sore throat and trouble swallowing.   Respiratory: Negative for chest tightness, shortness of breath and wheezing.   Musculoskeletal: Negative for neck pain and neck stiffness.  Skin: Positive for rash. Negative for wound.  Neurological: Negative for dizziness, weakness, numbness and headaches.  All other systems reviewed and are negative.     Allergies  Review of patient's allergies indicates no known allergies.  Home  Medications   Prior to Admission medications   Medication Sig Start Date End Date Taking? Authorizing Provider  albuterol (PROVENTIL HFA;VENTOLIN HFA) 108 (90 BASE) MCG/ACT inhaler Inhale 2 puffs into the lungs 4 (four) times daily. For shortness of breath or wheezing    Historical Provider, MD  atorvastatin (LIPITOR) 40 MG tablet Take 40 mg by mouth daily.    Historical Provider, MD  gabapentin (NEURONTIN) 300 MG capsule Take 300 mg by mouth daily.     Historical Provider, MD  hydrOXYzine (ATARAX/VISTARIL) 25 MG tablet Take 1 tablet (25 mg total) by mouth every 6 (six) hours as needed for itching. 03/15/15   Tationna Fullard, PA-C  levothyroxine (SYNTHROID, LEVOTHROID) 50 MCG tablet Take 50 mcg by mouth daily before breakfast.    Historical Provider, MD  lisinopril (PRINIVIL,ZESTRIL) 20 MG tablet Take 20 mg by mouth daily.    Historical Provider, MD  omeprazole (PRILOSEC) 20 MG capsule Take 20 mg by mouth daily.    Historical Provider, MD  oxyCODONE (OXY IR/ROXICODONE) 5 MG immediate release tablet Take 5 mg by mouth every 6 (six) hours as needed for moderate pain.    Historical Provider, MD  Oxycodone HCl 10 MG TABS Take 1 tablet (10 mg total) by mouth every 6 (six) hours as needed. Patient not taking: Reported on 02/24/2015 01/18/14   Geoffery Lyons, MD  permethrin (ELIMITE) 5 % cream Apply from neck down, leave on 14  hrs then wash off.  May re-apply in one week if needed 03/15/15   Jady Braggs, PA-C  predniSONE (DELTASONE) 10 MG tablet Take 2 tablets (20 mg total) by mouth daily. 02/24/15   Bethann Berkshire, MD   BP 150/98 mmHg  Pulse 93  Temp(Src) 98.4 F (36.9 C) (Oral)  Resp 16  Ht 5\' 7"  (1.702 m)  Wt 238 lb (107.956 kg)  BMI 37.27 kg/m2  SpO2 98% Physical Exam  Constitutional: He is oriented to person, place, and time. He appears well-developed and well-nourished. No distress.  HENT:  Head: Normocephalic and atraumatic.  Mouth/Throat: Oropharynx is clear and moist.  Neck: Normal range  of motion. Neck supple.  Cardiovascular: Normal rate, regular rhythm, normal heart sounds and intact distal pulses.   No murmur heard. Pulmonary/Chest: Effort normal and breath sounds normal. No respiratory distress.  Musculoskeletal: He exhibits no edema or tenderness.  Lymphadenopathy:    He has no cervical adenopathy.  Neurological: He is alert and oriented to person, place, and time. He exhibits normal muscle tone. Coordination normal.  Skin: Skin is warm. Rash noted. There is erythema.  Diffuse small papular rash to most of the body including the groin and web spaces of the toes and fingers.  No drainage or edema.  Multiple excoriations    Nursing note and vitals reviewed.   ED Course  Procedures (including critical care time) Labs Review Labs Reviewed - No data to display  Imaging Review No results found.   EKG Interpretation None      MDM   Final diagnoses:  Scabies   Pt is well appearing.  Rash appears c/w scabies.  Pt also seen by Dr. Hyacinth Meeker and care plan discussed.  rx for permethrin cream and vistaril tabs for itching.  referral given for derm   Pauline Aus, PA-C 03/15/15 2155  Eber Hong, MD 03/17/15 [phone]

## 2015-03-15 NOTE — Discharge Instructions (Signed)

## 2015-05-05 ENCOUNTER — Emergency Department (HOSPITAL_COMMUNITY)
Admission: EM | Admit: 2015-05-05 | Discharge: 2015-05-05 | Disposition: A | Discharge status: Home / Self Care | Payer: Medicaid Other | Attending: Emergency Medicine | Admitting: Emergency Medicine

## 2015-05-05 ENCOUNTER — Encounter (HOSPITAL_COMMUNITY): Payer: Self-pay | Admitting: Emergency Medicine

## 2015-05-05 DIAGNOSIS — K219 Gastro-esophageal reflux disease without esophagitis: Secondary | ICD-10-CM | POA: Diagnosis not present

## 2015-05-05 DIAGNOSIS — E780 Pure hypercholesterolemia: Secondary | ICD-10-CM | POA: Diagnosis not present

## 2015-05-05 DIAGNOSIS — Z7952 Long term (current) use of systemic steroids: Secondary | ICD-10-CM | POA: Diagnosis not present

## 2015-05-05 DIAGNOSIS — M109 Gout, unspecified: Secondary | ICD-10-CM | POA: Insufficient documentation

## 2015-05-05 DIAGNOSIS — M1990 Unspecified osteoarthritis, unspecified site: Secondary | ICD-10-CM | POA: Diagnosis not present

## 2015-05-05 DIAGNOSIS — G5601 Carpal tunnel syndrome, right upper limb: Secondary | ICD-10-CM | POA: Diagnosis not present

## 2015-05-05 DIAGNOSIS — Z79899 Other long term (current) drug therapy: Secondary | ICD-10-CM | POA: Diagnosis not present

## 2015-05-05 DIAGNOSIS — J45909 Unspecified asthma, uncomplicated: Secondary | ICD-10-CM | POA: Diagnosis not present

## 2015-05-05 DIAGNOSIS — R200 Anesthesia of skin: Secondary | ICD-10-CM | POA: Diagnosis present

## 2015-05-05 DIAGNOSIS — Z720 Tobacco use: Secondary | ICD-10-CM | POA: Diagnosis not present

## 2015-05-05 NOTE — Discharge Instructions (Signed)
Carpal Tunnel Syndrome The carpal tunnel is a narrow area located on the palm side of your wrist. The tunnel is formed by the wrist bones and ligaments. Nerves, blood vessels, and tendons pass through the carpal tunnel. Repeated wrist motion or certain diseases may cause swelling within the tunnel. This swelling pinches the main nerve in the wrist (median nerve) and causes the painful hand and arm condition called carpal tunnel syndrome. CAUSES   Repeated wrist motions.  Wrist injuries.  Certain diseases like arthritis, diabetes, alcoholism, hyperthyroidism, and kidney failure.  Obesity.  Pregnancy. SYMPTOMS   A "pins and needles" feeling in your fingers or hand, especially in your thumb, index and middle fingers.  Tingling or numbness in your fingers or hand.  An aching feeling in your entire arm, especially when your wrist and elbow are bent for long periods of time.  Wrist pain that goes up your arm to your shoulder.  Pain that goes down into your palm or fingers.  A weak feeling in your hands. DIAGNOSIS  Your health care provider will take your history and perform a physical exam. An electromyography test may be needed. This test measures electrical signals sent out by your nerves into the muscles. The electrical signals are usually slowed by carpal tunnel syndrome. You may also need X-rays. TREATMENT  Carpal tunnel syndrome may clear up by itself. Your health care provider may recommend a wrist splint or medicine such as a nonsteroidal anti-inflammatory medicine. Cortisone injections may help. Sometimes, surgery may be needed to free the pinched nerve.  HOME CARE INSTRUCTIONS   Take all medicine as directed by your health care provider. Only take over-the-counter or prescription medicines for pain, discomfort, or fever as directed by your health care provider.  If you were given a splint to keep your wrist from bending, wear it as directed. It is important to wear the splint at  night. Wear the splint for as long as you have pain or numbness in your hand, arm, or wrist. This may take 1 to 2 months.  Rest your wrist from any activity that may be causing your pain. If your symptoms are work-related, you may need to talk to your employer about changing to a job that does not require using your wrist.  Put ice on your wrist after long periods of wrist activity.  Put ice in a plastic bag.  Place a towel between your skin and the bag.  Leave the ice on for 15-20 minutes, 03-04 times a day.  Keep all follow-up visits as directed by your health care provider. This includes any orthopedic referrals, physical therapy, and rehabilitation. Any delay in getting necessary care could result in a delay or failure of your condition to heal. SEEK IMMEDIATE MEDICAL CARE IF:   You have new, unexplained symptoms.  Your symptoms get worse and are not helped or controlled with medicines. MAKE SURE YOU:   Understand these instructions.  Will watch your condition.  Will get help right away if you are not doing well or get worse. Document Released: 09/12/2000 Document Revised: 01/30/2014 Document Reviewed: 08/01/2011 [employer] Patient Information 2015 [employer]. This information is not intended to replace advice given to you by your health care provider. Make sure you discuss any questions you have with your health care provider.  

## 2015-05-05 NOTE — ED Notes (Signed)
Patient with c/o right arm/hand numbness that started 2 days ago. No injury. 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 Patient reports home pain meds of oxycodone TID and currently on antibiotic treatment of abscess to right axillary area.

## 2015-05-07 NOTE — ED Provider Notes (Signed)
CSN: [id number]     Arrival date & time 05/05/15  0856 History   First MD Initiated Contact with Patient 05/05/15 [phone]     Chief Complaint  Patient presents with  . Numbness     (Consider location/radiation/quality/duration/timing/severity/associated sxs/prior Treatment) HPI   [name] is a 49 y.o. male who presents to the Emergency Department complaining of pain, numbness and tingling to the right wrist and fingers.  He states symptoms began 2 days ago.  He describes an intermittent numbness and tingling of the third, fourth and fifth fingers, difficulty holding objects and pain to the wrist that radiates to the forearm.  He reports frequent use of his cell phones and plays video games.  He denies chest pain, neck pain, sx's proximal to the forearm.  He also denies swelling or known injury.  Excessive use makes the sx's worse, improves with rest.     Past Medical History  Diagnosis Date  . High blood pressure   . Asthma   . Arthritis   . Acid reflux disease   . High cholesterol   . Gout    Past Surgical History  Procedure Laterality Date  . Joint replacement    . Total hip arthroplasty    . Spinal fusion     Family History  Problem Relation Age of Onset  . Diabetes Other    History  Substance Use Topics  . Smoking status: Current Every Day Smoker -- 0.50 packs/day for 20 years    Types: Cigarettes  . Smokeless tobacco: Never Used  . Alcohol Use: Yes     Comment: occasionally    Review of Systems  Constitutional: Negative for fever and chills.  Respiratory: Negative for chest tightness and shortness of breath.   Cardiovascular: Negative for chest pain.  Musculoskeletal: Positive for arthralgias. Negative for joint swelling.  Skin: Negative for color change and wound.  Neurological: Positive for numbness (numbness and tingling of the left fingers).  All other systems reviewed and are negative.     Allergies  Review of patient's allergies indicates no  known allergies.  Home Medications   Prior to Admission medications   Medication Sig Start Date End Date Taking? Authorizing Provider  albuterol (PROVENTIL HFA;VENTOLIN HFA) 108 (90 BASE) MCG/ACT inhaler Inhale 2 puffs into the lungs 4 (four) times daily. For shortness of breath or wheezing    Historical Provider, MD  atorvastatin (LIPITOR) 40 MG tablet Take 40 mg by mouth daily.    Historical Provider, MD  gabapentin (NEURONTIN) 300 MG capsule Take 300 mg by mouth daily.     Historical Provider, MD  hydrOXYzine (ATARAX/VISTARIL) 25 MG tablet Take 1 tablet (25 mg total) by mouth every 6 (six) hours as needed for itching. 03/15/15   Dorleen Kissel, PA-C  levothyroxine (SYNTHROID, LEVOTHROID) 50 MCG tablet Take 50 mcg by mouth daily before breakfast.    Historical Provider, MD  lisinopril (PRINIVIL,ZESTRIL) 20 MG tablet Take 20 mg by mouth daily.    Historical Provider, MD  omeprazole (PRILOSEC) 20 MG capsule Take 20 mg by mouth daily.    Historical Provider, MD  oxyCODONE (OXY IR/ROXICODONE) 5 MG immediate release tablet Take 5 mg by mouth every 6 (six) hours as needed for moderate pain.    Historical Provider, MD  Oxycodone HCl 10 MG TABS Take 1 tablet (10 mg total) by mouth every 6 (six) hours as needed. Patient not taking: Reported on 02/24/2015 01/18/14   Geoffery Lyons, MD  permethrin (ELIMITE) 5 %  cream Apply from neck down, leave on 14 hrs then wash off.  May re-apply in one week if needed 03/15/15   Elayah Klooster, PA-C  predniSONE (DELTASONE) 10 MG tablet Take 2 tablets (20 mg total) by mouth daily. 02/24/15   Bethann Berkshire, MD   BP 127/87 mmHg  Pulse 89  Temp(Src) 97.9 F (36.6 C) (Oral)  Resp 17  Ht  (1.702 m)  Wt 240 lb (108.863 kg)  BMI 37.58 kg/m2  SpO2 100% Physical Exam  Constitutional: He is oriented to person, place, and time. He appears well-developed and well-nourished. No distress.  HENT:  Head: Normocephalic and atraumatic.  Neck: Normal range of motion. Neck  supple.  Cardiovascular: Normal rate, regular rhythm, normal heart sounds and intact distal pulses.   No murmur heard. Pulmonary/Chest: Effort normal and breath sounds normal. No respiratory distress.  Musculoskeletal: Normal range of motion. He exhibits tenderness. He exhibits no edema.  Positive Tinel's sign of left wrist.  CR< 2 sec, no edema or erythema.  Radial pulse brisk.  Neurological: He is alert and oriented to person, place, and time. He exhibits normal muscle tone. Coordination normal.  Skin: Skin is warm. No erythema.  Nursing note and vitals reviewed.   ED Course  Procedures (including critical care time) Labs Review Labs Reviewed - No data to display  Imaging Review No results found.    EKG Interpretation None      MDM   Final diagnoses:  Carpal tunnel syndrome of right wrist    Pt is well appearing.  Vitals stable.  Sx's c/w carpal tunnel.  He agrees to symptomatic tx and orthopedic f/u   Wrist splint applied, pain improved and remains NV intact.    Pauline Aus, PA-C 05/07/15 2108  Vanetta Mulders, MD 05/10/15 [phone]

## 2015-07-08 ENCOUNTER — Encounter (HOSPITAL_COMMUNITY): Payer: Self-pay

## 2015-07-08 ENCOUNTER — Emergency Department (HOSPITAL_COMMUNITY)
Admission: EM | Admit: 2015-07-08 | Discharge: 2015-07-08 | Disposition: A | Discharge status: Home / Self Care | Payer: Medicaid Other | Attending: Emergency Medicine | Admitting: Emergency Medicine

## 2015-07-08 DIAGNOSIS — Z7952 Long term (current) use of systemic steroids: Secondary | ICD-10-CM | POA: Diagnosis not present

## 2015-07-08 DIAGNOSIS — Z720 Tobacco use: Secondary | ICD-10-CM | POA: Diagnosis not present

## 2015-07-08 DIAGNOSIS — E7800 Pure hypercholesterolemia, unspecified: Secondary | ICD-10-CM | POA: Insufficient documentation

## 2015-07-08 DIAGNOSIS — M109 Gout, unspecified: Secondary | ICD-10-CM | POA: Diagnosis not present

## 2015-07-08 DIAGNOSIS — K219 Gastro-esophageal reflux disease without esophagitis: Secondary | ICD-10-CM | POA: Diagnosis not present

## 2015-07-08 DIAGNOSIS — M1990 Unspecified osteoarthritis, unspecified site: Secondary | ICD-10-CM | POA: Diagnosis not present

## 2015-07-08 DIAGNOSIS — J45909 Unspecified asthma, uncomplicated: Secondary | ICD-10-CM | POA: Insufficient documentation

## 2015-07-08 DIAGNOSIS — L02831 Carbuncle of head [any part, except face]: Secondary | ICD-10-CM | POA: Diagnosis not present

## 2015-07-08 DIAGNOSIS — L02811 Cutaneous abscess of head [any part, except face]: Secondary | ICD-10-CM | POA: Diagnosis present

## 2015-07-08 DIAGNOSIS — L723 Sebaceous cyst: Secondary | ICD-10-CM | POA: Diagnosis not present

## 2015-07-08 DIAGNOSIS — Z79899 Other long term (current) drug therapy: Secondary | ICD-10-CM | POA: Diagnosis not present

## 2015-07-08 DIAGNOSIS — I10 Essential (primary) hypertension: Secondary | ICD-10-CM | POA: Diagnosis not present

## 2015-07-08 DIAGNOSIS — L0291 Cutaneous abscess, unspecified: Secondary | ICD-10-CM

## 2015-07-08 MED ORDER — HYDROCODONE-ACETAMINOPHEN 5-325 MG PO TABS: 1.0000 | ORAL_TABLET | ORAL | Status: DC | PRN

## 2015-07-08 MED ORDER — DOXYCYCLINE HYCLATE 100 MG PO TABS
100.0000 mg | ORAL_TABLET | Freq: Once | ORAL | Status: AC
  Administered 2015-07-08: 100 mg via ORAL
  Filled 2015-07-08: qty 1

## 2015-07-08 MED ORDER — DOXYCYCLINE HYCLATE 100 MG PO CAPS: 100.0000 mg | ORAL_CAPSULE | Freq: Two times a day (BID) | ORAL | Status: DC

## 2015-07-08 MED ORDER — LIDOCAINE-EPINEPHRINE (PF) 1 %-1:200000 IJ SOLN
INTRAMUSCULAR | Status: AC
  Administered 2015-07-08: 22:00:00
  Filled 2015-07-08: qty 10

## 2015-07-08 NOTE — Discharge Instructions (Signed)
° °  Remove gauze in 48 hours. Recheck with Your PCP if not improving.  Abscess An abscess is an infected area that contains a collection of pus and debris.It can occur in almost any part of the body. An abscess is also known as a furuncle or boil. CAUSES  An abscess occurs when tissue gets infected. This can occur from blockage of oil or sweat glands, infection of hair follicles, or a minor injury to the skin. As the body tries to fight the infection, pus collects in the area and creates pressure under the skin. This pressure causes pain. People with weakened immune systems have difficulty fighting infections and get certain abscesses more often.  SYMPTOMS Usually an abscess develops on the skin and becomes a painful mass that is red, warm, and tender. If the abscess forms under the skin, you may feel a moveable soft area under the skin. Some abscesses break open (rupture) on their own, but most will continue to get worse without care. The infection can spread deeper into the body and eventually into the bloodstream, causing you to feel ill.  DIAGNOSIS  Your caregiver will take your medical history and perform a physical exam. A sample of fluid may also be taken from the abscess to determine what is causing your infection. TREATMENT  Your caregiver may prescribe antibiotic medicines to fight the infection. However, taking antibiotics alone usually does not cure an abscess. Your caregiver may need to make a small cut (incision) in the abscess to drain the pus. In some cases, gauze is packed into the abscess to reduce pain and to continue draining the area. HOME CARE INSTRUCTIONS   Only take over-the-counter or prescription medicines for pain, discomfort, or fever as directed by your caregiver.  If you were prescribed antibiotics, take them as directed. Finish them even if you start to feel better.  If gauze is used, follow your caregiver's directions for changing the gauze.  To avoid spreading  the infection:  Keep your draining abscess covered with a bandage.  Wash your hands well.  Do not share personal care items, towels, or whirlpools with others.  Avoid skin contact with others.  Keep your skin and clothes clean around the abscess.  Keep all follow-up appointments as directed by your caregiver. SEEK MEDICAL CARE IF:   You have increased pain, swelling, redness, fluid drainage, or bleeding.  You have muscle aches, chills, or a general ill feeling.  You have a fever. MAKE SURE YOU:   Understand these instructions.  Will watch your condition.  Will get help right away if you are not doing well or get worse.   This information is not intended to replace advice given to you by your health care provider. Make sure you discuss any questions you have with your health care provider.   Document Released: 06/25/2005 Document Revised: 03/16/2012 Document Reviewed: 11/28/2011 Elsevier Interactive Patient Education [employer]

## 2015-07-08 NOTE — ED Provider Notes (Signed)
CSN: [id number]     Arrival date & time 07/08/15  2114 History  By signing my name below, I, [name], attest that this documentation has been prepared under the direction and in the presence of Ricky Porter, MD. Electronically Signed: Lyndel Vazquez, ED Scribe. 07/08/2015. 9:39 PM.   Chief Complaint  Patient presents with  . Cyst   The history is provided by the patient. No language interpreter was used.   HPI Comments: Ricky SURGEON is a 49 y.o. male, with a significant PMhx, who presents to the Emergency Department complaining of a gradually worsening area of pain and swelling posterior to left ear on left scalp that has been present for 3 weeks but acutely worsened with drainage this morning. He notes a PMhx of an abscess to his abdomen that he was admitted for and received IV antibiotics.   Past Medical History  Diagnosis Date  . High blood pressure   . Asthma   . Arthritis   . Acid reflux disease   . High cholesterol   . Gout    Past Surgical History  Procedure Laterality Date  . Joint replacement    . Total hip arthroplasty    . Spinal fusion     Family History  Problem Relation Age of Onset  . Diabetes Other    Social History  Substance Use Topics  . Smoking status: Current Every Day Smoker -- 0.50 packs/day for 20 years    Types: Cigarettes  . Smokeless tobacco: Never Used  . Alcohol Use: Yes     Comment: occasionally    Review of Systems  Constitutional: Negative for fever, chills, diaphoresis, appetite change and fatigue.  HENT: Negative for mouth sores, sore throat and trouble swallowing.   Eyes: Negative for visual disturbance.  Respiratory: Negative for cough, chest tightness, shortness of breath and wheezing.   Cardiovascular: Negative for chest pain.  Gastrointestinal: Negative for nausea, vomiting, abdominal pain, diarrhea and abdominal distention.  Endocrine: Negative for polydipsia, polyphagia and polyuria.  Genitourinary: Negative for  dysuria, frequency and hematuria.  Musculoskeletal: Negative for gait problem.  Skin: Positive for wound ( draining abcess to left scalp posterior to left ear ). Negative for color change, pallor and rash.  Neurological: Negative for dizziness, syncope, light-headedness and headaches.  Hematological: Does not bruise/bleed easily.  Psychiatric/Behavioral: Negative for behavioral problems and confusion.   Allergies  Review of patient's allergies indicates no known allergies.  Home Medications   Prior to Admission medications   Medication Sig Start Date End Date Taking? Authorizing Provider  albuterol (PROVENTIL HFA;VENTOLIN HFA) 108 (90 BASE) MCG/ACT inhaler Inhale 2 puffs into the lungs 4 (four) times daily. For shortness of breath or wheezing    Historical Provider, MD  atorvastatin (LIPITOR) 40 MG tablet Take 40 mg by mouth daily.    Historical Provider, MD  doxycycline (VIBRAMYCIN) 100 MG capsule Take 1 capsule (100 mg total) by mouth 2 (two) times daily. 07/08/15   Ricky Porter, MD  gabapentin (NEURONTIN) 300 MG capsule Take 300 mg by mouth daily.     Historical Provider, MD  HYDROcodone-acetaminophen (NORCO/VICODIN) 5-325 MG tablet Take 1 tablet by mouth every 4 (four) hours as needed. 07/08/15   Ricky Porter, MD  hydrOXYzine (ATARAX/VISTARIL) 25 MG tablet Take 1 tablet (25 mg total) by mouth every 6 (six) hours as needed for itching. 03/15/15   Tammy Triplett, PA-C  levothyroxine (SYNTHROID, LEVOTHROID) 50 MCG tablet Take 50 mcg by mouth daily before breakfast.  Historical Provider, MD  lisinopril (PRINIVIL,ZESTRIL) 20 MG tablet Take 20 mg by mouth daily.    Historical Provider, MD  omeprazole (PRILOSEC) 20 MG capsule Take 20 mg by mouth daily.    Historical Provider, MD  oxyCODONE (OXY IR/ROXICODONE) 5 MG immediate release tablet Take 5 mg by mouth every 6 (six) hours as needed for moderate pain.    Historical Provider, MD  Oxycodone HCl 10 MG TABS Take 1 tablet (10 mg total) by mouth every  6 (six) hours as needed. Patient not taking: Reported on 02/24/2015 01/18/14   Geoffery Lyons, MD  permethrin (ELIMITE) 5 % cream Apply from neck down, leave on 14 hrs then wash off.  May re-apply in one week if needed 03/15/15   Tammy Triplett, PA-C  predniSONE (DELTASONE) 10 MG tablet Take 2 tablets (20 mg total) by mouth daily. 02/24/15   Bethann Berkshire, MD   BP 147/90 mmHg  Pulse 119  Temp(Src) 98.6 F (37 C) (Oral)  Resp 22  Ht  (1.702 m)  Wt 248 lb (112.492 kg)  BMI 38.83 kg/m2  SpO2 99% Physical Exam  Constitutional: He is oriented to person, place, and time. He appears well-developed and well-nourished. No distress.  HENT:  Head: Normocephalic.  1cm x 2cm carbuncle directly superior to left ear on scalp   Eyes: Conjunctivae are normal. Pupils are equal, round, and reactive to light. No scleral icterus.  Neck: Normal range of motion. Neck supple. No thyromegaly present.  Cardiovascular: Normal rate and regular rhythm.  Exam reveals no gallop and no friction rub.   No murmur heard. Pulmonary/Chest: Effort normal and breath sounds normal. No respiratory distress. He has no wheezes. He has no rales.  Abdominal: Soft. Bowel sounds are normal. He exhibits no distension. There is no tenderness. There is no rebound.  Musculoskeletal: Normal range of motion.  Neurological: He is alert and oriented to person, place, and time.  Skin: Skin is warm and dry. No rash noted.  Psychiatric: He has a normal mood and affect. His behavior is normal.    ED Course  Procedures  DIAGNOSTIC STUDIES: Oxygen Saturation is 99% on RA, normal by my interpretation.    COORDINATION OF CARE: 9:30 PM Discussed treatment plan with pt at bedside and pt agreed to plan. Will perform I&D procedure.   INCISION AND DRAINAGE PROCEDURE NOTE: Patient identification was confirmed and verbal consent was obtained. This procedure was performed by Ricky Porter, MD at 9:33 PM. Site: superior to left ear on scalp   Sterile procedures observed Anesthetic used (type and amt): lidocaine 1% with epinephrine  Blade size: 11 Drainage: scant purulent drainage  Packing used Site anesthetized, incision made over site, wound drained and explored loculations, rinsed with copious amounts of normal saline, wound packed with sterile gauze, covered with dry, sterile dressing.  Pt tolerated procedure well without complications.  Instructions for care discussed verbally and pt provided with additional written instructions for homecare and f/u.   MDM   Final diagnoses:  Sebaceous cyst  Abscess    I personally performed the services described in this documentation, which was scribed in my presence. The recorded information has been reviewed and is accurate.    Ricky Porter, MD 07/08/15 2149

## 2015-07-08 NOTE — ED Notes (Signed)
Sore behind my left ear that was draining this morning. Making my heal feel kind of funny per pt.

## 2015-09-16 ENCOUNTER — Emergency Department (HOSPITAL_COMMUNITY)
Admission: EM | Admit: 2015-09-16 | Discharge: 2015-09-16 | Disposition: A | Discharge status: Home / Self Care | Payer: Medicaid Other | Attending: Emergency Medicine | Admitting: Emergency Medicine

## 2015-09-16 ENCOUNTER — Encounter (HOSPITAL_COMMUNITY): Payer: Self-pay | Admitting: Emergency Medicine

## 2015-09-16 DIAGNOSIS — L409 Psoriasis, unspecified: Secondary | ICD-10-CM

## 2015-09-16 DIAGNOSIS — L03115 Cellulitis of right lower limb: Secondary | ICD-10-CM | POA: Diagnosis not present

## 2015-09-16 DIAGNOSIS — F17210 Nicotine dependence, cigarettes, uncomplicated: Secondary | ICD-10-CM | POA: Insufficient documentation

## 2015-09-16 DIAGNOSIS — Z792 Long term (current) use of antibiotics: Secondary | ICD-10-CM | POA: Diagnosis not present

## 2015-09-16 DIAGNOSIS — R21 Rash and other nonspecific skin eruption: Secondary | ICD-10-CM

## 2015-09-16 DIAGNOSIS — J45909 Unspecified asthma, uncomplicated: Secondary | ICD-10-CM | POA: Insufficient documentation

## 2015-09-16 DIAGNOSIS — I10 Essential (primary) hypertension: Secondary | ICD-10-CM | POA: Insufficient documentation

## 2015-09-16 DIAGNOSIS — Z8739 Personal history of other diseases of the musculoskeletal system and connective tissue: Secondary | ICD-10-CM | POA: Insufficient documentation

## 2015-09-16 DIAGNOSIS — K219 Gastro-esophageal reflux disease without esophagitis: Secondary | ICD-10-CM | POA: Diagnosis not present

## 2015-09-16 DIAGNOSIS — Z79899 Other long term (current) drug therapy: Secondary | ICD-10-CM | POA: Insufficient documentation

## 2015-09-16 DIAGNOSIS — L089 Local infection of the skin and subcutaneous tissue, unspecified: Secondary | ICD-10-CM | POA: Diagnosis present

## 2015-09-16 DIAGNOSIS — E7800 Pure hypercholesterolemia, unspecified: Secondary | ICD-10-CM | POA: Insufficient documentation

## 2015-09-16 MED ORDER — PREDNISONE 10 MG PO TABS: ORAL_TABLET | ORAL | Status: DC

## 2015-09-16 MED ORDER — SULFAMETHOXAZOLE-TRIMETHOPRIM 800-160 MG PO TABS: 1.0000 | ORAL_TABLET | Freq: Two times a day (BID) | ORAL | Status: AC

## 2015-09-16 NOTE — Discharge Instructions (Signed)
Cellulitis Cellulitis is an infection of the skin and the tissue under the skin. The infected area is usually red and tender. This happens most often in the arms and lower legs. HOME CARE   Take your antibiotic medicine as told. Finish the medicine even if you start to feel better.  Keep the infected arm or leg raised (elevated).  Put a warm cloth on the area up to 4 times per day.  Only take medicines as told by your doctor.  Keep all doctor visits as told. GET HELP IF:  You see red streaks on the skin coming from the infected area.  Your red area gets bigger or turns a dark color.  Your bone or joint under the infected area is painful after the skin heals.  Your infection comes back in the same area or different area.  You have a puffy (swollen) bump in the infected area.  You have new symptoms.  You have a fever. GET HELP RIGHT AWAY IF:   You feel very sleepy.  You throw up (vomit) or have watery poop (diarrhea).  You feel sick and have muscle aches and pains.   This information is not intended to replace advice given to you by your health care provider. Make sure you discuss any questions you have with your health care provider.   Document Released: 03/03/2008 Document Revised: 06/06/2015 Document Reviewed: 12/01/2011 Elsevier Interactive Patient Education 2016 Elsevier Inc.  Psoriasis Psoriasis is a long-term (chronic) skin condition. It causes raised, red patches (plaques) on your skin that look silvery. The red patches may show up anywhere on your body. They can be any size or shape.  Psoriasis can come and go. It can range from mild to very bad. It cannot be passed from one person to another (not contagious). There is no cure for this condition, but it can be helped with treatment. HOME CARE Skin Care  Apply moisturizers to your skin as needed. Only use those that your doctor has said are okay.  Apply cool compresses to the affected areas.  Do not scratch  your skin.  Lifestyle  Do not use tobacco products. This includes cigarettes, chewing tobacco, and e-cigarettes. If you need help quitting, ask your doctor.  Drink little or no alcohol.   Try to lower your stress. Meditation or yoga may help.  Get sun as told by your doctor. Do not get sunburned.   Think about joining a psoriasis support group.  Medicines  Take or use over-the-counter and prescription medicines only as told by your doctor.   If you were prescribed an antibiotic, take or use it as told by your doctor. Do not stop taking the antibiotic even if your condition starts to get better. General Instructions  Keep a journal. Use this to help track what triggers an outbreak. Try to avoid any triggers.  See a counselor or social worker if you feel very sad, upset, or hopeless about your condition and these feelings affect your work or relationships.  Keep all follow-up visits as told by your doctor. This is important. GET HELP IF:   Your pain gets worse.   You have more redness or warmth in the affected areas.  You have new pain or stiffness in your joints.  Your pain or stiffness in your joints gets worse.  Your nails start to break easily.  Your nails pull away from the nail bed easily.  You have a fever.   You feel very sad (depressed).  This information is not intended to replace advice given to you by your health care provider. Make sure you discuss any questions you have with your health care provider.   Document Released: 10/23/2004 Document Revised: 06/06/2015 Document Reviewed: 01/31/2015 Elsevier Interactive Patient Education 2016 [employer].    Emergency Department Resource Guide 1) Find a Doctor and Pay Out of Pocket Although you won't have to find out who is covered by your insurance plan, it is a good idea to ask around and get recommendations. You will then need to call the office and see if the doctor you have chosen will accept you  as a new patient and what types of options they offer for patients who are self-pay. Some doctors offer discounts or will set up payment plans for their patients who do not have insurance, but you will need to ask so you aren't surprised when you get to your appointment.  2) Contact Your Local Health Department Not all health departments have doctors that can see patients for sick visits, but many do, so it is worth a call to see if yours does. If you don't know where your local health department is, you can check in your phone book. The CDC also has a tool to help you locate your state's health department, and many state websites also have listings of all of their local health departments.  3) Find a Walk-in Clinic If your illness is not likely to be very severe or complicated, you may want to try a walk in clinic. These are popping up all over the country in pharmacies, drugstores, and shopping centers. They're usually staffed by nurse practitioners or physician assistants that have been trained to treat common illnesses and complaints. They're usually fairly quick and inexpensive. However, if you have serious medical issues or chronic medical problems, these are probably not your best option.  No Primary Care Doctor: - Call Health Connect at  [phone] - they can help you locate a primary care doctor that  accepts your insurance, provides certain services, etc. - Physician Referral Service- [phone]  Chronic Pain Problems: Organization         Address  Phone   Notes  [facility]  [phone] Patients need to be referred by their primary care doctor.   Medication Assistance: Organization         Address  Phone   Notes  [facility] [address] [place] [place], [place] [address] [phone] --Must be a resident of [facility] -- Must have NO insurance coverage whatsoever (no Medicaid/ Medicare, etc.) -- The pt. MUST have  a primary care doctor that directs their care regularly and follows them in the community   [facility]  [phone]   [employer]  [phone]    Agencies that provide inexpensive medical care: Organization         Address  Phone   Notes  [facility]  [phone]   [facility]    [phone]   [facility] [address] [place], [place] [address] [phone]   [facility] [address] [place], [place] [phone]   Planned Parenthood    [phone]   [facility]    [phone]   [facility]  [address], [place] Phone:  [phone], Fax:  [phone] Hours of Operation:  9 am -  6 pm, M-F.  Also accepts Medicaid/Medicare and self-pay.  [facility]  [address], [place] Phone: [phone], Fax: [phone]. Hours of Operation:  8:30 am - 5:30 pm, M-F.  Also accepts Medicaid and self-pay.  [facility] [place] [address], [place] Phone: [phone]   [facility] [address] Natasha Bence [place], [place] [phone], Ext. [phone] Mondays & Thursdays: 7-9 AM.  First 15 patients are seen on a first come, first serve basis.    Medicaid-accepting [facility] Providers:  Organization         Address  Phone   Notes  [facility] [address], [place] [phone] Also accepts self-pay patients.  [facility] [address] Laurell Josephs [place], [place]  [phone]   [facility] [address], [place] [phone]   [facility] [address] [place] [phone]   Renaye Rakers [address], [place]   [phone] Only accepts [place] Access [place] patients after they have their name applied to their card.   Self-Pay (no insurance) in [facility]:  Organization         Address  Phone   Notes  Sickle Cell Patients, [facility] [address] [place], [place] [phone]   [facility] [address] [place], [place] [phone]   [facility] [place]  [address], [place] [phone]   [facility]/Dr. Osei-Bonsu  [address] [place] or [address], [place] [phone] Phone number for both [place] and [place] locations is the same.  [facility] [address] [place] [phone]   [facility] [address], [place] or [address] [phone]   [facility] [address], [place] [phone], phone; [phone], fax Sees patients 1st and 3rd Saturday of every month.  Must not qualify for public or private insurance (i.e. Medicaid, Medicare, [place] Health Choice, Veterans' Benefits)  Household income should be no more than 200% of the poverty level The clinic cannot treat you if you are pregnant or think you are pregnant  Sexually transmitted diseases are not treated at the clinic.    Dental Care: Organization         Address  Phone  Notes  [facility] [address] [place], [place] [phone] Accepts children up to age 48 who are enrolled in [place] or [place] Health Choice; pregnant women with a Medicaid card; and children who have applied for Medicaid or [place] Health Choice, but were declined, whose parents can pay a reduced fee at time of service.  [facility]  [address], [place] [phone] Accepts children up to age 7 who are enrolled in [place] or [place] Health Choice; pregnant women with a Medicaid card; and children who have applied for Medicaid or [place] Health Choice, but were declined, whose parents can  pay a reduced fee at time of service.  [facility]  [address] [place], [place] [phone] Patients are seen by appointment only. Walk-ins are not accepted. [facility] will see patients 63 years of age and  older. Monday - Tuesday (8am-5pm) Most Wednesdays (8:30-5pm) $30 per visit, cash only  [facility]  [address], [facility] [phone] Patients are seen by appointment only. Walk-ins are not accepted. [facility] will see patients 50 years of age and older. One Wednesday Evening (Monthly: Volunteer Based).  $30 per visit, cash only  [facility] [facility]  [phone] for adults; Children under age 384, call Graduate Pediatric Dentistry at [phone]. Children aged 14-14, please call [phone]) [phone] to request a pediatric application.  Dental services are provided in all areas of dental care including fillings, crowns and bridges, complete and partial dentures, implants, gum treatment, root canals, and extractions. Preventive care is also provided. Treatment is provided to both adults and children. Patients are selected via a lottery and there is often a waiting list.   [facility] [address], [place]  [phone] [email or web address]   [facility] [address], [place], [place] [phone], Ext. [phone] Second and Fourth Thursday of each month, opens at 6:30 AM; Clinic ends at 9 AM.  Patients are seen on a first-come first-served basis, and a limited number are seen during each clinic.   [facility]  [address], [place], [place] [phone]   Eligibility Requirements You must have lived in [place], [place], or [place] counties for at least the last three months.   You cannot be eligible for state or federal sponsored [place]healthcare insurance, including CIGNAVeterans Administration, [place]icaid, or [employer]icare.   You generally cannot be eligible for healthcare  insurance through your employer.    How to apply: Eligibility screenings are held every Tuesday and Wednesday afternoon from 1:00 pm until 4:00 pm. You do not need an appointment for the interview!  [facility] [address], [place], [place] [phone]   [facility]  [phone]   [facility]  [phone]   [facility]  [phone]    Behavioral Health Resources in the Community: Intensive Outpatient Programs Organization         Address  Phone  Notes  [facility] [address], [place], [place] [phone]   [facility] [address], [place], [place] [phone]   [facility] [address], [place], [place]  [phone]   [facility] [address],  [place], [place] [phone] or [phone]   Substance Abuse Resources Organization         Address  Phone  Notes  [facility]  [phone]   [facility]  [phone]   [facility]  [phone]   [facility]  [phone]   Residential & Outpatient Substance Abuse Program  [phone]   Psychological Services Organization         Address  Phone  Notes  [facility]  [phone]- [phone]   [facility]  [phone]- [phone]   [facility] [address], [place] [phone] or [phone]    Mobile Crisis Teams Organization         Address  Phone  Notes  [facility], Mobile Crisis Care Unit  [phone]   [facility]  [address] [place], [place] [phone]   Doristine LocksSharon DeEsch [address] [place] [phone]    Self-Help/Support Groups Organization         Address  Phone  Notes  [facility] [place] - variety of support groups  [phone]- I7437963 Call for more information  Narcotics Anonymous (NA),  [facility] [address], [employer] [place]  2 meetings at this location   Statistician         Address  Phone  Notes  [facility] [address],    [place]  [phone]   [facility]  [address] [place] [address], [place], [place] [phone]   [facility] [address] [place], [place] [phone] Admissions: 8am-3pm M-F  [facility] [address],    [place], [place] [phone]   [facility] [address] [place], [place], [place] [phone]   [facility] [address]  [place], [place] [phone]   [facility] - Intensive Outpatient [address] Laurell Josephs [address], [place], [place] [phone]   [facility] ([facility] [address] [place].,  [place], [place] [phone] or [phone]   [facility] ([facility]) [address], [place], [place] [phone] Accepts Medicaid  [facility] [address],  [place] [phone] Substance Abuse/Addiction Treatment   [facility] Organization         Address  Phone  Notes  [facility]  [phone]   Angie Fava, PhD [address] Ervin Knack [place], [place]   [phone] or [phone]   [facility] Behavioral   [address] [place], [place] [phone]   [facility] [address] [place], [place] [phone] Insurance/Medicaid/sponsorship through [facility] and Families [address]                                    [place], [place] [phone] Therapy/tele-psych/case  [facility] [address], [place] [phone]    Dr. Lolly Mustache  [phone]   [facility]  [employer] [facility] Dept. 1) [address], [place] 2) [address], [place] 3)  [address], [place] [phone]  [phone]    [facility] Child Abuse Hotline 843-616-9974 or [phone] (After Hours)

## 2015-09-16 NOTE — ED Notes (Signed)
Pt c/o generalized itching rash to abdomen x 4 days, non healing scab abd redness to rle x 3 days, and redness and swelling to left thumb since yesterday.

## 2015-09-16 NOTE — ED Provider Notes (Signed)
CSN: [id number]     Arrival date & time 09/16/15  [id number] History   First MD Initiated Contact with Patient 09/16/15 0754     Chief Complaint  Patient presents with  . Recurrent Skin Infections     (Consider location/radiation/quality/duration/timing/severity/associated sxs/prior Treatment) The history is provided by the patient.   Ricky RossettiCharlie R Vazquez is a 49 y.o. male presenting for evaluation of rash and several tender erythematous skin lesions, one on his right anterior lower leg the other on his left thumb along his lateral fingernail.  He denies injury or trauma.  He woke 3 mornings ago and noted a "raw" abrasion on his right lower anterior leg which bled when he scratched it, since this has scabbed over, but has increasing surrounding redness and is tender to palpation.  There is been no drainage from the wound.  Additionally, he noted soreness along his left thumb cuticle yesterday evening then when he woke this morning it was more tender.  He denies injury or trauma to his thumb.  Has had no recent "hangnails".  There is been no drainage from the site.  He also endorses a recurrence of his chronic intermittent abdominal rash which is occasionally treated and response to steroids, last round of steroids for several months ago.  He was encouraged to see a dermatologist for this condition but has been unable to find an accepting Medicaid.  He denies fevers or chills.  He does have a history of psoriasis and has an acute flare on his bilateral posterior elbows.  He is taking no medications for alleviation of either condition.  Past Medical History  Diagnosis Date  . High blood pressure   . Asthma   . Arthritis   . Acid reflux disease   . High cholesterol   . Gout    Past Surgical History  Procedure Laterality Date  . Joint replacement    . Total hip arthroplasty    . Spinal fusion     Family History  Problem Relation Age of Onset  . Diabetes Other    Social History  Substance Use  Topics  . Smoking status: Current Every Day Smoker -- 0.50 packs/day for 20 years    Types: Cigarettes  . Smokeless tobacco: Never Used  . Alcohol Use: Yes     Comment: occasionally    Review of Systems  Constitutional: Negative for fever and chills.  Respiratory: Negative for shortness of breath and wheezing.   Skin: Positive for color change and rash.  Neurological: Negative for numbness.      Allergies  Review of patient's allergies indicates no known allergies.  Home Medications   Prior to Admission medications   Medication Sig Start Date End Date Taking? Authorizing Provider  albuterol (PROVENTIL HFA;VENTOLIN HFA) 108 (90 BASE) MCG/ACT inhaler Inhale 2 puffs into the lungs 4 (four) times daily. For shortness of breath or wheezing    Historical Provider, MD  atorvastatin (LIPITOR) 40 MG tablet Take 40 mg by mouth daily.    Historical Provider, MD  doxycycline (VIBRAMYCIN) 100 MG capsule Take 1 capsule (100 mg total) by mouth 2 (two) times daily. 07/08/15   Rolland PorterMark James, MD  gabapentin (NEURONTIN) 300 MG capsule Take 300 mg by mouth daily.     Historical Provider, MD  HYDROcodone-acetaminophen (NORCO/VICODIN) 5-325 MG tablet Take 1 tablet by mouth every 4 (four) hours as needed. 07/08/15   Rolland PorterMark James, MD  hydrOXYzine (ATARAX/VISTARIL) 25 MG tablet Take 1 tablet (25 mg total) by mouth every  6 (six) hours as needed for itching. 03/15/15   Tammy Triplett, PA-C  levothyroxine (SYNTHROID, LEVOTHROID) 50 MCG tablet Take 50 mcg by mouth daily before breakfast.    Historical Provider, MD  lisinopril (PRINIVIL,ZESTRIL) 20 MG tablet Take 20 mg by mouth daily.    Historical Provider, MD  omeprazole (PRILOSEC) 20 MG capsule Take 20 mg by mouth daily.    Historical Provider, MD  oxyCODONE (OXY IR/ROXICODONE) 5 MG immediate release tablet Take 5 mg by mouth every 6 (six) hours as needed for moderate pain.    Historical Provider, MD  Oxycodone HCl 10 MG TABS Take 1 tablet (10 mg total) by mouth  every 6 (six) hours as needed. Patient not taking: Reported on 02/24/2015 01/18/14   Geoffery Lyons, MD  permethrin (ELIMITE) 5 % cream Apply from neck down, leave on 14 hrs then wash off.  May re-apply in one week if needed 03/15/15   Tammy Triplett, PA-C  predniSONE (DELTASONE) 10 MG tablet Take 6 tabs daily by mouth for 2 day,  Then 5 tabs daily for 2 days,  4 tabs daily for 2 days,  3 tabs daily for 2 days,  2 tabs daily for 2 days,  Then 1 tab daily for 2 days. 09/16/15   Burgess Amor, PA-C  sulfamethoxazole-trimethoprim (BACTRIM DS,SEPTRA DS) 800-160 MG tablet Take 1 tablet by mouth 2 (two) times daily. 09/16/15 09/23/15  Burgess Amor, PA-C   BP 148/105 mmHg  Pulse 114  Temp(Src) 97.7 F (36.5 C) (Oral)  Resp 18  Ht  (1.702 m)  Wt 108.863 kg  BMI 37.58 kg/m2  SpO2 97% Physical Exam  Constitutional: He appears well-developed and well-nourished.  HENT:  Head: Normocephalic and atraumatic.  Eyes: Conjunctivae are normal.  Neck: Normal range of motion.  Cardiovascular: Regular rhythm and normal heart sounds.   Pulmonary/Chest: Effort normal.  Musculoskeletal: Normal range of motion.  Neurological: He is alert.  Skin: Skin is warm and dry.  Thick psoriasis patches bilateral posterior elbow and forearm, white solid patch.  Small macular patches on left abdomen and chest wall, slightly erythematous, scaling borders.  There is no drainage, no pustules or papules.  Mild edema and erythema left lateral thumb border.  There is no palpable or visible paronychia or pus pocket.  There is a 0.5 cm scab mid-right lower leg with a 3 cm surrounding erythema which is tender.  There is no drainage, no edema, induration or fluctuance.  No red streaking.  Psychiatric: He has a normal mood and affect.  Nursing note and vitals reviewed.   ED Course  Procedures (including critical care time) Labs Review Labs Reviewed - No data to display  Imaging Review No results found. I have personally reviewed  and evaluated these images and lab results as part of my medical decision-making.   EKG Interpretation None      MDM   Final diagnoses:  Cellulitis of right lower extremity  Psoriasis  Rash   Pt with suspected early cellulitis on right lower anterior leg,  Edema left lateral thumb without paronychia.  Bactrim prescribed.  Advised warm water soaks, recheck if not improving over the next 48 hours.  He is also placed on a prednisone taper for his truncal rash and to assist with his psoriasis flare.  He was encouraged to follow-up with his PCP and/or dermatology.  Referrals were given for this. Review of prior ed visits with patient frequently presenting with mild tachycardia.  He denies chest pain or palpitations.  Burgess Amor, PA-C 09/16/15 6045  Glynn Octave, MD 09/16/15 [phone]

## 2015-11-25 ENCOUNTER — Emergency Department (HOSPITAL_COMMUNITY)
Admission: EM | Admit: 2015-11-25 | Discharge: 2015-11-25 | Disposition: A | Discharge status: Home / Self Care | Payer: Medicaid Other | Attending: Emergency Medicine | Admitting: Emergency Medicine

## 2015-11-25 ENCOUNTER — Encounter (HOSPITAL_COMMUNITY): Payer: Self-pay | Admitting: Emergency Medicine

## 2015-11-25 ENCOUNTER — Emergency Department (HOSPITAL_COMMUNITY): Payer: Medicaid Other

## 2015-11-25 DIAGNOSIS — L539 Erythematous condition, unspecified: Secondary | ICD-10-CM | POA: Diagnosis not present

## 2015-11-25 DIAGNOSIS — F17210 Nicotine dependence, cigarettes, uncomplicated: Secondary | ICD-10-CM | POA: Insufficient documentation

## 2015-11-25 DIAGNOSIS — K219 Gastro-esophageal reflux disease without esophagitis: Secondary | ICD-10-CM | POA: Insufficient documentation

## 2015-11-25 DIAGNOSIS — M7989 Other specified soft tissue disorders: Secondary | ICD-10-CM | POA: Insufficient documentation

## 2015-11-25 DIAGNOSIS — R600 Localized edema: Secondary | ICD-10-CM | POA: Diagnosis not present

## 2015-11-25 DIAGNOSIS — R3915 Urgency of urination: Secondary | ICD-10-CM | POA: Diagnosis not present

## 2015-11-25 DIAGNOSIS — L299 Pruritus, unspecified: Secondary | ICD-10-CM | POA: Insufficient documentation

## 2015-11-25 DIAGNOSIS — J45909 Unspecified asthma, uncomplicated: Secondary | ICD-10-CM | POA: Diagnosis not present

## 2015-11-25 DIAGNOSIS — E7800 Pure hypercholesterolemia, unspecified: Secondary | ICD-10-CM | POA: Diagnosis not present

## 2015-11-25 DIAGNOSIS — Z79899 Other long term (current) drug therapy: Secondary | ICD-10-CM | POA: Diagnosis not present

## 2015-11-25 DIAGNOSIS — R339 Retention of urine, unspecified: Secondary | ICD-10-CM | POA: Diagnosis present

## 2015-11-25 LAB — URINALYSIS, ROUTINE W REFLEX MICROSCOPIC
Bilirubin Urine: NEGATIVE
GLUCOSE, UA: NEGATIVE mg/dL
Hgb urine dipstick: NEGATIVE
Ketones, ur: NEGATIVE mg/dL
LEUKOCYTES UA: NEGATIVE
Nitrite: NEGATIVE
PH: 7 (ref 5.0–8.0)
Protein, ur: NEGATIVE mg/dL
Specific Gravity, Urine: <1.005 — ABNORMAL LOW (ref 1.005–1.030)

## 2015-11-25 LAB — CBC WITH DIFFERENTIAL/PLATELET
Basophils Absolute: 0.1 10*3/uL (ref 0.0–0.1)
Basophils Relative: 1 %
Eosinophils Absolute: 0.3 10*3/uL (ref 0.0–0.7)
Eosinophils Relative: 2 %
HCT: 44.7 % (ref 39.0–52.0)
HEMOGLOBIN: 15.4 g/dL (ref 13.0–17.0)
LYMPHS ABS: 1.1 10*3/uL (ref 0.7–4.0)
LYMPHS PCT: 8 %
MCH: 31.2 pg (ref 26.0–34.0)
MCHC: 34.5 g/dL (ref 30.0–36.0)
MCV: 90.5 fL (ref 78.0–100.0)
Monocytes Absolute: 1.4 10*3/uL — ABNORMAL HIGH (ref 0.1–1.0)
Monocytes Relative: 11 %
NEUTROS PCT: 78 %
Neutro Abs: 10.2 10*3/uL — ABNORMAL HIGH (ref 1.7–7.7)
Platelets: 298 10*3/uL (ref 150–400)
RBC: 4.94 MIL/uL (ref 4.22–5.81)
RDW: 12.9 % (ref 11.5–15.5)
WBC: 13 10*3/uL — AB (ref 4.0–10.5)

## 2015-11-25 LAB — COMPREHENSIVE METABOLIC PANEL
ALT: 18 U/L (ref 17–63)
AST: 22 U/L (ref 15–41)
Albumin: 3.9 g/dL (ref 3.5–5.0)
Alkaline Phosphatase: 172 U/L — ABNORMAL HIGH (ref 38–126)
Anion gap: 8 (ref 5–15)
BUN: 6 mg/dL (ref 6–20)
CALCIUM: 8.7 mg/dL — AB (ref 8.9–10.3)
CHLORIDE: 98 mmol/L — AB (ref 101–111)
CO2: 27 mmol/L (ref 22–32)
CREATININE: 0.8 mg/dL (ref 0.61–1.24)
GFR calc Af Amer: >60 mL/min (ref >60)
Glucose, Bld: 117 mg/dL — ABNORMAL HIGH (ref 65–99)
Potassium: 4 mmol/L (ref 3.5–5.1)
Sodium: 133 mmol/L — ABNORMAL LOW (ref 135–145)
Total Bilirubin: 0.7 mg/dL (ref 0.3–1.2)
Total Protein: 7.2 g/dL (ref 6.5–8.1)

## 2015-11-25 LAB — LIPASE, BLOOD: LIPASE: 24 U/L (ref 11–51)

## 2015-11-25 LAB — TROPONIN I: Troponin I: <0.03 ng/mL (ref <0.031)

## 2015-11-25 MED ORDER — TAMSULOSIN HCL 0.4 MG PO CAPS: 0.4000 mg | ORAL_CAPSULE | Freq: Every day | ORAL | Status: DC

## 2015-11-25 MED ORDER — SODIUM CHLORIDE 0.9 % IV BOLUS (SEPSIS)
1000.0000 mL | Freq: Once | INTRAVENOUS | Status: AC
  Administered 2015-11-25: 1000 mL via INTRAVENOUS

## 2015-11-25 NOTE — ED Provider Notes (Signed)
CSN: [id number]     Arrival date & time 11/25/15  4098 History   First MD Initiated Contact with Patient 11/25/15 [id number]     Chief Complaint  Patient presents with  . Urinary Retention     (Consider location/radiation/quality/duration/timing/severity/associated sxs/prior Treatment) HPI.... Patient reports difficulty urinating for the past 2 days. It takes anywhere from 10-25 minutes to get his urine stream started. He also complains of some swelling and itching in his lower extremities. He has had a infection on the proximal phalanx of his right middle finger for which he has been on antibiotics. No fever, sweats, chills, chest pain, dyspnea  Past Medical History  Diagnosis Date  . High blood pressure   . Asthma   . Arthritis   . Acid reflux disease   . High cholesterol   . Gout    Past Surgical History  Procedure Laterality Date  . Joint replacement    . Total hip arthroplasty    . Spinal fusion     Family History  Problem Relation Age of Onset  . Diabetes Other    Social History  Substance Use Topics  . Smoking status: Current Every Day Smoker -- 0.50 packs/day for 20 years    Types: Cigarettes  . Smokeless tobacco: Never Used  . Alcohol Use: Yes     Comment: occasionally    Review of Systems  All other systems reviewed and are negative.     Allergies  Review of patient's allergies indicates no known allergies.  Home Medications   Prior to Admission medications   Medication Sig Start Date End Date Taking? Authorizing Provider  albuterol (PROVENTIL HFA;VENTOLIN HFA) 108 (90 BASE) MCG/ACT inhaler Inhale 2 puffs into the lungs 4 (four) times daily. For shortness of breath or wheezing   Yes Historical Provider, MD  atorvastatin (LIPITOR) 40 MG tablet Take 40 mg by mouth daily.   Yes Historical Provider, MD  cephALEXin (KEFLEX) 500 MG capsule Take 500 mg by mouth 4 (four) times daily. Starting 11/21/2014 x 10 days.   Yes Historical Provider, MD  gabapentin  (NEURONTIN) 300 MG capsule Take 300 mg by mouth daily.    Yes Historical Provider, MD  hydrOXYzine (ATARAX/VISTARIL) 25 MG tablet Take 1 tablet (25 mg total) by mouth every 6 (six) hours as needed for itching. 03/15/15  Yes Tammy Triplett, PA-C  levothyroxine (SYNTHROID, LEVOTHROID) 50 MCG tablet Take 50 mcg by mouth daily before breakfast.   Yes Historical Provider, MD  lisinopril (PRINIVIL,ZESTRIL) 20 MG tablet Take 20 mg by mouth daily.   Yes Historical Provider, MD  omeprazole (PRILOSEC) 20 MG capsule Take 20 mg by mouth daily.   Yes Historical Provider, MD  oxyCODONE (OXY IR/ROXICODONE) 5 MG immediate release tablet Take 5 mg by mouth every 6 (six) hours as needed for moderate pain.   Yes Historical Provider, MD  doxycycline (VIBRAMYCIN) 100 MG capsule Take 1 capsule (100 mg total) by mouth 2 (two) times daily. Patient not taking: Reported on 11/25/2015 07/08/15   Rolland Porter, MD  HYDROcodone-acetaminophen (NORCO/VICODIN) 5-325 MG tablet Take 1 tablet by mouth every 4 (four) hours as needed. Patient not taking: Reported on 11/25/2015 07/08/15   Rolland Porter, MD  permethrin (ELIMITE) 5 % cream Apply from neck down, leave on 14 hrs then wash off.  May re-apply in one week if needed Patient not taking: Reported on 11/25/2015 03/15/15   Tammy Triplett, PA-C  predniSONE (DELTASONE) 10 MG tablet Take 6 tabs daily by mouth for 2 day,  Then 5 tabs daily for 2 days,  4 tabs daily for 2 days,  3 tabs daily for 2 days,  2 tabs daily for 2 days,  Then 1 tab daily for 2 days. Patient not taking: Reported on 11/25/2015 09/16/15   Burgess Amor, PA-C  tamsulosin (FLOMAX) 0.4 MG CAPS capsule Take 1 capsule (0.4 mg total) by mouth daily. 11/25/15   Donnetta Hutching, MD   BP 133/76 mmHg  Pulse 110  Temp(Src) 98.2 F (36.8 C) (Oral)  Resp 13  Ht  (1.702 m)  Wt 240 lb (108.863 kg)  BMI 37.58 kg/m2  SpO2 97% Physical Exam  Constitutional: He is oriented to person, place, and time. He appears well-developed and  well-nourished.  HENT:  Head: Normocephalic and atraumatic.  Eyes: Conjunctivae and EOM are normal. Pupils are equal, round, and reactive to light.  Neck: Normal range of motion. Neck supple.  Cardiovascular: Normal rate and regular rhythm.   Pulmonary/Chest: Effort normal and breath sounds normal.  Abdominal: Soft. Bowel sounds are normal.  Musculoskeletal: Normal range of motion.  Neurological: He is alert and oriented to person, place, and time.  Skin: Skin is warm and dry.  1+ peripheral edema.    Right middle finger: Erythema approximately 1.5 cm in diameter on dorsal aspect proximal phalanx  Psychiatric: He has a normal mood and affect. His behavior is normal.  Nursing note and vitals reviewed.   ED Course  Procedures (including critical care time) Labs Review Labs Reviewed  CBC WITH DIFFERENTIAL/PLATELET - Abnormal; Notable for the following:    WBC 13.0 (*)    Neutro Abs 10.2 (*)    Monocytes Absolute 1.4 (*)    All other components within normal limits  COMPREHENSIVE METABOLIC PANEL - Abnormal; Notable for the following:    Sodium 133 (*)    Chloride 98 (*)    Glucose, Bld 117 (*)    Calcium 8.7 (*)    Alkaline Phosphatase 172 (*)    All other components within normal limits  URINALYSIS, ROUTINE W REFLEX MICROSCOPIC (NOT AT [facility]) - Abnormal; Notable for the following:    Specific Gravity, Urine <1.005 (*)    All other components within normal limits  LIPASE, BLOOD  TROPONIN I    Imaging Review Dg Chest 2 View  11/25/2015  CLINICAL DATA:  50 year old male with acute shortness of breath, weakness, cough and urinary retention. EXAM: CHEST  2 VIEW COMPARISON:  06/22/2013 chest CT and prior exams. FINDINGS: The cardiomediastinal silhouette is unremarkable. Mild peribronchial thickening is unchanged. There is no evidence of focal airspace disease, pulmonary edema, suspicious pulmonary nodule/mass, pleural effusion, or pneumothorax. No acute bony abnormalities are  identified. IMPRESSION: No evidence of acute cardiopulmonary disease. Mild chronic peribronchial thickening. Electronically Signed   By: Harmon Pier M.D.   On: 11/25/2015 07:44   I have personally reviewed and evaluated these images and lab results as part of my medical decision-making.   EKG Interpretation   Date/Time:  Sunday November 25 2015 07:52:16 EST Ventricular Rate:  112 PR Interval:  141 QRS Duration: 87 QT Interval:  302 QTC Calculation: 412 R Axis:   23 Text Interpretation:  Sinus tachycardia Low voltage, precordial leads  Borderline T abnormalities, inferior leads Confirmed by Olyn Landstrom  MD, Hayven Fatima  ([id number]) on 11/25/2015 9:14:51 AM      MDM   Final diagnoses:  Urinary urgency    Patient is in no acute distress. Labs are reassuring. Slight white count noted. Alkaline phosphatase elevated  at 172. Urinalysis negative. Patient will continue antibiotic for his finger and I will prescribe Flomax 0.4 mg for his urinary symptoms. Suspect BPH. He has primary care follow-up.    Donnetta Hutching, MD 11/25/15 1104

## 2015-11-25 NOTE — ED Notes (Signed)
Patient has multiple complaints. Per patient urinary retention x2 days with swelling in hands, legs, and feet. Patient does report more swelling in left leg with "slight" shortness of breath. Denies any hx of CHF. Patient also c/o cough, headache, hiccups, itching, and redness in legs bilaterally.

## 2015-11-25 NOTE — Discharge Instructions (Signed)
Prescription for medication to increase urinary flow. Follow-up your primary care doctor this week. Otherwise, tests showed no life-threatening condition.

## 2016-02-16 ENCOUNTER — Encounter (HOSPITAL_COMMUNITY): Payer: Self-pay | Admitting: Emergency Medicine

## 2016-02-16 ENCOUNTER — Emergency Department (HOSPITAL_COMMUNITY)
Admission: EM | Admit: 2016-02-16 | Discharge: 2016-02-16 | Disposition: A | Discharge status: Home / Self Care | Payer: Medicaid Other | Attending: Emergency Medicine | Admitting: Emergency Medicine

## 2016-02-16 DIAGNOSIS — L03115 Cellulitis of right lower limb: Secondary | ICD-10-CM | POA: Diagnosis not present

## 2016-02-16 DIAGNOSIS — J45909 Unspecified asthma, uncomplicated: Secondary | ICD-10-CM | POA: Insufficient documentation

## 2016-02-16 DIAGNOSIS — Z79891 Long term (current) use of opiate analgesic: Secondary | ICD-10-CM | POA: Insufficient documentation

## 2016-02-16 DIAGNOSIS — G43909 Migraine, unspecified, not intractable, without status migrainosus: Secondary | ICD-10-CM | POA: Insufficient documentation

## 2016-02-16 DIAGNOSIS — F17210 Nicotine dependence, cigarettes, uncomplicated: Secondary | ICD-10-CM | POA: Diagnosis not present

## 2016-02-16 DIAGNOSIS — M1990 Unspecified osteoarthritis, unspecified site: Secondary | ICD-10-CM | POA: Diagnosis not present

## 2016-02-16 DIAGNOSIS — Z79899 Other long term (current) drug therapy: Secondary | ICD-10-CM | POA: Insufficient documentation

## 2016-02-16 DIAGNOSIS — L02415 Cutaneous abscess of right lower limb: Secondary | ICD-10-CM | POA: Diagnosis present

## 2016-02-16 LAB — CBC WITH DIFFERENTIAL/PLATELET
Basophils Absolute: 0.1 10*3/uL (ref 0.0–0.1)
Basophils Relative: 1 %
Eosinophils Absolute: 0.7 10*3/uL (ref 0.0–0.7)
Eosinophils Relative: 4 %
HCT: 42.2 % (ref 39.0–52.0)
HEMOGLOBIN: 14.5 g/dL (ref 13.0–17.0)
Lymphocytes Relative: 10 %
Lymphs Abs: 1.6 10*3/uL (ref 0.7–4.0)
MCH: 30.5 pg (ref 26.0–34.0)
MCHC: 34.4 g/dL (ref 30.0–36.0)
MCV: 88.7 fL (ref 78.0–100.0)
Monocytes Absolute: 1.2 10*3/uL — ABNORMAL HIGH (ref 0.1–1.0)
Monocytes Relative: 8 %
NEUTROS PCT: 77 %
Neutro Abs: 12.1 10*3/uL — ABNORMAL HIGH (ref 1.7–7.7)
Platelets: 338 10*3/uL (ref 150–400)
RBC: 4.76 MIL/uL (ref 4.22–5.81)
RDW: 13.6 % (ref 11.5–15.5)
WBC: 15.7 10*3/uL — ABNORMAL HIGH (ref 4.0–10.5)

## 2016-02-16 LAB — BASIC METABOLIC PANEL
Anion gap: 10 (ref 5–15)
BUN: 6 mg/dL (ref 6–20)
CO2: 28 mmol/L (ref 22–32)
CREATININE: 0.91 mg/dL (ref 0.61–1.24)
Calcium: 8.6 mg/dL — ABNORMAL LOW (ref 8.9–10.3)
Chloride: 94 mmol/L — ABNORMAL LOW (ref 101–111)
GFR calc Af Amer: >60 mL/min (ref >60)
GFR calc non Af Amer: >60 mL/min (ref >60)
Glucose, Bld: 129 mg/dL — ABNORMAL HIGH (ref 65–99)
Potassium: 4.7 mmol/L (ref 3.5–5.1)
Sodium: 132 mmol/L — ABNORMAL LOW (ref 135–145)

## 2016-02-16 MED ORDER — SODIUM CHLORIDE 0.9 % IV BOLUS (SEPSIS)
1000.0000 mL | Freq: Once | INTRAVENOUS | Status: AC
  Administered 2016-02-16: 1000 mL via INTRAVENOUS

## 2016-02-16 MED ORDER — KETOROLAC TROMETHAMINE 30 MG/ML IJ SOLN
15.0000 mg | Freq: Once | INTRAMUSCULAR | Status: AC
  Administered 2016-02-16: 15 mg via INTRAVENOUS
  Filled 2016-02-16: qty 1

## 2016-02-16 MED ORDER — DEXTROSE 5 % IV SOLN
1.0000 g | Freq: Once | INTRAVENOUS | Status: AC
  Administered 2016-02-16: 1 g via INTRAVENOUS
  Filled 2016-02-16: qty 10

## 2016-02-16 MED ORDER — CLINDAMYCIN PHOSPHATE 600 MG/50ML IV SOLN
600.0000 mg | Freq: Once | INTRAVENOUS | Status: AC
  Administered 2016-02-16: 600 mg via INTRAVENOUS
  Filled 2016-02-16: qty 50

## 2016-02-16 MED ORDER — DIPHENHYDRAMINE HCL 50 MG/ML IJ SOLN
12.5000 mg | Freq: Once | INTRAMUSCULAR | Status: AC
  Administered 2016-02-16: 12.5 mg via INTRAVENOUS
  Filled 2016-02-16: qty 1

## 2016-02-16 MED ORDER — PROCHLORPERAZINE EDISYLATE 5 MG/ML IJ SOLN
10.0000 mg | Freq: Once | INTRAMUSCULAR | Status: AC
  Administered 2016-02-16: 10 mg via INTRAVENOUS
  Filled 2016-02-16: qty 2

## 2016-02-16 NOTE — ED Notes (Signed)
Pt verbalized discharge instructions and follow up care. Pt stated he would return tomorrow after 12, noon for wound re-check. Pt ambulated with steady and even gait to lobby.

## 2016-02-16 NOTE — ED Provider Notes (Signed)
CSN: [id number]     Arrival date & time 02/16/16  1702 History   First MD Initiated Contact with Patient 02/16/16 1720     Chief Complaint  Patient presents with  . Migraine      HPI Patient has multiple complaints. Per patient migraine headache x1 week. Per patient taking tylenol and migraine headache medication with no relief. Patient reports sensitivity to light and sound. Denies any nausea and vomiting. Per patient when he eats or drinks feels like he cant swallow well. Patient states "feels like a brick in my throat." Per patient unable to sleep x2 days. Patient reports generalized body aches and generalized fatigue. Patient also c/o abscess to right posterior thigh with some drainage and red streaking. Patient also c/o left ankle pain with swelling. Denies any known injury Past Medical History  Diagnosis Date  . High blood pressure   . Asthma   . Arthritis   . Acid reflux disease   . High cholesterol   . Gout    Past Surgical History  Procedure Laterality Date  . Joint replacement    . Total hip arthroplasty    . Spinal fusion     Family History  Problem Relation Age of Onset  . Diabetes Other    Social History  Substance Use Topics  . Smoking status: Current Every Day Smoker -- 0.50 packs/day for 20 years    Types: Cigarettes  . Smokeless tobacco: Never Used  . Alcohol Use: Yes     Comment: occasionally    Review of Systems  Constitutional: Negative for fever and chills.  All other systems reviewed and are negative.     Allergies  Review of patient's allergies indicates no known allergies.  Home Medications   Prior to Admission medications   Medication Sig Start Date End Date Taking? Authorizing Provider  albuterol (PROVENTIL HFA;VENTOLIN HFA) 108 (90 BASE) MCG/ACT inhaler Inhale 2 puffs into the lungs 4 (four) times daily. For shortness of breath or wheezing   Yes Historical Provider, MD  atorvastatin (LIPITOR) 40 MG tablet Take 40 mg by mouth daily.    Yes Historical Provider, MD  levothyroxine (SYNTHROID, LEVOTHROID) 50 MCG tablet Take 50 mcg by mouth daily before breakfast.   Yes Historical Provider, MD  lisinopril (PRINIVIL,ZESTRIL) 20 MG tablet Take 20 mg by mouth daily.   Yes Historical Provider, MD  naproxen (NAPROSYN) 500 MG tablet Take 500 mg by mouth 2 (two) times daily with a meal.   Yes Historical Provider, MD  omeprazole (PRILOSEC) 20 MG capsule Take 20 mg by mouth daily.   Yes Historical Provider, MD  oxyCODONE (OXY IR/ROXICODONE) 5 MG immediate release tablet Take 5 mg by mouth every 6 (six) hours as needed for moderate pain.   Yes Historical Provider, MD  tamsulosin (FLOMAX) 0.4 MG CAPS capsule Take 1 capsule (0.4 mg total) by mouth daily. 11/25/15  Yes Donnetta Hutching, MD  doxycycline (VIBRAMYCIN) 100 MG capsule Take 1 capsule (100 mg total) by mouth 2 (two) times daily. Patient not taking: Reported on 11/25/2015 07/08/15   Rolland Porter, MD  HYDROcodone-acetaminophen (NORCO/VICODIN) 5-325 MG tablet Take 1 tablet by mouth every 4 (four) hours as needed. Patient not taking: Reported on 11/25/2015 07/08/15   Rolland Porter, MD  hydrOXYzine (ATARAX/VISTARIL) 25 MG tablet Take 1 tablet (25 mg total) by mouth every 6 (six) hours as needed for itching. 03/15/15   Tammy Triplett, PA-C  permethrin (ELIMITE) 5 % cream Apply from neck down, leave on 14 hrs  then wash off.  May re-apply in one week if needed Patient not taking: Reported on 11/25/2015 03/15/15   Tammy Triplett, PA-C  predniSONE (DELTASONE) 10 MG tablet Take 6 tabs daily by mouth for 2 day,  Then 5 tabs daily for 2 days,  4 tabs daily for 2 days,  3 tabs daily for 2 days,  2 tabs daily for 2 days,  Then 1 tab daily for 2 days. Patient not taking: Reported on 11/25/2015 09/16/15   Burgess AmorJulie Idol, PA-C   BP 137/89 mmHg  Pulse 115  Temp(Src) 98.8 F (37.1 C) (Oral)  Resp 18  Ht 5\' 7"  (1.702 m)  Wt 248 lb (112.492 kg)  BMI 38.83 kg/m2  SpO2 94% Physical Exam  Constitutional: He is oriented to  person, place, and time. He appears well-developed and well-nourished. No distress.  HENT:  Head: Normocephalic and atraumatic.  Eyes: Pupils are equal, round, and reactive to light.  Neck: Normal range of motion.  Cardiovascular: Normal rate and intact distal pulses.   Pulmonary/Chest: No respiratory distress.  Abdominal: Normal appearance. He exhibits no distension.  Musculoskeletal:       Legs: Neurological: He is alert and oriented to person, place, and time. No cranial nerve deficit.  Skin: Skin is warm and dry. No rash noted.  Psychiatric: He has a normal mood and affect. His behavior is normal.  Nursing note and vitals reviewed.   ED Course  Procedures (including critical care time) Medications  clindamycin (CLEOCIN) IVPB 600 mg (600 mg Intravenous New Bag/Given 02/16/16 1911)  sodium chloride 0.9 % bolus 1,000 mL (0 mLs Intravenous Stopped 02/16/16 1912)  prochlorperazine (COMPAZINE) injection 10 mg (10 mg Intravenous Given 02/16/16 1805)  diphenhydrAMINE (BENADRYL) injection 12.5 mg (12.5 mg Intravenous Given 02/16/16 1804)  ketorolac (TORADOL) 30 MG/ML injection 15 mg (15 mg Intravenous Given 02/16/16 1807)  cefTRIAXone (ROCEPHIN) 1 g in dextrose 5 % 50 mL IVPB (0 g Intravenous Stopped 02/16/16 1912)    Labs Review Labs Reviewed  CBC WITH DIFFERENTIAL/PLATELET - Abnormal; Notable for the following:    WBC 15.7 (*)    Neutro Abs 12.1 (*)    Monocytes Absolute 1.2 (*)    All other components within normal limits  BASIC METABOLIC PANEL - Abnormal; Notable for the following:    Sodium 132 (*)    Chloride 94 (*)    Glucose, Bld 129 (*)    Calcium 8.6 (*)    All other components within normal limits    Imaging Review No results found. I have personally reviewed and evaluated these images and lab results as part of my medical decision-making.   EKG Interpretation None      MDM   Final diagnoses:  Cellulitis of right lower extremity        Nelva Nayobert Markelle Asaro,  MD 02/16/16 1921

## 2016-02-16 NOTE — Discharge Instructions (Signed)

## 2016-02-16 NOTE — ED Notes (Signed)
Pt ambulated to restroom with steady and even gait at this time.   

## 2016-02-16 NOTE — ED Notes (Signed)
Patient has multiple complaints. Per patient migraine headache x1 week. Per patient taking tylenol and migraine headache medication with no relief. Patient reports sensitivity to light and sound. Denies any nausea and vomiting. Per patient when he eats or drinks feels like he cant swallow well. Patient states "feels like a brick in my throat." Per patient unable to sleep x2 days. Patient reports generalized body aches and generalized fatigue. Patient also c/o abscess to right posterior thigh with some drainage and red streaking. Patient also c/o left ankle pain with swelling. Denies any known injury.

## 2016-02-17 ENCOUNTER — Emergency Department (HOSPITAL_COMMUNITY)
Admission: EM | Admit: 2016-02-17 | Discharge: 2016-02-17 | Disposition: A | Discharge status: Home / Self Care | Payer: Medicaid Other | Attending: Emergency Medicine | Admitting: Emergency Medicine

## 2016-02-17 ENCOUNTER — Encounter (HOSPITAL_COMMUNITY): Payer: Self-pay

## 2016-02-17 DIAGNOSIS — M1990 Unspecified osteoarthritis, unspecified site: Secondary | ICD-10-CM | POA: Diagnosis not present

## 2016-02-17 DIAGNOSIS — J45909 Unspecified asthma, uncomplicated: Secondary | ICD-10-CM | POA: Insufficient documentation

## 2016-02-17 DIAGNOSIS — L03115 Cellulitis of right lower limb: Secondary | ICD-10-CM | POA: Diagnosis not present

## 2016-02-17 DIAGNOSIS — F17210 Nicotine dependence, cigarettes, uncomplicated: Secondary | ICD-10-CM | POA: Insufficient documentation

## 2016-02-17 DIAGNOSIS — L0390 Cellulitis, unspecified: Secondary | ICD-10-CM

## 2016-02-17 MED ORDER — CLINDAMYCIN HCL 150 MG PO CAPS
300.0000 mg | ORAL_CAPSULE | Freq: Once | ORAL | Status: AC
  Administered 2016-02-17: 300 mg via ORAL
  Filled 2016-02-17: qty 2

## 2016-02-17 MED ORDER — CLINDAMYCIN HCL 300 MG PO CAPS: 300.0000 mg | ORAL_CAPSULE | Freq: Four times a day (QID) | ORAL | Status: DC

## 2016-02-17 MED ORDER — CLINDAMYCIN HCL 150 MG PO CAPS: 300.0000 mg | ORAL_CAPSULE | Freq: Once | ORAL | Status: DC

## 2016-02-17 NOTE — ED Provider Notes (Signed)
CSN: [id number]     Arrival date & time 02/17/16  1204 History   First MD Initiated Contact with Patient 02/17/16 1228     Chief Complaint  Patient presents with  . Abscess      HPI Patient returns for recheck of cellulitis.  Was seen yesterday by myself.  Had infection to the right thigh area.  Was given Rocephin and gentamicin IV.  The redness is much improved.  Patient says symptomatically is improved.  We'll discharge on by mouth clindamycin have follow-up with his primary care doctor. Past Medical History  Diagnosis Date  . High blood pressure   . Asthma   . Arthritis   . Acid reflux disease   . High cholesterol   . Gout    Past Surgical History  Procedure Laterality Date  . Joint replacement    . Total hip arthroplasty    . Spinal fusion     Family History  Problem Relation Age of Onset  . Diabetes Other    Social History  Substance Use Topics  . Smoking status: Current Every Day Smoker -- 0.50 packs/day for 20 years    Types: Cigarettes  . Smokeless tobacco: Never Used  . Alcohol Use: Yes     Comment: occasionally    Review of Systems  All other systems reviewed and are negative.     Allergies  Review of patient's allergies indicates no known allergies.  Home Medications   Prior to Admission medications   Medication Sig Start Date End Date Taking? Authorizing Provider  albuterol (PROVENTIL HFA;VENTOLIN HFA) 108 (90 BASE) MCG/ACT inhaler Inhale 2 puffs into the lungs 4 (four) times daily. For shortness of breath or wheezing    Historical Provider, MD  atorvastatin (LIPITOR) 40 MG tablet Take 40 mg by mouth daily.    Historical Provider, MD  clindamycin (CLEOCIN) 300 MG capsule Take 1 capsule (300 mg total) by mouth 4 (four) times daily. 02/17/16   Nelva Nay, MD  HYDROcodone-acetaminophen (NORCO/VICODIN) 5-325 MG tablet Take 1 tablet by mouth every 4 (four) hours as needed. Patient not taking: Reported on 11/25/2015 07/08/15   Rolland Porter, MD  hydrOXYzine  (ATARAX/VISTARIL) 25 MG tablet Take 1 tablet (25 mg total) by mouth every 6 (six) hours as needed for itching. 03/15/15   Tammy Triplett, PA-C  levothyroxine (SYNTHROID, LEVOTHROID) 50 MCG tablet Take 50 mcg by mouth daily before breakfast.    Historical Provider, MD  lisinopril (PRINIVIL,ZESTRIL) 20 MG tablet Take 20 mg by mouth daily.    Historical Provider, MD  naproxen (NAPROSYN) 500 MG tablet Take 500 mg by mouth 2 (two) times daily with a meal.    Historical Provider, MD  omeprazole (PRILOSEC) 20 MG capsule Take 20 mg by mouth daily.    Historical Provider, MD  oxyCODONE (OXY IR/ROXICODONE) 5 MG immediate release tablet Take 5 mg by mouth every 6 (six) hours as needed for moderate pain.    Historical Provider, MD  permethrin (ELIMITE) 5 % cream Apply from neck down, leave on 14 hrs then wash off.  May re-apply in one week if needed Patient not taking: Reported on 11/25/2015 03/15/15   Tammy Triplett, PA-C  predniSONE (DELTASONE) 10 MG tablet Take 6 tabs daily by mouth for 2 day,  Then 5 tabs daily for 2 days,  4 tabs daily for 2 days,  3 tabs daily for 2 days,  2 tabs daily for 2 days,  Then 1 tab daily for 2 days. Patient not taking:  Reported on 11/25/2015 09/16/15   Burgess AmorJulie Idol, PA-C  tamsulosin (FLOMAX) 0.4 MG CAPS capsule Take 1 capsule (0.4 mg total) by mouth daily. 11/25/15   Donnetta HutchingBrian Cook, MD   BP 135/81 mmHg  Pulse 110  Temp(Src) 98.7 F (37.1 C) (Oral)  Ht 5\' 7"  (1.702 m)  Wt 248 lb (112.492 kg)  BMI 38.83 kg/m2  SpO2 94% Physical Exam  Constitutional: He is oriented to person, place, and time. He appears well-developed. No distress.  Cardiovascular: Regular rhythm.   Pulmonary/Chest: Effort normal.  Abdominal: Soft.  Musculoskeletal:       Legs: Neurological: He is alert and oriented to person, place, and time.  Skin: Skin is warm and dry.    ED Course  Procedures (including critical care time) Labs Review Labs Reviewed - No data to display  Imaging Review No results  found. I have personally reviewed and evaluated these images and lab results as part of my medical decision-making.   EKG Interpretation None      MDM   Final diagnoses:  Wound cellulitis        Nelva Nayobert Latunya Kissick, MD 02/17/16 1247

## 2016-02-17 NOTE — ED Notes (Signed)
Patient states he was seen yesterday for abscess. Was told to come back today to have it checked.

## 2016-02-17 NOTE — ED Notes (Signed)
Pt believes that area is better today from yesterday.  Area with redness

## 2016-02-17 NOTE — Discharge Instructions (Signed)
Cellulitis °Cellulitis is an infection of the skin and the tissue under the skin. The infected area is usually red and tender. This happens most often in the arms and lower legs. °HOME CARE  °· Take your antibiotic medicine as told. Finish the medicine even if you start to feel better. °· Keep the infected arm or leg raised (elevated). °· Put a warm cloth on the area up to 4 times per day. °· Only take medicines as told by your doctor. °· Keep all doctor visits as told. °GET HELP IF: °· You see red streaks on the skin coming from the infected area. °· Your red area gets bigger or turns a dark color. °· Your bone or joint under the infected area is painful after the skin heals. °· Your infection comes back in the same area or different area. °· You have a puffy (swollen) bump in the infected area. °· You have new symptoms. °· You have a fever. °GET HELP RIGHT AWAY IF:  °· You feel very sleepy. °· You throw up (vomit) or have watery poop (diarrhea). °· You feel sick and have muscle aches and pains. °  °This information is not intended to replace advice given to you by your health care provider. Make sure you discuss any questions you have with your health care provider. °  °Document Released: 03/03/2008 Document Revised: 06/06/2015 Document Reviewed: 12/01/2011 °Elsevier Interactive Patient Education ©2016 [employer] ° °

## 2016-06-10 ENCOUNTER — Encounter (HOSPITAL_COMMUNITY): Payer: Self-pay | Admitting: Emergency Medicine

## 2016-06-10 ENCOUNTER — Emergency Department (HOSPITAL_COMMUNITY)
Admission: EM | Admit: 2016-06-10 | Discharge: 2016-06-10 | Disposition: A | Discharge status: Home / Self Care | Payer: Medicaid Other | Attending: Emergency Medicine | Admitting: Emergency Medicine

## 2016-06-10 DIAGNOSIS — J45909 Unspecified asthma, uncomplicated: Secondary | ICD-10-CM | POA: Insufficient documentation

## 2016-06-10 DIAGNOSIS — F17210 Nicotine dependence, cigarettes, uncomplicated: Secondary | ICD-10-CM | POA: Diagnosis not present

## 2016-06-10 DIAGNOSIS — L739 Follicular disorder, unspecified: Secondary | ICD-10-CM | POA: Insufficient documentation

## 2016-06-10 DIAGNOSIS — L02811 Cutaneous abscess of head [any part, except face]: Secondary | ICD-10-CM | POA: Diagnosis present

## 2016-06-10 MED ORDER — SULFAMETHOXAZOLE-TRIMETHOPRIM 800-160 MG PO TABS: 1.0000 | ORAL_TABLET | Freq: Two times a day (BID) | ORAL | 0 refills | Status: AC

## 2016-06-10 NOTE — ED Notes (Signed)
Patient given discharge instruction, verbalized understand. Patient ambulatory out of the department.  

## 2016-06-10 NOTE — ED Notes (Signed)
PA at the bedside.

## 2016-06-10 NOTE — ED Triage Notes (Signed)
Pt c/o redness, swelling "bumps" to back pf head and behind left ear. nad noted.

## 2016-06-10 NOTE — ED Provider Notes (Signed)
[facility] Provider Note   CSN: [id number] Arrival date & time: 06/10/16  1318     History   Chief Complaint Chief Complaint  Patient presents with  . Abscess    HPI Ricky RossettiCharlie R Vazquez is a 50 y.o. male.  The history is provided by the patient. No language interpreter was used.  Abscess  Location:  Head/neck Abscess quality: redness and warmth   Red streaking: no   Progression:  Worsening Relieved by:  Nothing Worsened by:  Nothing Ineffective treatments:  None tried Associated symptoms: no fever   Pt reports lumps on his head after shaving.  Pt has psoriasis   Past Medical History:  Diagnosis Date  . Acid reflux disease   . Arthritis   . Asthma   . Gout   . High blood pressure   . High cholesterol     Patient Active Problem List   Diagnosis Date Noted  . ASEPTIC NECROSIS 09/16/2007  . HIGH BLOOD PRESSURE 08/17/2007    Past Surgical History:  Procedure Laterality Date  . JOINT REPLACEMENT    . SPINAL FUSION    . TOTAL HIP ARTHROPLASTY         Home Medications    Prior to Admission medications   Medication Sig Start Date End Date Taking? Authorizing Provider  albuterol (PROVENTIL HFA;VENTOLIN HFA) 108 (90 BASE) MCG/ACT inhaler Inhale 2 puffs into the lungs 4 (four) times daily. For shortness of breath or wheezing    Historical Provider, MD  atorvastatin (LIPITOR) 40 MG tablet Take 40 mg by mouth daily.    Historical Provider, MD  clindamycin (CLEOCIN) 300 MG capsule Take 1 capsule (300 mg total) by mouth 4 (four) times daily. 02/17/16   Nelva Nayobert Beaton, MD  HYDROcodone-acetaminophen (NORCO/VICODIN) 5-325 MG tablet Take 1 tablet by mouth every 4 (four) hours as needed. Patient not taking: Reported on 11/25/2015 07/08/15   Rolland PorterMark James, MD  hydrOXYzine (ATARAX/VISTARIL) 25 MG tablet Take 1 tablet (25 mg total) by mouth every 6 (six) hours as needed for itching. 03/15/15   Tammy Triplett, PA-C  levothyroxine (SYNTHROID, LEVOTHROID) 50 MCG tablet Take 50  mcg by mouth daily before breakfast.    Historical Provider, MD  lisinopril (PRINIVIL,ZESTRIL) 20 MG tablet Take 20 mg by mouth daily.    Historical Provider, MD  naproxen (NAPROSYN) 500 MG tablet Take 500 mg by mouth 2 (two) times daily with a meal.    Historical Provider, MD  omeprazole (PRILOSEC) 20 MG capsule Take 20 mg by mouth daily.    Historical Provider, MD  oxyCODONE (OXY IR/ROXICODONE) 5 MG immediate release tablet Take 5 mg by mouth every 6 (six) hours as needed for moderate pain.    Historical Provider, MD  permethrin (ELIMITE) 5 % cream Apply from neck down, leave on 14 hrs then wash off.  May re-apply in one week if needed Patient not taking: Reported on 11/25/2015 03/15/15   Tammy Triplett, PA-C  predniSONE (DELTASONE) 10 MG tablet Take 6 tabs daily by mouth for 2 day,  Then 5 tabs daily for 2 days,  4 tabs daily for 2 days,  3 tabs daily for 2 days,  2 tabs daily for 2 days,  Then 1 tab daily for 2 days. Patient not taking: Reported on 11/25/2015 09/16/15   Burgess AmorJulie Idol, PA-C  sulfamethoxazole-trimethoprim (BACTRIM DS,SEPTRA DS) 800-160 MG tablet Take 1 tablet by mouth 2 (two) times daily. 06/10/16 06/17/16  Elson AreasLeslie K Wendolyn Raso, PA-C  tamsulosin (FLOMAX) 0.4 MG CAPS capsule Take  1 capsule (0.4 mg total) by mouth daily. 11/25/15   Donnetta Hutching, MD    Family History Family History  Problem Relation Age of Onset  . Diabetes Other     Social History Social History  Substance Use Topics  . Smoking status: Current Every Day Smoker    Packs/day: 0.50    Years: 20.00    Types: Cigarettes  . Smokeless tobacco: Never Used  . Alcohol use Yes     Comment: occasionally     Allergies   Review of patient's allergies indicates no known allergies.   Review of Systems Review of Systems  Constitutional: Negative for fever.  All other systems reviewed and are negative.    Physical Exam Updated Vital Signs BP 170/99 (BP Location: Left Arm)   Pulse 96   Temp 98.1 F (36.7 C) (Oral)    Resp 18   Ht 5\' 7"  (1.702 m)   Wt 115.2 kg   SpO2 98%   BMI 39.78 kg/m   Physical Exam  Constitutional: He appears well-developed and well-nourished.  HENT:  Head: Normocephalic and atraumatic.  Eyes: Conjunctivae are normal.  Neck: Neck supple.  Cardiovascular: Normal rate and regular rhythm.   No murmur heard. Pulmonary/Chest: Effort normal and breath sounds normal. No respiratory distress.  Abdominal: Soft. There is no tenderness.  Musculoskeletal: He exhibits no edema.  Neurological: He is alert.  Skin: Skin is warm and dry.  2 small crusted areas one behind left ear, one back of head  Psychiatric: He has a normal mood and affect.  Nursing note and vitals reviewed.    ED Treatments / Results  Labs (all labs ordered are listed, but only abnormal results are displayed) Labs Reviewed - No data to display  EKG  EKG Interpretation None       Radiology No results found.  Procedures Procedures (including critical care time)  Medications Ordered in ED Medications - No data to display   Initial Impression / Assessment and Plan / ED Course  I have reviewed the triage vital signs and the nursing notes.  Pertinent labs & imaging results that were available during my care of the patient were reviewed by me and considered in my medical decision making (see chart for details).  Clinical Course      Final Clinical Impressions(s) / ED Diagnoses   Final diagnoses:  Folliculitis    New Prescriptions Discharge Medication List as of 06/10/2016  1:47 PM    START taking these medications   Details  sulfamethoxazole-trimethoprim (BACTRIM DS,SEPTRA DS) 800-160 MG tablet Take 1 tablet by mouth 2 (two) times daily., Starting Tue 06/10/2016, Until Tue 06/17/2016, Print      An After Visit Summary was printed and given to the patient.   Lonia Skinner Coffeen, PA-C 06/10/16 1607    Mancel Bale, MD 06/11/16 [phone]

## 2016-11-20 ENCOUNTER — Other Ambulatory Visit (HOSPITAL_COMMUNITY): Payer: Self-pay | Admitting: Family Medicine

## 2016-11-20 DIAGNOSIS — G44029 Chronic cluster headache, not intractable: Secondary | ICD-10-CM

## 2016-11-26 ENCOUNTER — Ambulatory Visit (HOSPITAL_COMMUNITY)
Admission: RE | Admit: 2016-11-26 | Discharge: 2016-11-26 | Disposition: A | Discharge status: Home / Self Care | Payer: Medicaid Other | Source: Ambulatory Visit | Attending: Family Medicine | Admitting: Family Medicine

## 2016-11-26 DIAGNOSIS — R42 Dizziness and giddiness: Secondary | ICD-10-CM | POA: Diagnosis not present

## 2016-11-26 DIAGNOSIS — R930 Abnormal findings on diagnostic imaging of skull and head, not elsewhere classified: Secondary | ICD-10-CM | POA: Insufficient documentation

## 2016-11-26 DIAGNOSIS — G43909 Migraine, unspecified, not intractable, without status migrainosus: Secondary | ICD-10-CM | POA: Insufficient documentation

## 2016-11-26 DIAGNOSIS — G44029 Chronic cluster headache, not intractable: Secondary | ICD-10-CM

## 2017-03-20 ENCOUNTER — Emergency Department (HOSPITAL_COMMUNITY)
Admission: EM | Admit: 2017-03-20 | Discharge: 2017-03-20 | Disposition: A | Discharge status: Home / Self Care | Payer: Medicaid Other | Attending: Emergency Medicine | Admitting: Emergency Medicine

## 2017-03-20 DIAGNOSIS — J45909 Unspecified asthma, uncomplicated: Secondary | ICD-10-CM | POA: Diagnosis not present

## 2017-03-20 DIAGNOSIS — W57XXXA Bitten or stung by nonvenomous insect and other nonvenomous arthropods, initial encounter: Secondary | ICD-10-CM | POA: Diagnosis not present

## 2017-03-20 DIAGNOSIS — S80862A Insect bite (nonvenomous), left lower leg, initial encounter: Secondary | ICD-10-CM | POA: Diagnosis present

## 2017-03-20 DIAGNOSIS — Y999 Unspecified external cause status: Secondary | ICD-10-CM | POA: Insufficient documentation

## 2017-03-20 DIAGNOSIS — Y929 Unspecified place or not applicable: Secondary | ICD-10-CM | POA: Insufficient documentation

## 2017-03-20 DIAGNOSIS — Y939 Activity, unspecified: Secondary | ICD-10-CM | POA: Insufficient documentation

## 2017-03-20 DIAGNOSIS — F17210 Nicotine dependence, cigarettes, uncomplicated: Secondary | ICD-10-CM | POA: Diagnosis not present

## 2017-03-20 LAB — CBC WITH DIFFERENTIAL/PLATELET
Basophils Absolute: 0.1 10*3/uL (ref 0.0–0.1)
Basophils Relative: 1 %
EOS ABS: 0.4 10*3/uL (ref 0.0–0.7)
Eosinophils Relative: 4 %
HEMATOCRIT: 44.9 % (ref 39.0–52.0)
HEMOGLOBIN: 15.4 g/dL (ref 13.0–17.0)
LYMPHS ABS: 2.4 10*3/uL (ref 0.7–4.0)
LYMPHS PCT: 21 %
MCH: 31.5 pg (ref 26.0–34.0)
MCHC: 34.3 g/dL (ref 30.0–36.0)
MCV: 91.8 fL (ref 78.0–100.0)
MONOS PCT: 13 %
Monocytes Absolute: 1.4 10*3/uL — ABNORMAL HIGH (ref 0.1–1.0)
NEUTROS ABS: 6.8 10*3/uL (ref 1.7–7.7)
NEUTROS PCT: 61 %
Platelets: 318 10*3/uL (ref 150–400)
RBC: 4.89 MIL/uL (ref 4.22–5.81)
RDW: 13.6 % (ref 11.5–15.5)
WBC: 11.1 10*3/uL — AB (ref 4.0–10.5)

## 2017-03-20 LAB — COMPREHENSIVE METABOLIC PANEL
ALK PHOS: 161 U/L — AB (ref 38–126)
ALT: 37 U/L (ref 17–63)
AST: 33 U/L (ref 15–41)
Albumin: 4 g/dL (ref 3.5–5.0)
Anion gap: 11 (ref 5–15)
BILIRUBIN TOTAL: 0.4 mg/dL (ref 0.3–1.2)
BUN: 7 mg/dL (ref 6–20)
CALCIUM: 9.3 mg/dL (ref 8.9–10.3)
CO2: 23 mmol/L (ref 22–32)
CREATININE: 0.76 mg/dL (ref 0.61–1.24)
Chloride: 104 mmol/L (ref 101–111)
GFR calc non Af Amer: >60 mL/min (ref >60)
Glucose, Bld: 106 mg/dL — ABNORMAL HIGH (ref 65–99)
Potassium: 3.5 mmol/L (ref 3.5–5.1)
SODIUM: 138 mmol/L (ref 135–145)
TOTAL PROTEIN: 7.5 g/dL (ref 6.5–8.1)

## 2017-03-20 MED ORDER — VANCOMYCIN HCL IN DEXTROSE 1-5 GM/200ML-% IV SOLN
1000.0000 mg | Freq: Once | INTRAVENOUS | Status: AC
  Administered 2017-03-20: 1000 mg via INTRAVENOUS
  Filled 2017-03-20: qty 200

## 2017-03-20 MED ORDER — SULFAMETHOXAZOLE-TRIMETHOPRIM 800-160 MG PO TABS: 1.0000 | ORAL_TABLET | Freq: Two times a day (BID) | ORAL | 0 refills | Status: AC

## 2017-03-20 MED ORDER — DIPHENHYDRAMINE HCL 50 MG/ML IJ SOLN
25.0000 mg | Freq: Once | INTRAMUSCULAR | Status: AC
  Administered 2017-03-20: 25 mg via INTRAVENOUS
  Filled 2017-03-20: qty 1

## 2017-03-20 NOTE — Discharge Instructions (Signed)
Use benadryl for itching.  Follow up with your md next week

## 2017-03-20 NOTE — ED Provider Notes (Signed)
[facility] Provider Note   CSN: [id number] Arrival date & time: 03/20/17  1440     History   Chief Complaint Chief Complaint  Patient presents with  . Insect Bite    HPI [name] is a 51 y.o. male.  Patient states that he believes he was bit by an insect on his left lower leg. He has some pain swelling and itching   The history is provided by the patient. No language interpreter was used.  Rash   This is a new problem. The current episode started 6 to 12 hours ago. The problem has not changed since onset.The problem is associated with nothing. There has been no fever. The rash is present on the left lower leg. The pain is at a severity of 5/10. The pain is moderate. The pain has been constant since onset.    Past Medical History:  Diagnosis Date  . Acid reflux disease   . Arthritis   . Asthma   . Gout   . High blood pressure   . High cholesterol     Patient Active Problem List   Diagnosis Date Noted  . ASEPTIC NECROSIS 09/16/2007  . HIGH BLOOD PRESSURE 08/17/2007    Past Surgical History:  Procedure Laterality Date  . JOINT REPLACEMENT    . SPINAL FUSION    . TOTAL HIP ARTHROPLASTY         Home Medications    Prior to Admission medications   Medication Sig Start Date End Date Taking? Authorizing Provider  albuterol (PROVENTIL HFA;VENTOLIN HFA) 108 (90 BASE) MCG/ACT inhaler Inhale 2 puffs into the lungs 4 (four) times daily. For shortness of breath or wheezing    [provider]  atorvastatin (LIPITOR) 40 MG tablet Take 40 mg by mouth daily.    [provider]  clindamycin (CLEOCIN) 300 MG capsule Take 1 capsule (300 mg total) by mouth 4 (four) times daily. 02/17/16   Nelva Nay, MD  HYDROcodone-acetaminophen (NORCO/VICODIN) 5-325 MG tablet Take 1 tablet by mouth every 4 (four) hours as needed. Patient not taking: Reported on 11/25/2015 07/08/15   Rolland Porter, MD  hydrOXYzine (ATARAX/VISTARIL) 25 MG tablet Take 1 tablet  (25 mg total) by mouth every 6 (six) hours as needed for itching. 03/15/15   Triplett, Tammy, PA-C  levothyroxine (SYNTHROID, LEVOTHROID) 50 MCG tablet Take 50 mcg by mouth daily before breakfast.    [provider]  lisinopril (PRINIVIL,ZESTRIL) 20 MG tablet Take 20 mg by mouth daily.    [provider]  naproxen (NAPROSYN) 500 MG tablet Take 500 mg by mouth 2 (two) times daily with a meal.    [provider]  omeprazole (PRILOSEC) 20 MG capsule Take 20 mg by mouth daily.    [provider]  oxyCODONE (OXY IR/ROXICODONE) 5 MG immediate release tablet Take 5 mg by mouth every 6 (six) hours as needed for moderate pain.    [provider]  permethrin (ELIMITE) 5 % cream Apply from neck down, leave on 14 hrs then wash off.  May re-apply in one week if needed Patient not taking: Reported on 11/25/2015 03/15/15   Triplett, Tammy, PA-C  predniSONE (DELTASONE) 10 MG tablet Take 6 tabs daily by mouth for 2 day,  Then 5 tabs daily for 2 days,  4 tabs daily for 2 days,  3 tabs daily for 2 days,  2 tabs daily for 2 days,  Then 1 tab daily for 2 days. Patient not taking: Reported on 11/25/2015  09/16/15   Burgess AmorIdol, Julie, PA-C  sulfamethoxazole-trimethoprim (BACTRIM DS,SEPTRA DS) 800-160 MG tablet Take 1 tablet by mouth 2 (two) times daily. 03/20/17 03/27/17  Bethann BerkshireZammit, Jonique Kulig, MD  tamsulosin (FLOMAX) 0.4 MG CAPS capsule Take 1 capsule (0.4 mg total) by mouth daily. 11/25/15   Donnetta Hutchingook, Brian, MD    Family History Family History  Problem Relation Age of Onset  . Diabetes Other     Social History Social History  Substance Use Topics  . Smoking status: Current Every Day Smoker    Packs/day: 0.50    Years: 20.00    Types: Cigarettes  . Smokeless tobacco: Never Used  . Alcohol use Yes     Comment: occasionally     Allergies   Patient has no known allergies.   Review of Systems Review of Systems  Constitutional: Negative for appetite change and fatigue.  HENT:  Negative for congestion, ear discharge and sinus pressure.   Eyes: Negative for discharge.  Respiratory: Negative for cough.   Cardiovascular: Negative for chest pain.  Gastrointestinal: Negative for abdominal pain and diarrhea.  Genitourinary: Negative for frequency and hematuria.  Musculoskeletal: Negative for back pain.  Skin: Positive for rash.  Neurological: Negative for seizures and headaches.  Psychiatric/Behavioral: Negative for hallucinations.     Physical Exam Updated Vital Signs BP (!) 175/107 (BP Location: Left Arm)   Pulse (!) 115   Temp 98.1 F (36.7 C) (Oral)   Resp 16   Ht 5\' 7"  (1.702 m)   Wt 112.5 kg (248 lb)   SpO2 100%   BMI 38.84 kg/m   Physical Exam  Constitutional: He is oriented to person, place, and time. He appears well-developed.  HENT:  Head: Normocephalic.  Eyes: Conjunctivae and EOM are normal. No scleral icterus.  Neck: Neck supple. No thyromegaly present.  Cardiovascular: Normal rate and regular rhythm.  Exam reveals no gallop and no friction rub.   No murmur heard. Pulmonary/Chest: No stridor. He has no wheezes. He has no rales. He exhibits no tenderness.  Abdominal: He exhibits no distension. There is no tenderness. There is no rebound.  Musculoskeletal: Normal range of motion. He exhibits no edema.  Patient has tenderness to the anterior part of the lower leg. Some redness and some swelling.  Lymphadenopathy:    He has no cervical adenopathy.  Neurological: He is oriented to person, place, and time. He exhibits normal muscle tone. Coordination normal.  Skin: No rash noted. No erythema.  Psychiatric: He has a normal mood and affect. His behavior is normal.     ED Treatments / Results  Labs (all labs ordered are listed, but only abnormal results are displayed) Labs Reviewed  CBC WITH DIFFERENTIAL/PLATELET - Abnormal; Notable for the following:       Result Value   WBC 11.1 (*)    Monocytes Absolute 1.4 (*)    All other components  within normal limits  COMPREHENSIVE METABOLIC PANEL - Abnormal; Notable for the following:    Glucose, Bld 106 (*)    Alkaline Phosphatase 161 (*)    All other components within normal limits    EKG  EKG Interpretation None       Radiology No results found.  Procedures Procedures (including critical care time)  Medications Ordered in ED Medications  vancomycin (VANCOCIN) IVPB 1000 mg/200 mL premix (1,000 mg Intravenous New Bag/Given 03/20/17 1641)  diphenhydrAMINE (BENADRYL) injection 25 mg (25 mg Intravenous Given 03/20/17 1638)     Initial Impression / Assessment and Plan / ED  Course  I have reviewed the triage vital signs and the nursing notes.  Pertinent labs & imaging results that were available during my care of the patient were reviewed by me and considered in my medical decision making (see chart for details).     Possible cellulitis to the left lower leg. Patient will be placed on Bactrim and will follow-up with his PCP  Final Clinical Impressions(s) / ED Diagnoses   Final diagnoses:  Insect bite, initial encounter    New Prescriptions New Prescriptions   SULFAMETHOXAZOLE-TRIMETHOPRIM (BACTRIM DS,SEPTRA DS) 800-160 MG TABLET    Take 1 tablet by mouth 2 (two) times daily.     Bethann Berkshire, MD 03/20/17 [phone]

## 2017-03-20 NOTE — ED Notes (Signed)
Gave patient and family member drink as requested.

## 2017-03-20 NOTE — ED Triage Notes (Signed)
Insect bite to L leg  Noticed 1 hour ago  Dr Janna Archondiego

## 2017-03-20 NOTE — ED Triage Notes (Signed)
Pt has other complaints including left leg pain going up into groin as well as itching to site of insect bite.

## 2017-06-17 NOTE — H&P (Signed)
HISTORY AND PHYSICAL  [name] is a 51 y.o. male patient with CC: painful teeth. Referred by general dentist for multiple extractions  No diagnosis found.  Past Medical History:  Diagnosis Date  . Acid reflux disease   . Arthritis   . Asthma   . Gout   . High blood pressure   . High cholesterol     No current facility-administered medications for this encounter.    Current Outpatient Prescriptions  Medication Sig Dispense Refill  . albuterol (PROVENTIL HFA;VENTOLIN HFA) 108 (90 BASE) MCG/ACT inhaler Inhale 2 puffs into the lungs 4 (four) times daily. For shortness of breath or wheezing    . atorvastatin (LIPITOR) 40 MG tablet Take 40 mg by mouth daily.    . clindamycin (CLEOCIN) 300 MG capsule Take 1 capsule (300 mg total) by mouth 4 (four) times daily. 40 capsule 0  . HYDROcodone-acetaminophen (NORCO/VICODIN) 5-325 MG tablet Take 1 tablet by mouth every 4 (four) hours as needed. (Patient not taking: Reported on 11/25/2015) 10 tablet 0  . hydrOXYzine (ATARAX/VISTARIL) 25 MG tablet Take 1 tablet (25 mg total) by mouth every 6 (six) hours as needed for itching. 12 tablet 0  . levothyroxine (SYNTHROID, LEVOTHROID) 50 MCG tablet Take 50 mcg by mouth daily before breakfast.    . lisinopril (PRINIVIL,ZESTRIL) 20 MG tablet Take 20 mg by mouth daily.    . naproxen (NAPROSYN) 500 MG tablet Take 500 mg by mouth 2 (two) times daily with a meal.    . omeprazole (PRILOSEC) 20 MG capsule Take 20 mg by mouth daily.    Marland Kitchen oxyCODONE (OXY IR/ROXICODONE) 5 MG immediate release tablet Take 5 mg by mouth every 6 (six) hours as needed for moderate pain.    Marland Kitchen permethrin (ELIMITE) 5 % cream Apply from neck down, leave on 14 hrs then wash off.  May re-apply in one week if needed (Patient not taking: Reported on 11/25/2015) 60 g 1  . predniSONE (DELTASONE) 10 MG tablet Take 6 tabs daily by mouth for 2 day,  Then 5 tabs daily for 2 days,  4 tabs daily for 2 days,  3 tabs daily for 2 days,  2 tabs daily for  2 days,  Then 1 tab daily for 2 days. (Patient not taking: Reported on 11/25/2015) 42 tablet 0  . tamsulosin (FLOMAX) 0.4 MG CAPS capsule Take 1 capsule (0.4 mg total) by mouth daily. 30 capsule 0   No Known Allergies Active Problems:   * No active hospital problems. *  Vitals: There were no vitals taken for this visit. Lab results:No results found for this or any previous visit (from the past 24 hour(s)). Radiology Results: No results found. General appearance: alert, cooperative and morbidly obese Head: Normocephalic, without obvious abnormality, atraumatic Eyes: negative Nose: Nares normal. Septum midline. Mucosa normal. No drainage or sinus tenderness. Throat: multiple carious teeth, right mandibular lingual torus. no masses, lesions, trismus Neck: no adenopathy and thyroid not enlarged, symmetric, no tenderness/mass/nodules Resp: clear to auscultation bilaterally Cardio: regular rate and rhythm, S1, S2 normal, no murmur, click, rub or gallop  Assessment: Multiple nonrestorable teeth secondary to dental caries. Right lingual torus  Plan:Multiple extractions with alveoloplasty. Removal lingual torus. GA.Nasal.  Day surgery   [name] M 06/17/2017

## 2017-06-18 ENCOUNTER — Encounter (HOSPITAL_COMMUNITY): Payer: Self-pay | Admitting: *Deleted

## 2017-06-18 NOTE — Progress Notes (Signed)
Spoke with patient regarding medical/surgical history and pre-op instructions. Verbalized understanding.

## 2017-06-19 ENCOUNTER — Ambulatory Visit (HOSPITAL_COMMUNITY)
Admission: RE | Admit: 2017-06-19 | Discharge: 2017-06-19 | Disposition: A | Discharge status: Home / Self Care | Payer: Medicaid Other | Source: Ambulatory Visit | Attending: Oral Surgery | Admitting: Oral Surgery

## 2017-06-19 ENCOUNTER — Ambulatory Visit (HOSPITAL_COMMUNITY): Payer: Medicaid Other | Admitting: Anesthesiology

## 2017-06-19 ENCOUNTER — Encounter (HOSPITAL_COMMUNITY)
Admission: RE | Disposition: A | Discharge status: Home / Self Care | Payer: Self-pay | Source: Ambulatory Visit | Attending: Oral Surgery

## 2017-06-19 ENCOUNTER — Encounter (HOSPITAL_COMMUNITY): Payer: Self-pay | Admitting: Certified Registered"

## 2017-06-19 DIAGNOSIS — E7800 Pure hypercholesterolemia, unspecified: Secondary | ICD-10-CM | POA: Diagnosis not present

## 2017-06-19 DIAGNOSIS — J45909 Unspecified asthma, uncomplicated: Secondary | ICD-10-CM | POA: Insufficient documentation

## 2017-06-19 DIAGNOSIS — Z79899 Other long term (current) drug therapy: Secondary | ICD-10-CM | POA: Diagnosis not present

## 2017-06-19 DIAGNOSIS — M270 Developmental disorders of jaws: Secondary | ICD-10-CM | POA: Insufficient documentation

## 2017-06-19 DIAGNOSIS — K219 Gastro-esophageal reflux disease without esophagitis: Secondary | ICD-10-CM | POA: Insufficient documentation

## 2017-06-19 DIAGNOSIS — M109 Gout, unspecified: Secondary | ICD-10-CM | POA: Insufficient documentation

## 2017-06-19 DIAGNOSIS — I10 Essential (primary) hypertension: Secondary | ICD-10-CM | POA: Diagnosis not present

## 2017-06-19 DIAGNOSIS — K029 Dental caries, unspecified: Secondary | ICD-10-CM | POA: Diagnosis not present

## 2017-06-19 DIAGNOSIS — F17200 Nicotine dependence, unspecified, uncomplicated: Secondary | ICD-10-CM | POA: Insufficient documentation

## 2017-06-19 HISTORY — PX: MULTIPLE EXTRACTIONS WITH ALVEOLOPLASTY: SHX5342

## 2017-06-19 HISTORY — DX: Headache: R51

## 2017-06-19 HISTORY — DX: Psoriasis, unspecified: L40.9

## 2017-06-19 HISTORY — DX: Headache, unspecified: R51.9

## 2017-06-19 LAB — CBC
HEMATOCRIT: 43.9 % (ref 39.0–52.0)
Hemoglobin: 14.7 g/dL (ref 13.0–17.0)
MCH: 30.3 pg (ref 26.0–34.0)
MCHC: 33.5 g/dL (ref 30.0–36.0)
MCV: 90.5 fL (ref 78.0–100.0)
PLATELETS: 356 10*3/uL (ref 150–400)
RBC: 4.85 MIL/uL (ref 4.22–5.81)
RDW: 14 % (ref 11.5–15.5)
WBC: 7.4 10*3/uL (ref 4.0–10.5)

## 2017-06-19 LAB — BASIC METABOLIC PANEL
Anion gap: 8 (ref 5–15)
BUN: 6 mg/dL (ref 6–20)
CHLORIDE: 106 mmol/L (ref 101–111)
CO2: 23 mmol/L (ref 22–32)
CREATININE: 0.76 mg/dL (ref 0.61–1.24)
Calcium: 8.7 mg/dL — ABNORMAL LOW (ref 8.9–10.3)
GFR calc Af Amer: >60 mL/min (ref >60)
GFR calc non Af Amer: >60 mL/min (ref >60)
GLUCOSE: 91 mg/dL (ref 65–99)
Potassium: 3.6 mmol/L (ref 3.5–5.1)
SODIUM: 137 mmol/L (ref 135–145)

## 2017-06-19 SURGERY — MULTIPLE EXTRACTION WITH ALVEOLOPLASTY
Anesthesia: General | Site: Mouth

## 2017-06-19 MED ORDER — PROPOFOL 10 MG/ML IV BOLUS
INTRAVENOUS | Status: DC | PRN
  Administered 2017-06-19: 170 mg via INTRAVENOUS
  Administered 2017-06-19: 30 mg via INTRAVENOUS

## 2017-06-19 MED ORDER — LACTATED RINGERS IV SOLN
INTRAVENOUS | Status: DC
  Administered 2017-06-19: 07:00:00 via INTRAVENOUS

## 2017-06-19 MED ORDER — SODIUM CHLORIDE 0.9 % IR SOLN
Status: DC | PRN
  Administered 2017-06-19: 1000 mL

## 2017-06-19 MED ORDER — ONDANSETRON HCL 4 MG/2ML IJ SOLN
INTRAMUSCULAR | Status: AC
  Filled 2017-06-19: qty 2

## 2017-06-19 MED ORDER — DEXAMETHASONE SODIUM PHOSPHATE 10 MG/ML IJ SOLN
INTRAMUSCULAR | Status: AC
  Filled 2017-06-19: qty 1

## 2017-06-19 MED ORDER — ONDANSETRON HCL 4 MG/2ML IJ SOLN
INTRAMUSCULAR | Status: DC | PRN
  Administered 2017-06-19: 4 mg via INTRAVENOUS

## 2017-06-19 MED ORDER — MIDAZOLAM HCL 5 MG/5ML IJ SOLN
INTRAMUSCULAR | Status: DC | PRN
  Administered 2017-06-19: 2 mg via INTRAVENOUS

## 2017-06-19 MED ORDER — LIDOCAINE-EPINEPHRINE 2 %-1:100000 IJ SOLN
INTRAMUSCULAR | Status: DC | PRN
  Administered 2017-06-19: 13 mL via INTRADERMAL

## 2017-06-19 MED ORDER — CEFAZOLIN SODIUM-DEXTROSE 2-4 GM/100ML-% IV SOLN
2.0000 g | INTRAVENOUS | Status: AC
  Administered 2017-06-19: 2 g via INTRAVENOUS
  Filled 2017-06-19: qty 100

## 2017-06-19 MED ORDER — OXYMETAZOLINE HCL 0.05 % NA SOLN
NASAL | Status: DC | PRN
  Administered 2017-06-19: 1

## 2017-06-19 MED ORDER — IBUPROFEN 800 MG PO TABS: 800.0000 mg | ORAL_TABLET | Freq: Three times a day (TID) | ORAL | 0 refills | Status: DC | PRN

## 2017-06-19 MED ORDER — SUGAMMADEX SODIUM 200 MG/2ML IV SOLN
INTRAVENOUS | Status: DC | PRN
  Administered 2017-06-19: 200 mg via INTRAVENOUS

## 2017-06-19 MED ORDER — ROCURONIUM BROMIDE 100 MG/10ML IV SOLN
INTRAVENOUS | Status: DC | PRN
  Administered 2017-06-19: 30 mg via INTRAVENOUS

## 2017-06-19 MED ORDER — MIDAZOLAM HCL 2 MG/2ML IJ SOLN
INTRAMUSCULAR | Status: AC
  Filled 2017-06-19: qty 2

## 2017-06-19 MED ORDER — SUGAMMADEX SODIUM 200 MG/2ML IV SOLN
INTRAVENOUS | Status: AC
  Filled 2017-06-19: qty 2

## 2017-06-19 MED ORDER — FENTANYL CITRATE (PF) 100 MCG/2ML IJ SOLN
INTRAMUSCULAR | Status: DC | PRN
  Administered 2017-06-19: 100 ug via INTRAVENOUS

## 2017-06-19 MED ORDER — OXYMETAZOLINE HCL 0.05 % NA SOLN
NASAL | Status: AC
  Filled 2017-06-19: qty 15

## 2017-06-19 MED ORDER — PROPOFOL 10 MG/ML IV BOLUS
INTRAVENOUS | Status: AC
  Filled 2017-06-19: qty 20

## 2017-06-19 MED ORDER — LIDOCAINE 2% (20 MG/ML) 5 ML SYRINGE
INTRAMUSCULAR | Status: AC
  Filled 2017-06-19: qty 5

## 2017-06-19 MED ORDER — ROCURONIUM BROMIDE 10 MG/ML (PF) SYRINGE
PREFILLED_SYRINGE | INTRAVENOUS | Status: AC
  Filled 2017-06-19: qty 5

## 2017-06-19 MED ORDER — LIDOCAINE 2% (20 MG/ML) 5 ML SYRINGE
INTRAMUSCULAR | Status: DC | PRN
  Administered 2017-06-19: 60 mg via INTRAVENOUS

## 2017-06-19 MED ORDER — CYCLOBENZAPRINE HCL 10 MG PO TABS: 10.0000 mg | ORAL_TABLET | Freq: Three times a day (TID) | ORAL | 0 refills | Status: DC | PRN

## 2017-06-19 MED ORDER — 0.9 % SODIUM CHLORIDE (POUR BTL) OPTIME
TOPICAL | Status: DC | PRN
  Administered 2017-06-19: 1000 mL

## 2017-06-19 MED ORDER — LIDOCAINE-EPINEPHRINE 2 %-1:100000 IJ SOLN
INTRAMUSCULAR | Status: AC
  Filled 2017-06-19: qty 1

## 2017-06-19 MED ORDER — SUCCINYLCHOLINE CHLORIDE 20 MG/ML IJ SOLN
INTRAMUSCULAR | Status: DC | PRN
  Administered 2017-06-19: 100 mg via INTRAVENOUS

## 2017-06-19 MED ORDER — FENTANYL CITRATE (PF) 250 MCG/5ML IJ SOLN
INTRAMUSCULAR | Status: AC
  Filled 2017-06-19: qty 5

## 2017-06-19 MED ORDER — DEXAMETHASONE SODIUM PHOSPHATE 10 MG/ML IJ SOLN
INTRAMUSCULAR | Status: DC | PRN
  Administered 2017-06-19: 10 mg via INTRAVENOUS

## 2017-06-19 SURGICAL SUPPLY — 32 items
BUR CROSS CUT FISSURE 1.6 (BURR) ×2 IMPLANT
BUR CROSS CUT FISSURE 1.6MM (BURR) ×1
BUR EGG ELITE 4.0 (BURR) ×2 IMPLANT
BUR EGG ELITE 4.0MM (BURR) ×1
CANISTER SUCT 3000ML PPV (MISCELLANEOUS) ×3 IMPLANT
COVER SURGICAL LIGHT HANDLE (MISCELLANEOUS) ×3 IMPLANT
CRADLE DONUT ADULT HEAD (MISCELLANEOUS) ×3 IMPLANT
DECANTER SPIKE VIAL GLASS SM (MISCELLANEOUS) IMPLANT
DRAPE U-SHAPE 76X120 STRL (DRAPES) ×3 IMPLANT
FLUID NSS /IRRIG 1000 ML XXX (MISCELLANEOUS) ×3 IMPLANT
GAUZE PACKING FOLDED 2  STR (GAUZE/BANDAGES/DRESSINGS) ×2
GAUZE PACKING FOLDED 2 STR (GAUZE/BANDAGES/DRESSINGS) ×1 IMPLANT
GLOVE BIO SURGEON STRL SZ 6.5 (GLOVE) ×2 IMPLANT
GLOVE BIO SURGEON STRL SZ7.5 (GLOVE) ×3 IMPLANT
GLOVE BIO SURGEONS STRL SZ 6.5 (GLOVE) ×1
GLOVE BIOGEL PI IND STRL 7.0 (GLOVE) IMPLANT
GLOVE BIOGEL PI INDICATOR 7.0 (GLOVE)
GOWN STRL REUS W/ TWL LRG LVL3 (GOWN DISPOSABLE) ×1 IMPLANT
GOWN STRL REUS W/ TWL XL LVL3 (GOWN DISPOSABLE) ×1 IMPLANT
GOWN STRL REUS W/TWL LRG LVL3 (GOWN DISPOSABLE) ×3
GOWN STRL REUS W/TWL XL LVL3 (GOWN DISPOSABLE) ×3
KIT BASIN OR (CUSTOM PROCEDURE TRAY) ×3 IMPLANT
KIT ROOM TURNOVER OR (KITS) ×3 IMPLANT
NEEDLE 22X1 1/2 (OR ONLY) (NEEDLE) ×6 IMPLANT
NEEDLE 27GAX1X1/2 (NEEDLE) IMPLANT
NS IRRIG 1000ML POUR BTL (IV SOLUTION) ×3 IMPLANT
PAD ARMBOARD 7.5X6 YLW CONV (MISCELLANEOUS) ×3 IMPLANT
SUT CHROMIC 3 0 PS 2 (SUTURE) ×3 IMPLANT
SYR CONTROL 10ML LL (SYRINGE) ×3 IMPLANT
TRAY ENT MC OR (CUSTOM PROCEDURE TRAY) ×3 IMPLANT
TUBING IRRIGATION (MISCELLANEOUS) ×3 IMPLANT
YANKAUER SUCT BULB TIP NO VENT (SUCTIONS) ×3 IMPLANT

## 2017-06-19 NOTE — Anesthesia Procedure Notes (Signed)
Procedure Name: Intubation Date/Time: 06/19/2017 9:11 AM Performed by: Melina Copa, Junko Ohagan R Pre-anesthesia Checklist: Patient identified, Emergency Drugs available, Suction available and Patient being monitored Patient Re-evaluated:Patient Re-evaluated prior to induction Oxygen Delivery Method: Circle System Utilized Preoxygenation: Pre-oxygenation with 100% oxygen Induction Type: IV induction Ventilation: Mask ventilation without difficulty Laryngoscope Size: Mac, 3 and Miller Grade View: Grade II Nasal Tubes: Nasal Rae, Nasal prep performed, Left and Magill forceps- large, utilized Tube size: 7.5 mm Number of attempts: 3 (First attempt with Mac 3 and grade II view, ETT into esophagus, ETT withdrawn into oropharynx and mask ventilation, Grade III view with Miller 3, ETT removed completely, mask ventilation additional meds, Dr. Marcie Bal with Mac 3, ETT though vc with [place].) Placement Confirmation: ETT inserted through vocal cords under direct vision,  positive ETCO2 and breath sounds checked- equal and bilateral Secured at: 28 cm Tube secured with: Tape Dental Injury: Teeth and Oropharynx as per pre-operative assessment

## 2017-06-19 NOTE — Transfer of Care (Signed)
Immediate Anesthesia Transfer of Care Note  Patient: [name]  Procedure(s) Performed: Procedure(s): MULTIPLE EXTRACTION WITH ALVEOLOPLASTY (N/A)  Patient Location: PACU  Anesthesia Type:General  Level of Consciousness: awake, oriented and patient cooperative  Airway & Oxygen Therapy: Patient Spontanous Breathing and Patient connected to nasal cannula oxygen  Post-op Assessment: Report given to RN, Post -op Vital signs reviewed and stable and Patient moving all extremities  Post vital signs: Reviewed and stable  Last Vitals:  Vitals:   06/19/17 0618  BP: (!) 165/94  Pulse: 82  Resp: 18  Temp: 36.6 C  SpO2: 99%    Last Pain:  Vitals:   06/19/17 0618  TempSrc: Oral      Patients Stated Pain Goal: 2 (06/19/17 0700)  Complications: No apparent anesthesia complications

## 2017-06-19 NOTE — Anesthesia Preprocedure Evaluation (Signed)
Anesthesia Evaluation  Patient identified by MRN, date of birth, ID band Patient awake    Reviewed: Allergy & Precautions, H&P , NPO status , Patient's Chart, lab work & pertinent test results  Airway Mallampati: II   Neck ROM: full    Dental   Pulmonary asthma , Current Smoker,    breath sounds clear to auscultation       Cardiovascular hypertension,  Rhythm:regular Rate:Normal     Neuro/Psych  Headaches,    GI/Hepatic GERD  ,  Endo/Other    Renal/GU      Musculoskeletal  (+) Arthritis ,   Abdominal   Peds  Hematology   Anesthesia Other Findings   Reproductive/Obstetrics                             Anesthesia Physical Anesthesia Plan  ASA: II  Anesthesia Plan: General   Post-op Pain Management:    Induction: Intravenous  PONV Risk Score and Plan: 1 and Ondansetron, Dexamethasone, Treatment may vary due to age or medical condition and Midazolam  Airway Management Planned: Nasal ETT  Additional Equipment:   Intra-op Plan:   Post-operative Plan: Extubation in OR  Informed Consent: I have reviewed the patients History and Physical, chart, labs and discussed the procedure including the risks, benefits and alternatives for the proposed anesthesia with the patient or authorized representative who has indicated his/her understanding and acceptance.     Plan Discussed with: CRNA, Anesthesiologist and Surgeon  Anesthesia Plan Comments:         Anesthesia Quick Evaluation

## 2017-06-19 NOTE — Op Note (Signed)
06/19/2017  9:47 AM  PATIENT:  [name]  51 y.o. male  PRE-OPERATIVE DIAGNOSIS:  NON RESTORABLE teeth # 4, 5, 13, 20, 21, 22, 23, 24, 25, 26, 27, 29, Right mandibular lingual torus  POST-OPERATIVE DIAGNOSIS:  SAME  PROCEDURE:  Procedure(s): MULTIPLE EXTRACTION  teeth # 4, 5, 13, 20, 21, 22, 23, 24, 25, 26, 27, 29, removal Right mandibular lingual torus,  ALVEOLOPLASTY RIGHT AND LEFT MANDIBLE  SURGEON:  Surgeon(s): Ocie Doyne, DDS  ANESTHESIA:   local and general  EBL:  minimal  DRAINS: none   SPECIMEN:  No Specimen  COUNTS:  YES  PLAN OF CARE: Discharge to home after PACU  PATIENT DISPOSITION:  PACU - hemodynamically stable.   PROCEDURE DETAILS: Dictation # [id number]  [name], DMD 06/19/2017 9:47 AM

## 2017-06-19 NOTE — H&P (Signed)
H&P documentation  -History and Physical Reviewed  -Patient has been re-examined  -No change in the plan of care  [name] M  

## 2017-06-19 NOTE — Anesthesia Postprocedure Evaluation (Signed)
Anesthesia Post Note  Patient: [name]  Procedure(s) Performed: Procedure(s) (LRB): MULTIPLE EXTRACTION WITH ALVEOLOPLASTY (N/A)     Patient location during evaluation: PACU Anesthesia Type: General Level of consciousness: awake and alert Pain management: pain level controlled Vital Signs Assessment: post-procedure vital signs reviewed and stable Respiratory status: spontaneous breathing, nonlabored ventilation, respiratory function stable and patient connected to nasal cannula oxygen Cardiovascular status: blood pressure returned to baseline and stable Postop Assessment: no apparent nausea or vomiting Anesthetic complications: no    Last Vitals:  Vitals:   06/19/17 1045 06/19/17 1057  BP:  (!) 153/99  Pulse:  100  Resp:    Temp: 36.5 C   SpO2:  96%    Last Pain:  Vitals:   06/19/17 1057  TempSrc:   PainSc: 0-No pain                 [name]

## 2017-06-20 ENCOUNTER — Encounter (HOSPITAL_COMMUNITY): Payer: Self-pay | Admitting: Oral Surgery

## 2017-06-22 NOTE — Op Note (Signed)
NAME:  [name], [name]                   ACCOUNT NO.:  MEDICAL RECORD NO.:  [id number]  LOCATION:                                 FACILITY:  PHYSICIAN:  Georgia Lopes, M.D.       DATE OF BIRTH:  DATE OF PROCEDURE:  06/19/2017 DATE OF DISCHARGE:                              OPERATIVE REPORT   PREOPERATIVE DIAGNOSES: 1. Nonrestorable teeth secondary to dental caries, numbers 4, 5, 13,     20, 21, 22, 23, 24, 25, 26, 27, 29. 2. Right mandibular lingual torus.  POSTOPERATIVE DIAGNOSES: 1. Nonrestorable teeth secondary to dental caries, numbers 4, 5, 13,     20, 21, 22, 23, 24, 25, 26, 27, 29. 2. Right mandibular lingual torus.  PROCEDURES: 1. Extraction of teeth numbers 4, 5, 13, 20, 21, 22, 23, 24, 25, 26,     27, 29. 2. Removal of right mandibular lingual torus. 3. Alveoplasty of right and left mandible.  SURGEON:  Georgia Lopes, M.D.  ANESTHESIA:  General nasal intubation, Achille Rich, MD, attending.  DESCRIPTION OF PROCEDURE:  The patient was taken to the operating room, placed on the table in supine position.  General anesthesia administered intravenously and a nasal endotracheal tube was placed and secured.  The eyes were protected and the patient was draped for surgery.  Time-out was performed.  The posterior pharynx was suctioned.  A throat pack was placed.  Lidocaine 2% with 1:100,000 epinephrine was infiltrated in an inferior alveolar block on the right and left sides and then buccal infiltration of the anterior mandible.  Then, local anesthetic was deposited adjacent to the maxillary teeth both buccally and palatally, total of 15 mL was utilized.  A bite block was placed on the right side of the mouth and a Sweetheart retractor was used to retract the tongue and a #15 blade was used to make an incision, beginning about 1 cm proximal to tooth #20, and carried forward across the midline both buccally and lingually in the gingival sulcus around the lower  teeth until tooth #27 was encountered, and then the 15 blade was used to make an incision around tooth #13 in the maxilla in the gingival sulcus.  The periosteum was reflected from around these teeth.  The teeth were elevated with a 301 elevator and removed from the mouth with a dental forceps.  Then, the gingiva was trimmed with a 15 blade to allow for primary closure and then the periosteum was reflected to expose the alveolar crest in the left mandible.  The egg-shaped burr and bone file were used to perform alveoplasty.  Then, the areas were irrigated and closed with 3-0 chromic.  The bite block and Sweetheart retractor repositioned to the other side of the mouth.  A #15-blade was used to make an incision around tooth #27, carried forward in the edentulous space to tooth #29, and then buccally and lingually around this tooth and then approximately 2 cm proximal along the alveolar crest.  The periosteum was reflected with a periosteal elevator.  Tooth #29 was removed using the Asch forceps.  When attempting to remove #27, the tooth fractured.  Additional bone  was then removed using the fissure burr and a 301 elevator and the tooth was eventually extracted.  Then, teeth numbers 4 and 5 were removed using a 15 blade, periosteal elevator, and 301 elevator, and upper forceps.  The sockets were then curetted.  Then, the periosteum was reflected in the mandible buccally and lingually to expose the alveolar crest and the mandibular torus. The Seldin retractor was used to retract the floor of the mouth tissues, and the torus was removed using the egg-shaped burr.  Then, alveoplasty was performed buccally and lingually on the right side using the egg- shaped burr.  The bone file was then used to smooth these areas.  Then, the sockets were irrigated.  The bone was irrigated and then closed with 3-0 chromic.  The oral cavity was then irrigated and suctioned.  The throat pack was removed.  The  patient was left in care of Anesthesia for extubation, awakening, and transportation to recovery room with plans for discharge through Day Surgery.  ESTIMATED BLOOD LOSS:  Minimal.  COMPLICATIONS:  None.  SPECIMENS:  None.     Georgia Lopes, M.D.     SMJ/MEDQ  D:  06/19/2017  T:  06/19/2017  Job:  [id number]

## 2017-06-27 ENCOUNTER — Encounter (HOSPITAL_COMMUNITY): Payer: Self-pay | Admitting: Emergency Medicine

## 2017-06-27 ENCOUNTER — Emergency Department (HOSPITAL_COMMUNITY)
Admission: EM | Admit: 2017-06-27 | Discharge: 2017-06-27 | Disposition: A | Discharge status: Home / Self Care | Payer: Medicaid Other | Attending: Emergency Medicine | Admitting: Emergency Medicine

## 2017-06-27 DIAGNOSIS — L0291 Cutaneous abscess, unspecified: Secondary | ICD-10-CM

## 2017-06-27 DIAGNOSIS — J45909 Unspecified asthma, uncomplicated: Secondary | ICD-10-CM | POA: Insufficient documentation

## 2017-06-27 DIAGNOSIS — Z79899 Other long term (current) drug therapy: Secondary | ICD-10-CM | POA: Insufficient documentation

## 2017-06-27 DIAGNOSIS — F17210 Nicotine dependence, cigarettes, uncomplicated: Secondary | ICD-10-CM | POA: Insufficient documentation

## 2017-06-27 DIAGNOSIS — L0211 Cutaneous abscess of neck: Secondary | ICD-10-CM | POA: Diagnosis present

## 2017-06-27 MED ORDER — SULFAMETHOXAZOLE-TRIMETHOPRIM 800-160 MG PO TABS
1.0000 | ORAL_TABLET | Freq: Two times a day (BID) | ORAL | Status: DC
  Administered 2017-06-27: 1 via ORAL
  Filled 2017-06-27: qty 1

## 2017-06-27 MED ORDER — SULFAMETHOXAZOLE-TRIMETHOPRIM 800-160 MG PO TABS: 1.0000 | ORAL_TABLET | Freq: Two times a day (BID) | ORAL | 0 refills | Status: AC

## 2017-06-27 NOTE — Discharge Instructions (Signed)
Warm wet compresses on/off to your neck.  Avoid squeezing it.  Return return in 2-3 days if not improving

## 2017-06-27 NOTE — ED Triage Notes (Signed)
Onset 2 days ago, Abscess to back of neck

## 2017-06-29 NOTE — ED Provider Notes (Signed)
[facility]EMERGENCY DEPT Provider Note   CSN: [id number] Arrival date & time: 06/27/17  1236     History   Chief Complaint Chief Complaint  Patient presents with  . Abscess    HPI [name] is a 51 y.o. male.  HPI  [name] is a 51 y.o. male who presents to the Emergency Department complaining of painful , swollen area to the back of his neck.  Onset 2 days ago.  Reports hx of recurrent abscesses.  Describes slight drainage one day prior.  Pain associated with palpation and movement of the neck.  Denies fever, chills headaches,and  Dizziness   Past Medical History:  Diagnosis Date  . Acid reflux disease   . Arthritis   . Asthma   . Gout   . Headache   . High blood pressure   . High cholesterol   . Psoriasis     Patient Active Problem List   Diagnosis Date Noted  . ASEPTIC NECROSIS 09/16/2007  . HIGH BLOOD PRESSURE 08/17/2007    Past Surgical History:  Procedure Laterality Date  . JOINT REPLACEMENT     two total hip replacements  . MULTIPLE EXTRACTIONS WITH ALVEOLOPLASTY N/A 06/19/2017   Procedure: MULTIPLE EXTRACTION WITH ALVEOLOPLASTY;  Surgeon: Ocie Doyne, DDS;  Location: [facility] OR;  Service: Oral Surgery;  Laterality: N/A;  . SPINAL FUSION    . TOTAL HIP ARTHROPLASTY         Home Medications    Prior to Admission medications   Medication Sig Start Date End Date Taking? Authorizing Provider  albuterol (PROVENTIL HFA;VENTOLIN HFA) 108 (90 BASE) MCG/ACT inhaler Inhale 2 puffs into the lungs 4 (four) times daily as needed for wheezing or shortness of breath.     [provider]  atorvastatin (LIPITOR) 40 MG tablet Take 40 mg by mouth daily.    [provider]  cyclobenzaprine (FLEXERIL) 10 MG tablet Take 1 tablet (10 mg total) by mouth 3 (three) times daily as needed for muscle spasms. 06/19/17   Ocie Doyne, DDS  hydrOXYzine (ATARAX/VISTARIL) 25 MG tablet Take 1 tablet (25 mg total) by mouth every 6 (six) hours as needed for  itching. 03/15/15   Shaundrea Carrigg, PA-C  ibuprofen (ADVIL,MOTRIN) 800 MG tablet Take 1 tablet (800 mg total) by mouth every 8 (eight) hours as needed. 06/19/17   Ocie Doyne, DDS  levothyroxine (SYNTHROID, LEVOTHROID) 50 MCG tablet Take 50 mcg by mouth daily before breakfast.    [provider]  lisinopril (PRINIVIL,ZESTRIL) 20 MG tablet Take 20 mg by mouth daily.    [provider]  naproxen (NAPROSYN) 500 MG tablet Take 500 mg by mouth daily.     [provider]  omeprazole (PRILOSEC) 20 MG capsule Take 20 mg by mouth daily as needed (for acid reflux).     [provider]  oxyCODONE (OXY IR/ROXICODONE) 5 MG immediate release tablet Take 5 mg by mouth every 6 (six) hours as needed for moderate pain.    [provider]  permethrin (ELIMITE) 5 % cream Apply from neck down, leave on 14 hrs then wash off.  May re-apply in one week if needed Patient not taking: Reported on 11/25/2015 03/15/15   Pauline Aus, PA-C  Skin Protectants, Misc. (EUCERIN) cream Apply 1 application topically as needed for dry skin.    [provider]  sulfamethoxazole-trimethoprim (BACTRIM DS,SEPTRA DS) 800-160 MG tablet Take 1 tablet by mouth 2 (two) times daily. 06/27/17 07/04/17  Quaneshia Wareing, PA-C  tamsulosin (  FLOMAX) 0.4 MG CAPS capsule Take 1 capsule (0.4 mg total) by mouth daily. Patient not taking: Reported on 06/19/2017 11/25/15   Donnetta Hutching, MD    Family History Family History  Problem Relation Age of Onset  . Diabetes Other     Social History Social History  Substance Use Topics  . Smoking status: Current Every Day Smoker    Packs/day: 0.50    Years: 20.00    Types: Cigarettes  . Smokeless tobacco: Never Used     Comment: patient states he smokes daily but only socially   . Alcohol use Yes     Comment: occasionally     Allergies   Patient has no known allergies.   Review of Systems Review of Systems  Constitutional: Negative for chills  and fever.  Gastrointestinal: Negative for nausea and vomiting.  Musculoskeletal: Negative for arthralgias and joint swelling.  Skin: Positive for color change.       Abscess   Hematological: Negative for adenopathy.  All other systems reviewed and are negative.    Physical Exam Updated Vital Signs BP (!) 155/103 (BP Location: Right Arm)   Pulse (!) 103   Temp 98.7 F (37.1 C) (Oral)   Resp 19   Ht  (1.702 m)   Wt 113.4 kg (250 lb)   SpO2 97%   BMI 39.16 kg/m   Physical Exam  Constitutional: He is oriented to person, place, and time. He appears well-developed and well-nourished. No distress.  HENT:  Head: Normocephalic and atraumatic.  Cardiovascular: Normal rate, regular rhythm and normal heart sounds.   No murmur heard. Pulmonary/Chest: Effort normal and breath sounds normal. No respiratory distress.  Musculoskeletal: Normal range of motion.  Neurological: He is alert and oriented to person, place, and time. He exhibits normal muscle tone. Coordination normal.  Skin: Skin is warm and dry. Capillary refill takes less than 2 seconds. There is erythema.  Focal 3 cm area of induration and minimal erythema of the skin to the posterior neck.  No drainage or fluctuance.    Nursing note and vitals reviewed.    ED Treatments / Results  Labs (all labs ordered are listed, but only abnormal results are displayed) Labs Reviewed - No data to display  EKG  EKG Interpretation None       Radiology No results found.  Procedures Procedures (including critical care time)  Medications Ordered in ED Medications - No data to display   Initial Impression / Assessment and Plan / ED Course  I have reviewed the triage vital signs and the nursing notes.  Pertinent labs & imaging results that were available during my care of the patient were reviewed by me and considered in my medical decision making (see chart for details).    Likely early developing abscess to posterior  neck.  No significant surrounding erythema.  No indication for I&D at this time.  Pt non-toxic appearing.  Hx of same.  Agrees to tx plan with warm compresses, abx, and return in 2-3 days if not improving.   Final Clinical Impressions(s) / ED Diagnoses   Final diagnoses:  Abscess    New Prescriptions Discharge Medication List as of 06/27/2017  1:23 PM    START taking these medications   Details  sulfamethoxazole-trimethoprim (BACTRIM DS,SEPTRA DS) 800-160 MG tablet Take 1 tablet by mouth 2 (two) times daily., Starting Sat 06/27/2017, Until Sat 07/04/2017, Print         [place], [place], [place] 06/29/17 2044  Pricilla Loveless, MD 07/01/17 [phone]

## 2017-10-29 ENCOUNTER — Encounter (HOSPITAL_COMMUNITY): Payer: Self-pay | Admitting: Emergency Medicine

## 2017-10-29 ENCOUNTER — Emergency Department (HOSPITAL_COMMUNITY)
Admission: EM | Admit: 2017-10-29 | Discharge: 2017-10-29 | Disposition: A | Discharge status: Home / Self Care | Payer: Medicaid Other | Attending: Emergency Medicine | Admitting: Emergency Medicine

## 2017-10-29 ENCOUNTER — Other Ambulatory Visit: Payer: Self-pay

## 2017-10-29 DIAGNOSIS — F17210 Nicotine dependence, cigarettes, uncomplicated: Secondary | ICD-10-CM | POA: Diagnosis not present

## 2017-10-29 DIAGNOSIS — L02416 Cutaneous abscess of left lower limb: Secondary | ICD-10-CM | POA: Diagnosis not present

## 2017-10-29 DIAGNOSIS — J45909 Unspecified asthma, uncomplicated: Secondary | ICD-10-CM | POA: Insufficient documentation

## 2017-10-29 DIAGNOSIS — L0291 Cutaneous abscess, unspecified: Secondary | ICD-10-CM

## 2017-10-29 DIAGNOSIS — Z79899 Other long term (current) drug therapy: Secondary | ICD-10-CM | POA: Diagnosis not present

## 2017-10-29 DIAGNOSIS — R2242 Localized swelling, mass and lump, left lower limb: Secondary | ICD-10-CM | POA: Diagnosis present

## 2017-10-29 MED ORDER — DOXYCYCLINE HYCLATE 100 MG PO TABS
100.0000 mg | ORAL_TABLET | Freq: Once | ORAL | Status: AC
  Administered 2017-10-29: 100 mg via ORAL
  Filled 2017-10-29: qty 1

## 2017-10-29 MED ORDER — IBUPROFEN 400 MG PO TABS: 400.0000 mg | ORAL_TABLET | Freq: Four times a day (QID) | ORAL | 0 refills | Status: DC

## 2017-10-29 MED ORDER — DOXYCYCLINE HYCLATE 100 MG PO TABS: 100.0000 mg | ORAL_TABLET | Freq: Two times a day (BID) | ORAL | 0 refills | Status: DC

## 2017-10-29 MED ORDER — LIDOCAINE HCL (PF) 1 % IJ SOLN
INTRAMUSCULAR | Status: AC
  Administered 2017-10-29: 5 mL
  Filled 2017-10-29: qty 5

## 2017-10-29 MED ORDER — TRAMADOL HCL 50 MG PO TABS: 50.0000 mg | ORAL_TABLET | Freq: Four times a day (QID) | ORAL | 0 refills | Status: DC | PRN

## 2017-10-29 MED ORDER — CEFTRIAXONE SODIUM 1 G IJ SOLR
1.0000 g | Freq: Once | INTRAMUSCULAR | Status: AC
  Administered 2017-10-29: 1 g via INTRAMUSCULAR
  Filled 2017-10-29: qty 10

## 2017-10-29 MED ORDER — ONDANSETRON HCL 4 MG PO TABS
4.0000 mg | ORAL_TABLET | Freq: Once | ORAL | Status: AC
  Administered 2017-10-29: 4 mg via ORAL
  Filled 2017-10-29: qty 1

## 2017-10-29 NOTE — ED Triage Notes (Signed)
Patient reports 2 areas of abscess on his L lower leg, onset yesterday.

## 2017-10-29 NOTE — ED Notes (Signed)
EDP at bedside  

## 2017-10-29 NOTE — Discharge Instructions (Signed)
Please soak the abscess area and warm Epson salt water for about 10-15 minutes daily.  Please keep the area wrapped to protect from additional injury.  Please elevate your leg when possible.  Use doxycycline 2 times daily.  Use ibuprofen with breakfast, lunch, dinner, and at bedtime.  Use Ultram for pain if needed. This medication may cause drowsiness. Please do not drink, drive, or participate in activity that requires concentration while taking this medication.

## 2017-10-29 NOTE — ED Notes (Signed)
Telfa and kling dressing applied to LLE wounds.

## 2017-10-29 NOTE — ED Provider Notes (Signed)
[facility] EMERGENCY DEPARTMENT Provider Note   CSN: [id number] Arrival date & time: 10/29/17  1824     History   Chief Complaint Chief Complaint  Patient presents with  . Abscess    HPI [name] is a 52 y.o. male.  Patient is a 52 year old male who presents to the emergency department with abscess of the lower leg.  The patient states that on yesterday he noticed red areas of his left lower leg.  Today he noticed the red areas along with a small pus pocket.  The redness continued to enlarge during the day.  The patient states he has had increasing tenderness of the area.  He denies high fever or chills.  There is been no nausea no vomiting.  The patient does not recall anything biting him or anything that may have attacked the soft tissue of his left lower leg.  It is of note that he has had previous problems with abscess areas.  He presents now for assistance with this issue.      Past Medical History:  Diagnosis Date  . Acid reflux disease   . Arthritis   . Asthma   . Gout   . Headache   . High blood pressure   . High cholesterol   . Psoriasis     Patient Active Problem List   Diagnosis Date Noted  . ASEPTIC NECROSIS 09/16/2007  . HIGH BLOOD PRESSURE 08/17/2007    Past Surgical History:  Procedure Laterality Date  . JOINT REPLACEMENT     two total hip replacements  . MULTIPLE EXTRACTIONS WITH ALVEOLOPLASTY N/A 06/19/2017   Procedure: MULTIPLE EXTRACTION WITH ALVEOLOPLASTY;  Surgeon: Ocie Doyne, DDS;  Location: [facility] OR;  Service: Oral Surgery;  Laterality: N/A;  . SPINAL FUSION    . TOTAL HIP ARTHROPLASTY         Home Medications    Prior to Admission medications   Medication Sig Start Date End Date Taking? Authorizing Provider  albuterol (PROVENTIL HFA;VENTOLIN HFA) 108 (90 BASE) MCG/ACT inhaler Inhale 2 puffs into the lungs 4 (four) times daily as needed for wheezing or shortness of breath.     [provider]  atorvastatin  (LIPITOR) 40 MG tablet Take 40 mg by mouth daily.    [provider]  cyclobenzaprine (FLEXERIL) 10 MG tablet Take 1 tablet (10 mg total) by mouth 3 (three) times daily as needed for muscle spasms. 06/19/17   Ocie Doyne, DDS  hydrOXYzine (ATARAX/VISTARIL) 25 MG tablet Take 1 tablet (25 mg total) by mouth every 6 (six) hours as needed for itching. 03/15/15   Triplett, Tammy, PA-C  ibuprofen (ADVIL,MOTRIN) 800 MG tablet Take 1 tablet (800 mg total) by mouth every 8 (eight) hours as needed. 06/19/17   Ocie Doyne, DDS  levothyroxine (SYNTHROID, LEVOTHROID) 50 MCG tablet Take 50 mcg by mouth daily before breakfast.    [provider]  lisinopril (PRINIVIL,ZESTRIL) 20 MG tablet Take 20 mg by mouth daily.    [provider]  naproxen (NAPROSYN) 500 MG tablet Take 500 mg by mouth daily.     [provider]  omeprazole (PRILOSEC) 20 MG capsule Take 20 mg by mouth daily as needed (for acid reflux).     [provider]  oxyCODONE (OXY IR/ROXICODONE) 5 MG immediate release tablet Take 5 mg by mouth every 6 (six) hours as needed for moderate pain.    [provider]  permethrin (ELIMITE) 5 % cream Apply from neck down, leave on 14  hrs then wash off.  May re-apply in one week if needed Patient not taking: Reported on 11/25/2015 03/15/15   Pauline Aus, PA-C  Skin Protectants, Misc. (EUCERIN) cream Apply 1 application topically as needed for dry skin.    [provider]  tamsulosin (FLOMAX) 0.4 MG CAPS capsule Take 1 capsule (0.4 mg total) by mouth daily. Patient not taking: Reported on 06/19/2017 11/25/15   Donnetta Hutching, MD    Family History Family History  Problem Relation Age of Onset  . Diabetes Other     Social History Social History   Tobacco Use  . Smoking status: Current Every Day Smoker    Packs/day: 0.50    Years: 20.00    Pack years: 10.00    Types: Cigarettes  . Smokeless tobacco: Never Used  . Tobacco comment: patient  states he smokes daily but only socially   Substance Use Topics  . Alcohol use: Yes    Comment: occasionally  . Drug use: No     Allergies   Patient has no known allergies.   Review of Systems Review of Systems  Constitutional: Negative for activity change.       All ROS Neg except as noted in HPI  HENT: Negative for nosebleeds.   Eyes: Negative for photophobia and discharge.  Respiratory: Negative for cough, shortness of breath and wheezing.   Cardiovascular: Negative for chest pain and palpitations.  Gastrointestinal: Negative for abdominal pain and blood in stool.  Genitourinary: Negative for dysuria, frequency and hematuria.  Musculoskeletal: Negative for arthralgias, back pain and neck pain.  Skin: Negative.  Negative for rash.       Abscess  Neurological: Negative for dizziness, seizures and speech difficulty.  Psychiatric/Behavioral: Negative for confusion and hallucinations.     Physical Exam Updated Vital Signs BP (!) 169/144 Comment: reports taking all BP meds today  Pulse (!) 121   Temp 98.8 F (37.1 C) (Oral)   Resp 18   Ht 5\' 7"  (1.702 m)   Wt 113.4 kg (250 lb)   SpO2 100%   BMI 39.16 kg/m   Physical Exam  Constitutional: He is oriented to person, place, and time. He appears well-developed and well-nourished.  Non-toxic appearance.  HENT:  Head: Normocephalic.  Right Ear: Tympanic membrane and external ear normal.  Left Ear: Tympanic membrane and external ear normal.  Eyes: EOM and lids are normal. Pupils are equal, round, and reactive to light.  Neck: Normal range of motion. Neck supple. Carotid bruit is not present.  Cardiovascular: Normal rate, regular rhythm, normal heart sounds, intact distal pulses and normal pulses.  Pulmonary/Chest: Breath sounds normal. No respiratory distress.  Abdominal: Soft. Bowel sounds are normal. There is no tenderness. There is no guarding.  Musculoskeletal: Normal range of motion.  There are 2 red papular areas on  the tibial surface of the left lower extremity.  Both of them have small pustules in the center with increased redness around the area.  They are warm but not hot.  There are no red streaks going up the leg.  The dorsalis pedis pulses 2+ bilaterally.  Lymphadenopathy:       Head (right side): No submandibular adenopathy present.       Head (left side): No submandibular adenopathy present.    He has no cervical adenopathy.  Neurological: He is alert and oriented to person, place, and time. He has normal strength. No cranial nerve deficit or sensory deficit.  Skin: Skin is warm and dry.  Psychiatric:  He has a normal mood and affect. His speech is normal.  Nursing note and vitals reviewed.    ED Treatments / Results  Labs (all labs ordered are listed, but only abnormal results are displayed) Labs Reviewed - No data to display  EKG  EKG Interpretation None       Radiology No results found.  Procedures Procedures (including critical care time)  Medications Ordered in ED Medications  cefTRIAXone (ROCEPHIN) injection 1 g (1 g Intramuscular Given 10/29/17 2102)  doxycycline (VIBRA-TABS) tablet 100 mg (100 mg Oral Given 10/29/17 2102)  ondansetron (ZOFRAN) tablet 4 mg (4 mg Oral Given 10/29/17 2102)  lidocaine (PF) (XYLOCAINE) 1 % injection (5 mLs  Given 10/29/17 2102)     Initial Impression / Assessment and Plan / ED Course  I have reviewed the triage vital signs and the nursing notes.  Pertinent labs & imaging results that were available during my care of the patient were reviewed by me and considered in my medical decision making (see chart for details).       Final Clinical Impressions(s) / ED Diagnoses MDM Heart rate and blood pressure was elevated initially upon admission to the emergency department.  The examination showed possible abscess areas of the lower extremity.  There is no evidence of red streaking or signs of cellulitis.  The patient is unsure of what may have  bit him or how that he may have developed this issue, but he has had problems with abscess in the past.  There is no drainage noted.  The patient will be treated with doxycycline.  He will use Norco for pain, and he will have this followed by his primary physician in the office.  He will return to the emergency department if any changes, problems, or concerns.   Final diagnoses:  Abscess    ED Discharge Orders        Ordered    traMADol (ULTRAM) 50 MG tablet  Every 6 hours PRN     10/29/17 2124    doxycycline (VIBRA-TABS) 100 MG tablet  2 times daily     10/29/17 2124    ibuprofen (ADVIL,MOTRIN) 400 MG tablet  4 times daily     10/29/17 2124       Ivery QualeBryant, [place], PA-C 10/30/17 1823    Samuel JesterMcManus, Kathleen, DO 10/31/17 1134

## 2018-03-16 ENCOUNTER — Inpatient Hospital Stay (HOSPITAL_COMMUNITY)
Admission: EM | Admit: 2018-03-16 | Discharge: 2018-03-19 | DRG: 872 | Disposition: A | Discharge status: Skilled Nursing Facility | Payer: Medicaid Other | Attending: Family Medicine | Admitting: Family Medicine

## 2018-03-16 ENCOUNTER — Emergency Department (HOSPITAL_COMMUNITY): Payer: Medicaid Other

## 2018-03-16 ENCOUNTER — Encounter (HOSPITAL_COMMUNITY): Payer: Self-pay | Admitting: Emergency Medicine

## 2018-03-16 ENCOUNTER — Other Ambulatory Visit: Payer: Self-pay

## 2018-03-16 DIAGNOSIS — E785 Hyperlipidemia, unspecified: Secondary | ICD-10-CM | POA: Diagnosis present

## 2018-03-16 DIAGNOSIS — K219 Gastro-esophageal reflux disease without esophagitis: Secondary | ICD-10-CM | POA: Diagnosis present

## 2018-03-16 DIAGNOSIS — Z791 Long term (current) use of non-steroidal anti-inflammatories (NSAID): Secondary | ICD-10-CM

## 2018-03-16 DIAGNOSIS — L03116 Cellulitis of left lower limb: Secondary | ICD-10-CM | POA: Diagnosis present

## 2018-03-16 DIAGNOSIS — F17210 Nicotine dependence, cigarettes, uncomplicated: Secondary | ICD-10-CM | POA: Diagnosis present

## 2018-03-16 DIAGNOSIS — E872 Acidosis: Secondary | ICD-10-CM | POA: Diagnosis present

## 2018-03-16 DIAGNOSIS — Z96649 Presence of unspecified artificial hip joint: Secondary | ICD-10-CM | POA: Diagnosis present

## 2018-03-16 DIAGNOSIS — E876 Hypokalemia: Secondary | ICD-10-CM | POA: Diagnosis present

## 2018-03-16 DIAGNOSIS — E66813 Obesity, class 3: Secondary | ICD-10-CM | POA: Diagnosis present

## 2018-03-16 DIAGNOSIS — E039 Hypothyroidism, unspecified: Secondary | ICD-10-CM | POA: Diagnosis present

## 2018-03-16 DIAGNOSIS — J45909 Unspecified asthma, uncomplicated: Secondary | ICD-10-CM | POA: Diagnosis present

## 2018-03-16 DIAGNOSIS — I10 Essential (primary) hypertension: Secondary | ICD-10-CM | POA: Diagnosis present

## 2018-03-16 DIAGNOSIS — L409 Psoriasis, unspecified: Secondary | ICD-10-CM | POA: Diagnosis present

## 2018-03-16 DIAGNOSIS — L0292 Furuncle, unspecified: Secondary | ICD-10-CM | POA: Diagnosis present

## 2018-03-16 DIAGNOSIS — Z79899 Other long term (current) drug therapy: Secondary | ICD-10-CM | POA: Diagnosis not present

## 2018-03-16 DIAGNOSIS — A419 Sepsis, unspecified organism: Secondary | ICD-10-CM | POA: Diagnosis present

## 2018-03-16 DIAGNOSIS — Z6841 Body Mass Index (BMI) 40.0 and over, adult: Secondary | ICD-10-CM

## 2018-03-16 DIAGNOSIS — L03115 Cellulitis of right lower limb: Secondary | ICD-10-CM | POA: Diagnosis present

## 2018-03-16 DIAGNOSIS — L03119 Cellulitis of unspecified part of limb: Secondary | ICD-10-CM | POA: Diagnosis present

## 2018-03-16 DIAGNOSIS — Z981 Arthrodesis status: Secondary | ICD-10-CM | POA: Diagnosis not present

## 2018-03-16 LAB — CBC WITH DIFFERENTIAL/PLATELET
BASOS PCT: 1 %
Basophils Absolute: 0.1 10*3/uL (ref 0.0–0.1)
EOS ABS: 0.3 10*3/uL (ref 0.0–0.7)
EOS PCT: 2 %
HCT: 42.2 % (ref 39.0–52.0)
Hemoglobin: 14.2 g/dL (ref 13.0–17.0)
LYMPHS ABS: 2.5 10*3/uL (ref 0.7–4.0)
Lymphocytes Relative: 15 %
MCH: 31.5 pg (ref 26.0–34.0)
MCHC: 33.6 g/dL (ref 30.0–36.0)
MCV: 93.6 fL (ref 78.0–100.0)
MONO ABS: 1.7 10*3/uL — AB (ref 0.1–1.0)
MONOS PCT: 10 %
Neutro Abs: 11.9 10*3/uL — ABNORMAL HIGH (ref 1.7–7.7)
Neutrophils Relative %: 72 %
Platelets: 278 10*3/uL (ref 150–400)
RBC: 4.51 MIL/uL (ref 4.22–5.81)
RDW: 13.5 % (ref 11.5–15.5)
WBC: 16.4 10*3/uL — ABNORMAL HIGH (ref 4.0–10.5)

## 2018-03-16 LAB — COMPREHENSIVE METABOLIC PANEL
ALT: 28 U/L (ref 17–63)
AST: 24 U/L (ref 15–41)
Albumin: 3.7 g/dL (ref 3.5–5.0)
Alkaline Phosphatase: 146 U/L — ABNORMAL HIGH (ref 38–126)
Anion gap: 11 (ref 5–15)
BUN: 8 mg/dL (ref 6–20)
CALCIUM: 8.9 mg/dL (ref 8.9–10.3)
CHLORIDE: 97 mmol/L — AB (ref 101–111)
CO2: 26 mmol/L (ref 22–32)
CREATININE: 0.73 mg/dL (ref 0.61–1.24)
GFR calc non Af Amer: >60 mL/min (ref >60)
Glucose, Bld: 174 mg/dL — ABNORMAL HIGH (ref 65–99)
Potassium: 3.3 mmol/L — ABNORMAL LOW (ref 3.5–5.1)
SODIUM: 134 mmol/L — AB (ref 135–145)
Total Bilirubin: 0.6 mg/dL (ref 0.3–1.2)
Total Protein: 7.9 g/dL (ref 6.5–8.1)

## 2018-03-16 LAB — URINALYSIS, ROUTINE W REFLEX MICROSCOPIC
BILIRUBIN URINE: NEGATIVE
Glucose, UA: NEGATIVE mg/dL
HGB URINE DIPSTICK: NEGATIVE
KETONES UR: NEGATIVE mg/dL
Leukocytes, UA: NEGATIVE
NITRITE: NEGATIVE
Protein, ur: NEGATIVE mg/dL
SPECIFIC GRAVITY, URINE: 1.003 — AB (ref 1.005–1.030)
pH: 7 (ref 5.0–8.0)

## 2018-03-16 LAB — MAGNESIUM: MAGNESIUM: 1.9 mg/dL (ref 1.7–2.4)

## 2018-03-16 LAB — TSH: TSH: 2.185 u[IU]/mL (ref 0.350–4.500)

## 2018-03-16 LAB — LACTIC ACID, PLASMA
LACTIC ACID, VENOUS: 2.5 mmol/L — AB (ref 0.5–1.9)
LACTIC ACID, VENOUS: 1.5 mmol/L (ref 0.5–1.9)

## 2018-03-16 MED ORDER — HYDROXYZINE HCL 25 MG PO TABS: 25.0000 mg | ORAL_TABLET | Freq: Four times a day (QID) | ORAL | Status: DC | PRN

## 2018-03-16 MED ORDER — PIPERACILLIN-TAZOBACTAM 3.375 G IVPB 30 MIN
3.3750 g | Freq: Once | INTRAVENOUS | Status: AC
  Administered 2018-03-16: 3.375 g via INTRAVENOUS
  Filled 2018-03-16: qty 50

## 2018-03-16 MED ORDER — ATORVASTATIN CALCIUM 40 MG PO TABS
40.0000 mg | ORAL_TABLET | Freq: Every day | ORAL | Status: DC
  Administered 2018-03-17 – 2018-03-19 (×3): 40 mg via ORAL
  Filled 2018-03-16 (×3): qty 1

## 2018-03-16 MED ORDER — OXYCODONE HCL 5 MG PO TABS
5.0000 mg | ORAL_TABLET | Freq: Four times a day (QID) | ORAL | Status: DC | PRN
  Administered 2018-03-16 – 2018-03-19 (×11): 5 mg via ORAL
  Filled 2018-03-16 (×11): qty 1

## 2018-03-16 MED ORDER — ALBUTEROL SULFATE (2.5 MG/3ML) 0.083% IN NEBU: 3.0000 mL | INHALATION_SOLUTION | Freq: Four times a day (QID) | RESPIRATORY_TRACT | Status: DC | PRN

## 2018-03-16 MED ORDER — POTASSIUM CHLORIDE CRYS ER 20 MEQ PO TBCR
40.0000 meq | EXTENDED_RELEASE_TABLET | Freq: Once | ORAL | Status: AC
  Administered 2018-03-16: 40 meq via ORAL
  Filled 2018-03-16: qty 2

## 2018-03-16 MED ORDER — ONDANSETRON HCL 4 MG/2ML IJ SOLN: 4.0000 mg | Freq: Four times a day (QID) | INTRAMUSCULAR | Status: DC | PRN

## 2018-03-16 MED ORDER — VANCOMYCIN HCL 10 G IV SOLR
1500.0000 mg | Freq: Once | INTRAVENOUS | Status: AC
  Administered 2018-03-16: 1500 mg via INTRAVENOUS
  Filled 2018-03-16: qty 1500

## 2018-03-16 MED ORDER — ACETAMINOPHEN 650 MG RE SUPP: 650.0000 mg | Freq: Four times a day (QID) | RECTAL | Status: DC | PRN

## 2018-03-16 MED ORDER — ACETAMINOPHEN 325 MG PO TABS
650.0000 mg | ORAL_TABLET | Freq: Once | ORAL | Status: AC
  Administered 2018-03-16: 650 mg via ORAL
  Filled 2018-03-16: qty 2

## 2018-03-16 MED ORDER — LISINOPRIL 10 MG PO TABS
20.0000 mg | ORAL_TABLET | Freq: Every day | ORAL | Status: DC
  Administered 2018-03-17 – 2018-03-19 (×3): 20 mg via ORAL
  Filled 2018-03-16 (×3): qty 2

## 2018-03-16 MED ORDER — SODIUM CHLORIDE 0.9 % IV SOLN
INTRAVENOUS | Status: DC
  Administered 2018-03-16 – 2018-03-19 (×7): via INTRAVENOUS

## 2018-03-16 MED ORDER — VITAMIN D 1000 UNITS PO TABS
5000.0000 [IU] | ORAL_TABLET | Freq: Every day | ORAL | Status: DC
  Administered 2018-03-17 – 2018-03-19 (×3): 5000 [IU] via ORAL
  Filled 2018-03-16 (×3): qty 5

## 2018-03-16 MED ORDER — LEVOTHYROXINE SODIUM 50 MCG PO TABS
50.0000 ug | ORAL_TABLET | Freq: Every day | ORAL | Status: DC
  Administered 2018-03-17 – 2018-03-19 (×3): 50 ug via ORAL
  Filled 2018-03-16 (×3): qty 1

## 2018-03-16 MED ORDER — ONDANSETRON HCL 4 MG PO TABS: 4.0000 mg | ORAL_TABLET | Freq: Four times a day (QID) | ORAL | Status: DC | PRN

## 2018-03-16 MED ORDER — SODIUM CHLORIDE 0.9 % IV BOLUS
1000.0000 mL | Freq: Once | INTRAVENOUS | Status: AC
  Administered 2018-03-16: 1000 mL via INTRAVENOUS

## 2018-03-16 MED ORDER — HEPARIN SODIUM (PORCINE) 5000 UNIT/ML IJ SOLN
5000.0000 [IU] | Freq: Three times a day (TID) | INTRAMUSCULAR | Status: DC
  Administered 2018-03-16 – 2018-03-19 (×9): 5000 [IU] via SUBCUTANEOUS
  Filled 2018-03-16 (×9): qty 1

## 2018-03-16 MED ORDER — PIPERACILLIN-TAZOBACTAM 3.375 G IVPB
3.3750 g | Freq: Three times a day (TID) | INTRAVENOUS | Status: DC
  Administered 2018-03-16 – 2018-03-19 (×9): 3.375 g via INTRAVENOUS
  Filled 2018-03-16 (×9): qty 50

## 2018-03-16 MED ORDER — PANTOPRAZOLE SODIUM 40 MG PO TBEC
40.0000 mg | DELAYED_RELEASE_TABLET | Freq: Every day | ORAL | Status: DC
  Administered 2018-03-17 – 2018-03-19 (×3): 40 mg via ORAL
  Filled 2018-03-16 (×3): qty 1

## 2018-03-16 MED ORDER — VANCOMYCIN HCL IN DEXTROSE 1-5 GM/200ML-% IV SOLN: 1000.0000 mg | Freq: Once | INTRAVENOUS | Status: DC

## 2018-03-16 MED ORDER — ACETAMINOPHEN 325 MG PO TABS: 650.0000 mg | ORAL_TABLET | Freq: Four times a day (QID) | ORAL | Status: DC | PRN

## 2018-03-16 MED ORDER — VANCOMYCIN HCL IN DEXTROSE 1-5 GM/200ML-% IV SOLN
1000.0000 mg | Freq: Two times a day (BID) | INTRAVENOUS | Status: DC
  Administered 2018-03-16 – 2018-03-19 (×6): 1000 mg via INTRAVENOUS
  Filled 2018-03-16 (×6): qty 200

## 2018-03-16 NOTE — ED Notes (Signed)
Date and time results received: 03/16/18  (use smartphrase ".now" to insert current time)  Test: Lactic Acid  Critical Value: 2.5  Name of Provider Notified: Criss AlvineGoldston MD  Orders Received? Or Actions Taken?: n/a

## 2018-03-16 NOTE — Plan of Care (Signed)
progressing 

## 2018-03-16 NOTE — H&P (Signed)
History and Physical    [name] ION:[id number] DOB: 28-May-1966 DOA: 03/16/2018  PCP: Lucia Gaskins, MD   I have briefly reviewed patients previous medical reports in [facility].  Patient coming from: home  Chief Complaint: Bilateral lower extremity swelling, redness and purulent boils.  HPI: [name] is a 52 y/o man with past medical history significant psoriasis, morbid obesity, hypertension, hyperlipidemia, GERD and history of asthma; who presented to the ED with over 48 hours of lower extremity swelling, redness and purulent boils formation.  Patient reports initially just having the swelling and redness with a subsequent development of these boils/blisters with a purulent drainage.  He reports chills but never to his temperature at home.  Patient denies any insect bite, injury or trauma.  No chest pain, no shortness of breath, no abdominal pain, no vomiting, no nausea, no melena, no hematochezia, no diarrhea, no other acute complaints.  He has not taken anything at home to treat this condition.  ED Course: Found with low-grade temperature, elevated WBCs, tachycardic and elevated lactic acid.  Sepsis protocol initiated in the ED, blood cultures taken and broad-spectrum antibiotics given.  TRH consulted to admit patient for further evaluation and management of sepsis due to bilateral lower extremity cellulitis.  Review of Systems:  All other systems reviewed and apart from HPI, are negative.  Past Medical History:  Diagnosis Date  . Acid reflux disease   . Arthritis   . Asthma   . Gout   . Headache   . High blood pressure   . High cholesterol   . Psoriasis     Past Surgical History:  Procedure Laterality Date  . JOINT REPLACEMENT     two total hip replacements  . MULTIPLE EXTRACTIONS WITH ALVEOLOPLASTY N/A 06/19/2017   Procedure: MULTIPLE EXTRACTION WITH ALVEOLOPLASTY;  Surgeon: Diona Browner, DDS;  Location: [facility];  Service: Oral Surgery;  Laterality:  N/A;  . SPINAL FUSION    . TOTAL HIP ARTHROPLASTY      Social History  reports that he has been smoking cigarettes.  He has a 10.00 pack-year smoking history. He has never used smokeless tobacco. He reports that he drinks alcohol. He reports that he does not use drugs.  No Known Allergies  Family History  Problem Relation Age of Onset  . Diabetes Other      Prior to Admission medications   Medication Sig Start Date End Date Taking? Authorizing Provider  albuterol (PROVENTIL HFA;VENTOLIN HFA) 108 (90 BASE) MCG/ACT inhaler Inhale 2 puffs into the lungs 4 (four) times daily as needed for wheezing or shortness of breath.    Yes [provider]  atorvastatin (LIPITOR) 40 MG tablet Take 40 mg by mouth daily.   Yes [provider]  cholecalciferol (VITAMIN D) 1000 units tablet Take 5,000 Units by mouth daily.   Yes [provider]  hydrOXYzine (ATARAX/VISTARIL) 25 MG tablet Take 1 tablet (25 mg total) by mouth every 6 (six) hours as needed for itching. 03/15/15  Yes Triplett, Tammy, PA-C  levothyroxine (SYNTHROID, LEVOTHROID) 50 MCG tablet Take 50 mcg by mouth daily before breakfast.   Yes [provider]  lisinopril (PRINIVIL,ZESTRIL) 20 MG tablet Take 20 mg by mouth daily.   Yes [provider]  naproxen (NAPROSYN) 500 MG tablet Take 500 mg by mouth daily.    Yes [provider]  omeprazole (PRILOSEC) 20 MG capsule Take 20 mg by mouth daily as needed (for acid reflux).    Yes  [provider]  oxyCODONE (OXY IR/ROXICODONE) 5 MG immediate release tablet Take 5 mg by mouth every 6 (six) hours as needed for moderate pain.   Yes [provider]  Skin Protectants, Misc. (EUCERIN) cream Apply 1 application topically as needed for dry skin.   Yes [provider]    Physical Exam: Vitals:   03/16/18 0846 03/16/18 0920 03/16/18 0930 03/16/18 1110  BP:   (!) 198/106 (!) 203/97  Pulse:  (!) 128 (!) 121 (!) 117  Resp:   19 (!) 23 20  Temp: (!) 100.6 F (38.1 C)   100.2 F (37.9 C)  TempSrc: Rectal   Oral  SpO2:  100% 97% 93%  Weight:      Height:        Constitutional: Obese, in no acute distress, denies chest pain, no shortness of breath.  Complaining of lower extremity swelling, redness and purulent boils. Eyes: PERTLA, lids and conjunctivae normal, no icterus, no nystagmus. ENMT: Mucous membranes are moist. Posterior pharynx clear of any exudate or lesions. No rash Neck: supple, no masses, no thyromegaly, no JVD (even difficult to properly assess secondary to body habitus). Respiratory: clear to auscultation bilaterally, no wheezing, no crackles. Normal respiratory effort. No accessory muscle use.  Cardiovascular: Tachycardic, no rubs, no gallops, no murmurs. 2+ pedal pulses. No carotid bruits.  Abdomen: No distension, no tenderness, no masses palpated. No hepatosplenomegaly. Bowel sounds normal.  Musculoskeletal: no clubbing / cyanosis. No joint deformity upper and lower extremities. Good ROM, no contractures.  Skin: Patient with Psoriatic rash/lesions affecting both elbows, mid forehead, umbilical region, lower back and gluteal folds. Also with bilateral legs with erythema, swelling and purulent boils. Neurologic: CN 2-12 grossly intact. Sensation intact, DTR normal. Strength 5/5 in all 4 limbs.  Psychiatric: Normal judgment and insight. Alert and oriented x 3. Normal mood.    Labs on Admission: I have personally reviewed following labs and imaging studies  CBC: Recent Labs  Lab 03/16/18 0751  WBC 16.4*  NEUTROABS 11.9*  HGB 14.2  HCT 42.2  MCV 93.6  PLT 222   Basic Metabolic Panel: Recent Labs  Lab 03/16/18 0751  NA 134*  K 3.3*  CL 97*  CO2 26  GLUCOSE 174*  BUN 8  CREATININE 0.73  CALCIUM 8.9   Liver Function Tests: Recent Labs  Lab 03/16/18 0751  AST 24  ALT 28  ALKPHOS 146*  BILITOT 0.6  PROT 7.9  ALBUMIN 3.7   Urine analysis:    Component Value Date/Time    COLORURINE YELLOW 03/16/2018 0740   APPEARANCEUR CLEAR 03/16/2018 0740   LABSPEC 1.003 (L) 03/16/2018 0740   PHURINE 7.0 03/16/2018 0740   GLUCOSEU NEGATIVE 03/16/2018 0740   HGBUR NEGATIVE 03/16/2018 0740   BILIRUBINUR NEGATIVE 03/16/2018 0740   KETONESUR NEGATIVE 03/16/2018 0740   PROTEINUR NEGATIVE 03/16/2018 0740   UROBILINOGEN 0.2 09/25/2008 0755   NITRITE NEGATIVE 03/16/2018 0740   LEUKOCYTESUR NEGATIVE 03/16/2018 0740    Radiological Exams on Admission: Dg Chest 2 View  Result Date: 03/16/2018 CLINICAL DATA:  Cough. EXAM: CHEST - 2 VIEW COMPARISON:  Radiographs of November 25, 2015. FINDINGS: The heart size and mediastinal contours are within normal limits. Both lungs are clear. No pneumothorax or pleural effusion is noted. The visualized skeletal structures are unremarkable. IMPRESSION: No active cardiopulmonary disease. Electronically Signed   By: Marijo Conception, M.D.   On: 03/16/2018 10:53   Assessment/Plan 1-Bilateral lower leg cellulitis -She with bilateral lower extremity swelling, redness and  purulent blisters that had developed of the last 48 hours prior to admission. -continue IV vancomycin as per cellulitis protocol -wound care service consulted -continue IVF's and follow clinical response   2-sepsis -patient met sepsis criteria with: cellulitis as source, elevated WBC's, fever and tachycardia. -continue IVF's -follow response on sepsis features -contninue IV antibiotics  3-Benign essential HTN -elevated on admission after missing home dose meds -will resume home medications and adjust as needed   4-HLD (hyperlipidemia) -continue statin  5-Obesity, Class III, BMI 40-49.9 (morbid obesity) ([place]) -Body mass index is 40.35 kg/m. -Low calorie diet, portion control and increase physical activity has been discussed with patient.  6-Psoriasis -Currently not receiving any treatment for psoriasis -Will benefit of outpatient dermatology evaluation. -continue  atarax for itching  7-GERD (gastroesophageal reflux disease) -continue PPI  8-Asthma -Good air movement, no wheezing and denying shortness of breath. -Continue as needed albuterol.  9-Hypothyroidism  -check TSH -continue synthroid   10-lactic acidosis -in the setting of sepsis -will continue IVF's -follow lactic acid trend   11-hypokalemia -We will check magnesium level -Provide potassium repletion -Follow electrolytes trend in a.m.  DVT prophylaxis: heparin  Code Status: full Family Communication: no family at bedside. Disposition Plan: Home when medically stable; most likely in 2-3 days Consults called: Wound care Admission status: Inpatient, MedSurg, length of stay more than 2 midnights.   Time: 70 minutes.  Barton Dubois MD [facility] Pager [phone]  If 7PM-7AM, please contact night-coverage [email or web address] Password [facility]  03/16/2018, 4:17 PM

## 2018-03-16 NOTE — ED Triage Notes (Signed)
Pt c/o leg swelling, blisters on both legs, and edema since Sunday. States he was sitting outside when he felt his legs burning. Unsure of cause. Pt also reports migraine HA x 1 week.

## 2018-03-16 NOTE — ED Provider Notes (Signed)
[facility] EMERGENCY DEPARTMENT Provider Note   CSN: [id number] Arrival date & time: 03/16/18  4098     History   Chief Complaint Chief Complaint  Patient presents with  . Leg Problem    HPI [name] is a 52 y.o. male.  HPI  52 year old male presents with bilateral leg swelling and redness.  He had multiple blisters as well.  Started 2 days ago.  He did gone to the son but the started before he was on the sun for more than a few minutes.  Somewhat painful and swelling and tight.  No fevers.  He had a little bit of cough.  He denies specific shortness of breath however.  He has had many blisters on both extremities and some have popped.  It appears pus like when the blisters popped. Right leg is always somewhat more swollen than left.  Past Medical History:  Diagnosis Date  . Acid reflux disease   . Arthritis   . Asthma   . Gout   . Headache   . High blood pressure   . High cholesterol   . Psoriasis     Patient Active Problem List   Diagnosis Date Noted  . ASEPTIC NECROSIS 09/16/2007  . HIGH BLOOD PRESSURE 08/17/2007    Past Surgical History:  Procedure Laterality Date  . JOINT REPLACEMENT     two total hip replacements  . MULTIPLE EXTRACTIONS WITH ALVEOLOPLASTY N/A 06/19/2017   Procedure: MULTIPLE EXTRACTION WITH ALVEOLOPLASTY;  Surgeon: Ocie Doyne, DDS;  Location: [facility] OR;  Service: Oral Surgery;  Laterality: N/A;  . SPINAL FUSION    . TOTAL HIP ARTHROPLASTY          Home Medications    Prior to Admission medications   Medication Sig Start Date End Date Taking? Authorizing Provider  albuterol (PROVENTIL HFA;VENTOLIN HFA) 108 (90 BASE) MCG/ACT inhaler Inhale 2 puffs into the lungs 4 (four) times daily as needed for wheezing or shortness of breath.     [provider]  atorvastatin (LIPITOR) 40 MG tablet Take 40 mg by mouth daily.    [provider]  cyclobenzaprine (FLEXERIL) 10 MG tablet Take 1 tablet (10 mg total) by mouth 3  (three) times daily as needed for muscle spasms. 06/19/17   Ocie Doyne, DDS  doxycycline (VIBRA-TABS) 100 MG tablet Take 1 tablet (100 mg total) by mouth 2 (two) times daily. 10/29/17   Ivery Quale, PA-C  hydrOXYzine (ATARAX/VISTARIL) 25 MG tablet Take 1 tablet (25 mg total) by mouth every 6 (six) hours as needed for itching. 03/15/15   Triplett, Tammy, PA-C  ibuprofen (ADVIL,MOTRIN) 400 MG tablet Take 1 tablet (400 mg total) by mouth 4 (four) times daily. 10/29/17   Ivery Quale, PA-C  levothyroxine (SYNTHROID, LEVOTHROID) 50 MCG tablet Take 50 mcg by mouth daily before breakfast.    [provider]  lisinopril (PRINIVIL,ZESTRIL) 20 MG tablet Take 20 mg by mouth daily.    [provider]  naproxen (NAPROSYN) 500 MG tablet Take 500 mg by mouth daily.     [provider]  omeprazole (PRILOSEC) 20 MG capsule Take 20 mg by mouth daily as needed (for acid reflux).     [provider]  oxyCODONE (OXY IR/ROXICODONE) 5 MG immediate release tablet Take 5 mg by mouth every 6 (six) hours as needed for moderate pain.    [provider]  permethrin (ELIMITE) 5 % cream Apply from neck down, leave on 14 hrs then wash off.  May  re-apply in one week if needed Patient not taking: Reported on 11/25/2015 03/15/15   Pauline Ausriplett, Tammy, PA-C  Skin Protectants, Misc. (EUCERIN) cream Apply 1 application topically as needed for dry skin.    [provider]  tamsulosin (FLOMAX) 0.4 MG CAPS capsule Take 1 capsule (0.4 mg total) by mouth daily. Patient not taking: Reported on 06/19/2017 11/25/15   Donnetta Hutchingook, Brian, MD  traMADol (ULTRAM) 50 MG tablet Take 1 tablet (50 mg total) by mouth every 6 (six) hours as needed. 10/29/17   Ivery QualeBryant, Hobson, PA-C    Family History Family History  Problem Relation Age of Onset  . Diabetes Other     Social History Social History   Tobacco Use  . Smoking status: Current Every Day Smoker    Packs/day: 0.50    Years: 20.00    Pack years:  10.00    Types: Cigarettes  . Smokeless tobacco: Never Used  . Tobacco comment: patient states he smokes daily but only socially   Substance Use Topics  . Alcohol use: Yes    Comment: occasionally  . Drug use: No     Allergies   Patient has no known allergies.   Review of Systems Review of Systems  Constitutional: Negative for fever.  Respiratory: Positive for cough (on and off due to asthma, currently has cough). Negative for shortness of breath.   Cardiovascular: Positive for leg swelling. Negative for chest pain.  Skin: Positive for color change.  Neurological: Positive for headaches.  All other systems reviewed and are negative.    Physical Exam Updated Vital Signs BP (!) 203/107   Pulse (!) 122   Temp 99.8 F (37.7 C) (Oral)   Resp 20   Ht 5\' 6"  (1.676 m)   Wt 113.4 kg (250 lb)   SpO2 95%   BMI 40.35 kg/m   Physical Exam  Constitutional: He is oriented to person, place, and time. He appears well-developed and well-nourished. No distress.  HENT:  Head: Normocephalic and atraumatic.  Right Ear: External ear normal.  Left Ear: External ear normal.  Nose: Nose normal.  Eyes: Right eye exhibits no discharge. Left eye exhibits no discharge.  Neck: Neck supple.  Cardiovascular: Regular rhythm and normal heart sounds. Tachycardia present.  Pulses:      Dorsalis pedis pulses are 2+ on the right side, and 2+ on the left side.  Pulmonary/Chest: Effort normal and breath sounds normal. He has no wheezes. He has no rales.  Abdominal: Soft. There is no tenderness.  Musculoskeletal: He exhibits no edema.  See pictures. Bilateral redness to lower extremity that are warm to touch. Multiple blisters that appear pustular. No pitting edema. RLE slightly more swollen than left  Neurological: He is alert and oriented to person, place, and time.  Skin: Skin is warm and dry. He is not diaphoretic.  Nursing note and vitals reviewed.     ED Treatments / Results  Labs (all  labs ordered are listed, but only abnormal results are displayed) Labs Reviewed  CBC WITH DIFFERENTIAL/PLATELET - Abnormal; Notable for the following components:      Result Value   WBC 16.4 (*)    Neutro Abs 11.9 (*)    Monocytes Absolute 1.7 (*)    All other components within normal limits  URINALYSIS, ROUTINE W REFLEX MICROSCOPIC - Abnormal; Notable for the following components:   Specific Gravity, Urine 1.003 (*)    All other components within normal limits  CULTURE, BLOOD (ROUTINE X 2)  CULTURE, BLOOD (  ROUTINE X 2)  COMPREHENSIVE METABOLIC PANEL  LACTIC ACID, PLASMA  LACTIC ACID, PLASMA    EKG EKG Interpretation  Date/Time:  Tuesday March 16 2018 08:51:43 EDT Ventricular Rate:  122 PR Interval:    QRS Duration: 77 QT Interval:  305 QTC Calculation: 435 R Axis:   27 Text Interpretation:  Sinus tachycardia Minimal ST depression, lateral leads no significant change since Feb 2017 Confirmed by Pricilla Loveless [phone]) on 03/16/2018 3:04:33 PM   Radiology Dg Chest 2 View  Result Date: 03/16/2018 CLINICAL DATA:  Cough. EXAM: CHEST - 2 VIEW COMPARISON:  Radiographs of November 25, 2015. FINDINGS: The heart size and mediastinal contours are within normal limits. Both lungs are clear. No pneumothorax or pleural effusion is noted. The visualized skeletal structures are unremarkable. IMPRESSION: No active cardiopulmonary disease. Electronically Signed   By: Lupita Raider, M.D.   On: 03/16/2018 10:53    Procedures Procedures (including critical care time)  Medications Ordered in ED Medications  piperacillin-tazobactam (ZOSYN) IVPB 3.375 g (has no administration in time range)  vancomycin (VANCOCIN) IVPB 1000 mg/200 mL premix (has no administration in time range)  sodium chloride 0.9 % bolus 1,000 mL (has no administration in time range)     Initial Impression / Assessment and Plan / ED Course  I have reviewed the triage vital signs and the nursing notes.  Pertinent labs &  imaging results that were available during my care of the patient were reviewed by me and considered in my medical decision making (see chart for details).     Patient presents with low-grade fever, tachycardia, and signs/symptoms of an acute skin infection.  It is odd that is bilateral but given his blisters/pustules in the setting of cellulitis, this does appear to be an infectious process.  He has no significant pain however or tenderness to palpation to suggest deep space infection.  He will be given broad antibiotics.  He was treated per sepsis protocol.  Discussed with Dr. Gwenlyn Perking, who will admit.  Final Clinical Impressions(s) / ED Diagnoses   Final diagnoses:  Cellulitis of lower extremity, unspecified laterality    ED Discharge Orders    None       Pricilla Loveless, MD 03/16/18 1505

## 2018-03-16 NOTE — ED Notes (Signed)
Family at bedside. 

## 2018-03-16 NOTE — Progress Notes (Addendum)
Pharmacy Antibiotic Note  Vivien RossettiCharlie R Lowrimore is a 52 y.o. male admitted on 03/16/2018 with cellulitis/sepsis  Pharmacy has been consulted for Vancomycin and Zosyn dosing.  Plan: Vancomycin 1500 mg IV x 1 dose Vancomycin 1000 mg IV every 12 hours.  Goal trough 15-20 mcg/mL. Zosyn 3.375g IV q8h (4 hour infusion).  Monitor labs, c/s, and vanco trough as indicated  Height: 5\' 6"  (167.6 cm) Weight: 250 lb (113.4 kg) IBW/kg (Calculated) : 63.8  Temp (24hrs), Avg:100.2 F (37.9 C), Min:99.8 F (37.7 C), Max:100.6 F (38.1 C)  Recent Labs  Lab 03/16/18 0751 03/16/18 0939  WBC 16.4*  --   CREATININE 0.73  --   LATICACIDVEN 2.5* 1.5    Estimated Creatinine Clearance: 127.7 mL/min (by C-G formula based on SCr of 0.73 mg/dL).    No Known Allergies  Antimicrobials this admission: Zosyn 6/18 >>  Vanco 6/18 >>   Dose adjustments this admission: N/A  Microbiology results: 6/18 BCx: pending   Thank you for allowing pharmacy to be a part of this patient's care.  Tad MooreSteven C Penina Reisner 03/16/2018 5:04 PM

## 2018-03-17 LAB — BASIC METABOLIC PANEL
ANION GAP: 7 (ref 5–15)
BUN: 6 mg/dL (ref 6–20)
CHLORIDE: 105 mmol/L (ref 101–111)
CO2: 26 mmol/L (ref 22–32)
Calcium: 8.4 mg/dL — ABNORMAL LOW (ref 8.9–10.3)
Creatinine, Ser: 0.64 mg/dL (ref 0.61–1.24)
GFR calc Af Amer: >60 mL/min (ref >60)
Glucose, Bld: 115 mg/dL — ABNORMAL HIGH (ref 65–99)
POTASSIUM: 4 mmol/L (ref 3.5–5.1)
SODIUM: 138 mmol/L (ref 135–145)

## 2018-03-17 LAB — CBC
HEMATOCRIT: 37.9 % — AB (ref 39.0–52.0)
HEMOGLOBIN: 12.5 g/dL — AB (ref 13.0–17.0)
MCH: 30.9 pg (ref 26.0–34.0)
MCHC: 33 g/dL (ref 30.0–36.0)
MCV: 93.8 fL (ref 78.0–100.0)
Platelets: 284 10*3/uL (ref 150–400)
RBC: 4.04 MIL/uL — ABNORMAL LOW (ref 4.22–5.81)
RDW: 13.5 % (ref 11.5–15.5)
WBC: 12.4 10*3/uL — AB (ref 4.0–10.5)

## 2018-03-17 LAB — LACTIC ACID, PLASMA: Lactic Acid, Venous: 1.3 mmol/L (ref 0.5–1.9)

## 2018-03-17 LAB — HIV ANTIBODY (ROUTINE TESTING W REFLEX): HIV Screen 4th Generation wRfx: NONREACTIVE

## 2018-03-17 NOTE — Progress Notes (Signed)
She admitted with bilateral lower extremity cellulitis because presumably by chronic psoriasis placed on vanken Zosyn lactic acid now normalized [name] [id number] DOB: 1966/05/14 DOA: 03/16/2018 PCP: Ricky Linseyondiego, Ricky Catalfamo, MD   Physical Exam: Blood pressure (!) 145/85, pulse 94, temperature 99.7 F (37.6 C), temperature source Oral, resp. rate 18, height 5\' 6"  (1.676 m), weight 113.4 kg (250 lb), SpO2 99 %.  Lungs clear to A&P no rales wheeze rhonchi with a prolonged expiratory phase heart regular rhythm no S3-S4 no heaves thrills or rubs   Investigations:  Recent Results (from the past 240 hour(s))  Blood Culture (routine x 2)     Status: None (Preliminary result)   Collection Time: 03/16/18  9:06 AM  Result Value Ref Range Status   Specimen Description BLOOD LEFT ARM  Final   Special Requests   Final    BOTTLES DRAWN AEROBIC AND ANAEROBIC Blood Culture adequate volume   Culture   Final    NO GROWTH < 24 HOURS Performed at [facility], [address] [place], [place] [address]    Report Status PENDING  Incomplete  Blood Culture (routine x 2)     Status: None (Preliminary result)   Collection Time: 03/16/18  9:06 AM  Result Value Ref Range Status   Specimen Description BLOOD RIGHT ARM  Final   Special Requests   Final    BOTTLES DRAWN AEROBIC AND ANAEROBIC Blood Culture adequate volume   Culture   Final    NO GROWTH < 24 HOURS Performed at [facility], [address] [place], [place] [address]    Report Status PENDING  Incomplete     Basic Metabolic Panel: Recent Labs    03/16/18 0751 03/16/18 1730 03/17/18 0526  NA 134*  --  138  K 3.3*  --  4.0  CL 97*  --  105  CO2 26  --  26  GLUCOSE 174*  --  115*  BUN 8  --  6  CREATININE 0.73  --  0.64  CALCIUM 8.9  --  8.4*  MG  --  1.9  --    Liver Function Tests: Recent Labs    03/16/18 0751  AST 24  ALT 28  ALKPHOS 146*  BILITOT 0.6  PROT 7.9  ALBUMIN 3.7     CBC: Recent Labs    03/16/18 0751  03/17/18 0526  WBC 16.4* 12.4*  NEUTROABS 11.9*  --   HGB 14.2 12.5*  HCT 42.2 37.9*  MCV 93.6 93.8  PLT 278 284    Dg Chest 2 View  Result Date: 03/16/2018 CLINICAL DATA:  Cough. EXAM: CHEST - 2 VIEW COMPARISON:  Radiographs of November 25, 2015. FINDINGS: The heart size and mediastinal contours are within normal limits. Both lungs are clear. No pneumothorax or pleural effusion is noted. The visualized skeletal structures are unremarkable. IMPRESSION: No active cardiopulmonary disease. Electronically Signed   By: Lupita RaiderJames  Green Jr, M.D.   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  Dermatology referral but nobody would take Medicaid  Consultants:    Procedures   Antibiotics: Vancomycin and Zosyn IV        Time spent:  30 minutes   LOS: 1 day   [name] M   03/17/2018, 12:37 PM

## 2018-03-17 NOTE — Progress Notes (Signed)
Patient bathed and washed legs with warm soapy water - vaseline gauze placed on boils and open sores and wrapped with kerlix per WOC recommendations

## 2018-03-17 NOTE — Progress Notes (Signed)
Dressing to bilateral legs removed. Legs washed with warm, soapy water and patted dry. Vaseline gauze applied to boils and to open sores and Kerlex applied.

## 2018-03-17 NOTE — Consult Note (Signed)
WOC Nurse wound consult note Reason for Consult: Boils on legs Wound type: Unknown etiology POA: Yes Consult was completed by aide of the photo in the chart and discussion with primary RN, Aundra MilletMegan.   I have placed a topical care order for:  Wash bilateral lower legs with warm water and soap. Pat dry. Place a vaseline gauze over all areas with boils.  Secure with Kerlex. Perform each shift.  My main recommendation is to have Infectious Disease Service consult on the patient to assist with determining the origin of the process.  Please order if you agree.  I have paged Dr. Janna Archondiego for discussion of this recommendation.  I am awaiting a call back. Monitor the wound area(s) for worsening of condition such as: Signs/symptoms of infection,  Increase in size,  Development of or worsening of odor, Development of pain, or increased pain at the affected locations.  Notify the medical team if any of these develop.  Thank you for the consult.  Discussed plan of care with the patient and bedside nurse.  WOC nurse will not follow at this time.  Please re-consult the WOC team if needed.  Helmut MusterSherry Philander Ake, RN, MSN, CWOCN, CNS-BC, pager [phone]

## 2018-03-18 NOTE — Progress Notes (Signed)
Legs washed with soapy water and dressing changed.  Minimal light green drainage noted

## 2018-03-18 NOTE — Progress Notes (Signed)
Patient on 48 hours of Vanco and Zosyn for lower leg cellulitis she has likewise active psoriasis of other extensor joints uncertain if this is related to her underlying psoriasis or not patient had been given Eucrisa cream for psoriasis outbreaks not able to see dermatology due to not accepting Medicaid level of rubor of both lower extremities somewhat improved ZAKAR BROSCH [id number]:[id number] DOB: 02/24/1966 DOA: 03/16/2018 PCP: Lucia Gaskins, MD   Physical Exam: Blood pressure (!) 171/94, pulse 86, temperature 98.4 F (36.9 C), temperature source Oral, resp. rate 18, height '5\' 6"'  (1.676 m), weight 113.4 kg (250 lb), SpO2 98 %.  Lungs clear to A&P no rales wheeze rhonchi heart regular rhythm no S3-S4 no heaves thrills or rubs lower extremities as described above   Investigations:  Recent Results (from the past 240 hour(s))  Blood Culture (routine x 2)     Status: None (Preliminary result)   Collection Time: 03/16/18  9:06 AM  Result Value Ref Range Status   Specimen Description BLOOD LEFT ARM  Final   Special Requests   Final    BOTTLES DRAWN AEROBIC AND ANAEROBIC Blood Culture adequate volume   Culture   Final    NO GROWTH 2 DAYS Performed at [facility], [address] [place], [place] [address]    Report Status PENDING  Incomplete  Blood Culture (routine x 2)     Status: None (Preliminary result)   Collection Time: 03/16/18  9:06 AM  Result Value Ref Range Status   Specimen Description BLOOD RIGHT ARM  Final   Special Requests   Final    BOTTLES DRAWN AEROBIC AND ANAEROBIC Blood Culture adequate volume   Culture   Final    NO GROWTH 2 DAYS Performed at [facility], [address], [place], [place] [address]    Report Status PENDING  Incomplete     Basic Metabolic Panel: Recent Labs    03/16/18 0751 03/16/18 1730 03/17/18 0526  NA 134*  --  138  K 3.3*  --  4.0  CL 97*  --  105  CO2 26  --  26  GLUCOSE 174*  --  115*  BUN 8  --  6  CREATININE 0.73  --  0.64   CALCIUM 8.9  --  8.4*  MG  --  1.9  --    Liver Function Tests: Recent Labs    03/16/18 0751  AST 24  ALT 28  ALKPHOS 146*  BILITOT 0.6  PROT 7.9  ALBUMIN 3.7     CBC: Recent Labs    03/16/18 0751 03/17/18 0526  WBC 16.4* 12.4*  NEUTROABS 11.9*  --   HGB 14.2 12.5*  HCT 42.2 37.9*  MCV 93.6 93.8  PLT 278 284    No results found.    Medications:  Impression:  Principal Problem:   Bilateral lower leg cellulitis Active Problems:   Benign essential HTN   HLD (hyperlipidemia)   Obesity, Class III, BMI 40-49.9 (morbid obesity) (HCC)   Psoriasis   GERD (gastroesophageal reflux disease)   Asthma   Hypothyroidism     Plan: Continue Vanco Zosyn we will check CBC in a.m. for leukocytosis and be met for renal function on nephrotoxic IV antibiotics  Consultants:    Procedures   Antibiotics: Vancomycin and Zosyn       Time spent: 30 minutes   LOS: 2 days   Arlethia Basso M   03/18/2018, 12:42 PM

## 2018-03-19 LAB — CBC WITH DIFFERENTIAL/PLATELET
BASOS PCT: 1 %
Basophils Absolute: 0.1 10*3/uL (ref 0.0–0.1)
Eosinophils Absolute: 0.3 10*3/uL (ref 0.0–0.7)
Eosinophils Relative: 3 %
HEMATOCRIT: 38.2 % — AB (ref 39.0–52.0)
HEMOGLOBIN: 12.8 g/dL — AB (ref 13.0–17.0)
LYMPHS ABS: 1.9 10*3/uL (ref 0.7–4.0)
LYMPHS PCT: 19 %
MCH: 30.8 pg (ref 26.0–34.0)
MCHC: 33.5 g/dL (ref 30.0–36.0)
MCV: 92 fL (ref 78.0–100.0)
MONOS PCT: 9 %
Monocytes Absolute: 0.9 10*3/uL (ref 0.1–1.0)
NEUTROS ABS: 6.7 10*3/uL (ref 1.7–7.7)
NEUTROS PCT: 68 %
Platelets: 360 10*3/uL (ref 150–400)
RBC: 4.15 MIL/uL — ABNORMAL LOW (ref 4.22–5.81)
RDW: 13.2 % (ref 11.5–15.5)
WBC: 9.8 10*3/uL (ref 4.0–10.5)

## 2018-03-19 LAB — BASIC METABOLIC PANEL
ANION GAP: 8 (ref 5–15)
BUN: 6 mg/dL (ref 6–20)
CO2: 24 mmol/L (ref 22–32)
Calcium: 8.7 mg/dL — ABNORMAL LOW (ref 8.9–10.3)
Chloride: 107 mmol/L (ref 101–111)
Creatinine, Ser: 0.66 mg/dL (ref 0.61–1.24)
GFR calc non Af Amer: >60 mL/min (ref >60)
Glucose, Bld: 118 mg/dL — ABNORMAL HIGH (ref 65–99)
POTASSIUM: 3.4 mmol/L — AB (ref 3.5–5.1)
Sodium: 139 mmol/L (ref 135–145)

## 2018-03-19 LAB — VANCOMYCIN, TROUGH: Vancomycin Tr: 11 ug/mL — ABNORMAL LOW (ref 15–20)

## 2018-03-19 MED ORDER — POTASSIUM CHLORIDE CRYS ER 20 MEQ PO TBCR
20.0000 meq | EXTENDED_RELEASE_TABLET | Freq: Every day | ORAL | Status: DC
  Administered 2018-03-19: 20 meq via ORAL
  Filled 2018-03-19: qty 1

## 2018-03-19 MED ORDER — POTASSIUM CHLORIDE CRYS ER 20 MEQ PO TBCR: 20.0000 meq | EXTENDED_RELEASE_TABLET | Freq: Every day | ORAL | 0 refills | Status: DC

## 2018-03-19 MED ORDER — VANCOMYCIN HCL IN DEXTROSE 1-5 GM/200ML-% IV SOLN: 1000.0000 mg | Freq: Two times a day (BID) | INTRAVENOUS | 0 refills | Status: DC

## 2018-03-19 MED ORDER — ALBUTEROL SULFATE (2.5 MG/3ML) 0.083% IN NEBU: 3.0000 mL | INHALATION_SOLUTION | Freq: Four times a day (QID) | RESPIRATORY_TRACT | 12 refills | Status: DC | PRN

## 2018-03-19 MED ORDER — SODIUM CHLORIDE 0.9% FLUSH: 10.0000 mL | INTRAVENOUS | Status: DC | PRN

## 2018-03-19 MED ORDER — AMLODIPINE BESYLATE 5 MG PO TABS
5.0000 mg | ORAL_TABLET | Freq: Every day | ORAL | Status: DC
  Administered 2018-03-19: 5 mg via ORAL
  Filled 2018-03-19: qty 1

## 2018-03-19 MED ORDER — SODIUM CHLORIDE 0.9% FLUSH: 10.0000 mL | Freq: Two times a day (BID) | INTRAVENOUS | Status: DC

## 2018-03-19 MED ORDER — AMLODIPINE BESYLATE 5 MG PO TABS: 5.0000 mg | ORAL_TABLET | Freq: Every day | ORAL | 1 refills | Status: DC

## 2018-03-19 MED ORDER — HEPARIN SOD (PORK) LOCK FLUSH 100 UNIT/ML IV SOLN
250.0000 [IU] | INTRAVENOUS | Status: AC | PRN
  Administered 2018-03-19: 250 [IU]
  Filled 2018-03-19: qty 5

## 2018-03-19 NOTE — Progress Notes (Addendum)
Patient with bilateral lower extremity significant cellulitis on vancomycin and Zosyn with improvement of lower leg cellulitis.  Patient will require approximately 10 days to 2 weeks of intravenous vancomycin plus minus Zosyn and will consider a rehab stay or home health nurse with IV infusion therapy?  Likewise has hypertension hyperlipidemia significant psoriasis hypothyroidism and GERD is hemodynamically stable and his other health issues appear to be under fair control however blood pressure significantly elevated will address this today [name] [id number] DOB: 21-May-1966 DOA: 03/16/2018 PCP: Oval Linsey, MD   Physical Exam: Blood pressure (!) 167/97, pulse 94, temperature 98.4 F (36.9 C), temperature source Oral, resp. rate 20, height 5\' 6"  (1.676 m), weight 113.4 kg (250 lb), SpO2 95 %.  Lungs clear to A&P no rales wheeze rhonchi heart regular rhythm no S3-S4 no heaves thrills rubs abdomen soft nontender bowel sounds normoactive legs reveal bandages cellulitis not visible   Investigations:  Recent Results (from the past 240 hour(s))  Blood Culture (routine x 2)     Status: None (Preliminary result)   Collection Time: 03/16/18  9:06 AM  Result Value Ref Range Status   Specimen Description BLOOD LEFT ARM  Final   Special Requests   Final    BOTTLES DRAWN AEROBIC AND ANAEROBIC Blood Culture adequate volume   Culture   Final    NO GROWTH 3 DAYS Performed at [facility], [address], [place], [place] [address]    Report Status PENDING  Incomplete  Blood Culture (routine x 2)     Status: None (Preliminary result)   Collection Time: 03/16/18  9:06 AM  Result Value Ref Range Status   Specimen Description BLOOD RIGHT ARM  Final   Special Requests   Final    BOTTLES DRAWN AEROBIC AND ANAEROBIC Blood Culture adequate volume   Culture   Final    NO GROWTH 3 DAYS Performed at [facility], [address], [place], [place] [address]    Report Status PENDING  Incomplete      Basic Metabolic Panel: Recent Labs    03/16/18 1730 03/17/18 0526 03/19/18 0430  NA  --  138 139  K  --  4.0 3.4*  CL  --  105 107  CO2  --  26 24  GLUCOSE  --  115* 118*  BUN  --  6 6  CREATININE  --  0.64 0.66  CALCIUM  --  8.4* 8.7*  MG 1.9  --   --    Liver Function Tests: No results for input(s): AST, ALT, ALKPHOS, BILITOT, PROT, ALBUMIN in the last 72 hours.   CBC: Recent Labs    03/17/18 0526 03/19/18 0430  WBC 12.4* 9.8  NEUTROABS  --  6.7  HGB 12.5* 12.8*  HCT 37.9* 38.2*  MCV 93.8 92.0  PLT 284 360    No results found.    Medications:   Impression:  Principal Problem:   Bilateral lower leg cellulitis Active Problems:   Benign essential HTN   HLD (hyperlipidemia)   Obesity, Class III, BMI 40-49.9 (morbid obesity) (HCC)   Psoriasis   GERD (gastroesophageal reflux disease)   Asthma   Hypothyroidism     Plan: Physical therapy for consideration of rehab stay in skilled nursing facility.  Her options at home IV infusion of vancomycin for period of approximately 2 weeks depending on clinical response we will add Norvasc 5 mg to his current regimen.  We will add K-Dur 20 milliequivalents p.o. daily x2 days only  check potassium Sunday a.m. PICC line to be inserted for IV antibiotics should a bed become available  Consultants:   Procedures  Antibiotics: Vancomycin and Zosyn IV         Time spent: 30 minutes   LOS: 3 days   Inocente Krach M   03/19/2018, 11:16 AM

## 2018-03-19 NOTE — Clinical Social Work Note (Signed)
Notified by RN that MD would like to dc pt to a SNF for IV antibiotic therapy. RN CM spoke with pt who [facility]. Referrals sent. Will work on placement.

## 2018-03-19 NOTE — Progress Notes (Signed)
Dressings on BLE changed.

## 2018-03-19 NOTE — Progress Notes (Signed)
Patient is to be [facility] and in stable condition. Patient's IV removed, WNL. Patient's PICC line in place and flushed with NS and heparin. Patient and family made aware of discharge. Patient to be escorted by EMS.  Quita SkyeMorgan P Dishmon, RN

## 2018-03-19 NOTE — Clinical Social Work Placement (Signed)
   CLINICAL SOCIAL WORK PLACEMENT  NOTE  Date:  03/19/2018  Patient Details  Name: [name] MRN: [id number] Date of Birth: 01-25-1966  Clinical Social Work is seeking post-discharge placement for this patient at the Skilled  Nursing Facility level of care (*CSW will initial, date and re-position this form in  chart as items are completed):  Yes   Patient/family provided with [facility] Clinical Social Work Department's list of facilities offering this level of care within the geographic area requested by the patient (or if unable, by the patient's family).  Yes   Patient/family informed of their freedom to choose among providers that offer the needed level of care, that participate in Medicare, Medicaid or managed care program needed by the patient, have an available bed and are willing to accept the patient.  Yes   Patient/family informed of [facility]'s ownership interest in [facility] and [facility], as well as of the fact that they are under no obligation to receive care at these facilities.  PASRR submitted to EDS on       PASRR number received on       Existing PASRR number confirmed on 03/19/18     FL2 transmitted to all facilities in geographic area requested by pt/family on 03/19/18     FL2 transmitted to all facilities within larger geographic area on       Patient informed that his/her managed care company has contracts with or will negotiate with certain facilities, including the following:        Yes   Patient/family informed of bed offers received.  [facility]     Physician recommends and patient chooses bed at      Patient to be [facility] on 03/19/18.  Patient to be transferred to facility by rcems     Patient family notified on 03/19/18 of transfer.  Name of family member notified:  patient only     PHYSICIAN       Additional Comment: Pt will [facility] after PICC  line placement today. Discussed with RN, Lequita HaltMorgan. She will arrange EMS when pt ready and call report to [facility]. Updated Thayer Ohmhris at [facility]. No other LCSW needs for dc.   _______________________________________________ Elliot GaultKathleen Mirca Yale, LCSW 03/19/2018, 2:22 PM

## 2018-03-19 NOTE — Discharge Summary (Signed)
Physician Discharge Summary  [name] ZOX:[id number] DOB: [birth year] DOA: 03/16/2018  PCP: Oval Linsey, MD  Admit date: 03/16/2018 Discharge date: 03/19/2018   Recommendations for Outpatient Follow-up:  Patient is instructed in discharge to go to [facility] need for period of no less than 2 weeks to receive intravenous vancomycin 1500 mg IV daily via PICC line or IV line and to see how his lower leg cellulitis resolves he is likewise instructed to take all other prehospital admission medicines and discharge medicines for the same period of time and follow-up in my office in 2 weeks time Discharge Diagnoses:  Principal Problem:   Bilateral lower leg cellulitis Active Problems:   Benign essential HTN   HLD (hyperlipidemia)   Obesity, Class III, BMI 40-49.9 (morbid obesity) (HCC)   Psoriasis   GERD (gastroesophageal reflux disease)   Asthma   Hypothyroidism   Discharge Condition: Good  Filed Weights   03/16/18 0737  Weight: 113.4 kg (250 lb)    History of present illness:  The patient is a 52 year old white male with history of hypertension hyperlipidemia hypothyroidism next psoriasis who developed a lower leg bilateral cellulitis with erythema pruritus and multiple pustules seen in hospital in the ER found to have cellulitis and placed on vancomycin and Zosyn IV for peer to 3 days he took this while in hospital he was noted to be more hypertensive than usual presumably due to IV fluids and he had Norvasc 5 mg added to his antihypertensive regimen was felt that he would need at least 2 weeks of IV antibiotics vancomycin 1500 mg/day and this was scheduled at the nursing center [facility] in [place] he was getting a PICC line placed prior to his discharge for the administration of these antibiotics in the nursing center he will leave after PICC line is placed from any pain hospital  Hospital Course:  See HPI above  Procedures:  PICC line  placement  Consultations:    Discharge Instructions  Discharge Instructions    Discharge instructions   Complete by:  As directed      Allergies as of 03/19/2018   No Known Allergies     Medication List    TAKE these medications   albuterol 108 (90 Base) MCG/ACT inhaler Commonly known as:  PROVENTIL HFA;VENTOLIN HFA Inhale 2 puffs into the lungs 4 (four) times daily as needed for wheezing or shortness of breath. What changed:  Another medication with the same name was added. Make sure you understand how and when to take each.   albuterol (2.5 MG/3ML) 0.083% nebulizer solution Commonly known as:  PROVENTIL Inhale 3 mLs into the lungs 4 (four) times daily as needed for wheezing or shortness of breath. What changed:  You were already taking a medication with the same name, and this prescription was added. Make sure you understand how and when to take each.   amLODipine 5 MG tablet Commonly known as:  NORVASC Take 1 tablet (5 mg total) by mouth daily. Start taking on:  03/20/2018   atorvastatin 40 MG tablet Commonly known as:  LIPITOR Take 40 mg by mouth daily.   cholecalciferol 1000 units tablet Commonly known as:  VITAMIN D Take 5,000 Units by mouth daily.   eucerin cream Apply 1 application topically as needed for dry skin.   hydrOXYzine 25 MG tablet Commonly known as:  ATARAX/VISTARIL Take 1 tablet (25 mg total) by mouth every 6 (six) hours as needed for itching.   levothyroxine 50 MCG tablet  Commonly known as:  SYNTHROID, LEVOTHROID Take 50 mcg by mouth daily before breakfast.   lisinopril 20 MG tablet Commonly known as:  PRINIVIL,ZESTRIL Take 20 mg by mouth daily.   naproxen 500 MG tablet Commonly known as:  NAPROSYN Take 500 mg by mouth daily.   omeprazole 20 MG capsule Commonly known as:  PRILOSEC Take 20 mg by mouth daily as needed (for acid reflux).   oxyCODONE 5 MG immediate release tablet Commonly known as:  Oxy IR/ROXICODONE Take 5 mg by  mouth every 6 (six) hours as needed for moderate pain.   potassium chloride SA 20 MEQ tablet Commonly known as:  K-DUR,KLOR-CON Take 1 tablet (20 mEq total) by mouth daily. Start taking on:  03/20/2018   vancomycin 1-5 GM/200ML-% Soln Commonly known as:  VANCOCIN Inject 200 mLs (1,000 mg total) into the vein every 12 (twelve) hours.      No Known Allergies    The results of significant diagnostics from this hospitalization (including imaging, microbiology, ancillary and laboratory) are listed below for reference.    Significant Diagnostic Studies: Dg Chest 2 View  Result Date: 03/16/2018 CLINICAL DATA:  Cough. EXAM: CHEST - 2 VIEW COMPARISON:  Radiographs of November 25, 2015. FINDINGS: The heart size and mediastinal contours are within normal limits. Both lungs are clear. No pneumothorax or pleural effusion is noted. The visualized skeletal structures are unremarkable. IMPRESSION: No active cardiopulmonary disease. Electronically Signed   By: Lupita RaiderJames  Green Jr, M.D.   On: 03/16/2018 10:53    Microbiology: Recent Results (from the past 240 hour(s))  Blood Culture (routine x 2)     Status: None (Preliminary result)   Collection Time: 03/16/18  9:06 AM  Result Value Ref Range Status   Specimen Description BLOOD LEFT ARM  Final   Special Requests   Final    BOTTLES DRAWN AEROBIC AND ANAEROBIC Blood Culture adequate volume   Culture   Final    NO GROWTH 3 DAYS Performed at [facility], [address] [place], [place] [address]    Report Status PENDING  Incomplete  Blood Culture (routine x 2)     Status: None (Preliminary result)   Collection Time: 03/16/18  9:06 AM  Result Value Ref Range Status   Specimen Description BLOOD RIGHT ARM  Final   Special Requests   Final    BOTTLES DRAWN AEROBIC AND ANAEROBIC Blood Culture adequate volume   Culture   Final    NO GROWTH 3 DAYS Performed at [facility], [address] [place], [place] [address]    Report Status PENDING   Incomplete     Labs: Basic Metabolic Panel: Recent Labs  Lab 03/16/18 0751 03/16/18 1730 03/17/18 0526 03/19/18 0430  NA 134*  --  138 139  K 3.3*  --  4.0 3.4*  CL 97*  --  105 107  CO2 26  --  26 24  GLUCOSE 174*  --  115* 118*  BUN 8  --  6 6  CREATININE 0.73  --  0.64 0.66  CALCIUM 8.9  --  8.4* 8.7*  MG  --  1.9  --   --    Liver Function Tests: Recent Labs  Lab 03/16/18 0751  AST 24  ALT 28  ALKPHOS 146*  BILITOT 0.6  PROT 7.9  ALBUMIN 3.7   No results for input(s): LIPASE, AMYLASE in the last 168 hours. No results for input(s): AMMONIA in the last 168 hours. CBC: Recent Labs  Lab 03/16/18  1610 03/17/18 0526 03/19/18 0430  WBC 16.4* 12.4* 9.8  NEUTROABS 11.9*  --  6.7  HGB 14.2 12.5* 12.8*  HCT 42.2 37.9* 38.2*  MCV 93.6 93.8 92.0  PLT 278 284 360   Cardiac Enzymes: No results for input(s): CKTOTAL, CKMB, CKMBINDEX, TROPONINI in the last 168 hours. BNP: BNP (last 3 results) No results for input(s): BNP in the last 8760 hours.  ProBNP (last 3 results) No results for input(s): PROBNP in the last 8760 hours.  CBG: No results for input(s): GLUCAP in the last 168 hours.     Signed:  Isabella Stalling   Pager: [phone] 03/19/2018, 1:08 PM

## 2018-03-19 NOTE — Progress Notes (Signed)
Pharmacy Antibiotic Note  [name] is a 52 y.o. male admitted on 03/16/2018 with cellulitis/sepsis. This is day #4 of vancomycin/Zosyn therapy.   Plan: [facility]. Would recommend obtaining trough weekly and watch for eventual accumulation of vancomycin. Vancomycin trough at 0900 today = 2011mcg/mL, which is in desired goal range for cellulitis Continue vancomycin 1g IV q12h.  Goal trough 10-15 mcg/mL. Continue Zosyn 3.375g IV q8h (4 hour infusion).  Monitor labs, c/s, and vanco trough as indicated  Height: 5\' 6"  (167.6 cm) Weight: 250 lb (113.4 kg) IBW/kg (Calculated) : 63.8  Temp (24hrs), Avg:98.3 F (36.8 C), Min:98.2 F (36.8 C), Max:98.4 F (36.9 C)  Recent Labs  Lab 03/16/18 0751 03/16/18 0939 03/17/18 0526 03/19/18 0430 03/19/18 0854  WBC 16.4*  --  12.4* 9.8  --   CREATININE 0.73  --  0.64 0.66  --   LATICACIDVEN 2.5* 1.5 1.3  --   --   VANCOTROUGH  --   --   --   --  11*    Estimated Creatinine Clearance: 127.7 mL/min (by C-G formula based on SCr of 0.66 mg/dL).    No Known Allergies  Antimicrobials this admission: Zosyn 6/18 >>  Vanco 6/18 >>   Dose adjustments this admission: N/A  Microbiology results: 6/18 BCx2: NG x3 days   Thank you for allowing pharmacy to be a part of this patient's care.  Tama Highamara Enedina Pair, RPh 03/19/2018 2:47 PM

## 2018-03-19 NOTE — Progress Notes (Signed)
Peripherally Inserted Central Catheter/Midline Placement  The IV Nurse has discussed with the patient and/or persons authorized to consent for the patient, the purpose of this procedure and the potential benefits and risks involved with this procedure.  The benefits include less needle sticks, lab draws from the catheter, and the patient may be discharged home with the catheter. Risks include, but not limited to, infection, bleeding, blood clot (thrombus formation), and puncture of an artery; nerve damage and irregular heartbeat and possibility to perform a PICC exchange if needed/ordered by physician.  Alternatives to this procedure were also discussed.  Bard Power PICC patient education guide, fact sheet on infection prevention and patient information card has been provided to patient /or left at bedside.    PICC/Midline Placement Documentation        Timmothy Soursewman, Tejal Monroy Renee 03/19/2018, 4:33 PM

## 2018-03-19 NOTE — Evaluation (Signed)
Physical Therapy Evaluation Patient Details Name: [name] MRN: [id number] DOB: [birth year] Today's Date: 03/19/2018   History of Present Illness  [name] is a 52 y/o man with past medical history significant psoriasis, morbid obesity, hypertension, hyperlipidemia, GERD and history of asthma; who presented to the ED with over 48 hours of lower extremity swelling, redness and purulent boils formation.  Patient reports initially just having the swelling and redness with a subsequent development of these boils/blisters with a purulent drainage.  He reports chills but never to his temperature at home.  Patient denies any insect bite, injury or trauma.     Clinical Impression  Patient functioning near baseline for functional mobility, demonstrates slightly labored slow cadence without loss of balance, mostly limited secondary to fatgue and BLE pain.  Patient to be discharged from hospital today. Plan:  Patient discharged from physical therapy to care of nursing.    Follow Up Recommendations SNF    Equipment Recommendations  None recommended by PT    Recommendations for Other Services       Precautions / Restrictions Precautions Precautions: None Restrictions Weight Bearing Restrictions: No      Mobility  Bed Mobility Overal bed mobility: Modified Independent             General bed mobility comments: uses side rail  Transfers Overall transfer level: Modified independent               General transfer comment: increased time  Ambulation/Gait Ambulation/Gait assistance: Modified independent (Device/Increase time) Gait Distance (Feet): 100 Feet Assistive device: None Gait Pattern/deviations: WFL(Within Functional Limits) Gait velocity: decreased   General Gait Details: grossly WFL except slightly labored slow cadence without loss of balance  Stairs            Wheelchair Mobility    Modified Rankin (Stroke Patients Only)       Balance  Overall balance assessment: Mild deficits observed, not formally tested                                           Pertinent Vitals/Pain Pain Assessment: 0-10 Pain Score: 7  Pain Location: 5/10 wounds on legs, 7/10 bilateral feet Pain Descriptors / Indicators: Aching;Discomfort Pain Intervention(s): Limited activity within patient's tolerance    Home Living Family/patient expects to be discharged to:: Private residence Living Arrangements: Non-relatives/Friends Available Help at Discharge: Friend(s) Type of Home: Mobile home Home Access: Stairs to enter Entrance Stairs-Rails: Can reach both Entrance Stairs-Number of Steps: 4 Home Layout: One level Home Equipment: None      Prior Function Level of Independence: Independent         Comments: household ambulator without AD     Hand Dominance        Extremity/Trunk Assessment   Upper Extremity Assessment Upper Extremity Assessment: Generalized weakness    Lower Extremity Assessment Lower Extremity Assessment: Generalized weakness    Cervical / Trunk Assessment Cervical / Trunk Assessment: Normal  Communication   Communication: No difficulties  Cognition Arousal/Alertness: Awake/alert Behavior During Therapy: WFL for tasks assessed/performed Overall Cognitive Status: Within Functional Limits for tasks assessed                                        General Comments  Exercises     Assessment/Plan    PT Assessment All further PT needs can be met in the next venue of care  PT Problem List Decreased strength;Decreased activity tolerance;Decreased balance;Decreased mobility       PT Treatment Interventions      PT Goals (Current goals can be found in the Care Plan section)  Acute Rehab PT Goals Patient Stated Goal: return home after rehab PT Goal Formulation: With patient Time For Goal Achievement: 04/02/18 Potential to Achieve Goals: Good    Frequency      Barriers to discharge        Co-evaluation               AM-PAC PT "6 Clicks" Daily Activity  Outcome Measure Difficulty turning over in bed (including adjusting bedclothes, sheets and blankets)?: None Difficulty moving from lying on back to sitting on the side of the bed? : None Difficulty sitting down on and standing up from a chair with arms (e.g., wheelchair, bedside commode, etc,.)?: None Help needed moving to and from a bed to chair (including a wheelchair)?: None Help needed walking in hospital room?: A Little Help needed climbing 3-5 steps with a railing? : A Little 6 Click Score: 22    End of Session   Activity Tolerance: Patient tolerated treatment well Patient left: in bed;with call bell/phone within reach Nurse Communication: Mobility status PT Visit Diagnosis: Unsteadiness on feet (R26.81);Other abnormalities of gait and mobility (R26.89);Muscle weakness (generalized) (M62.81)    Time: 1350-1416 PT Time Calculation (min) (ACUTE ONLY): 26 min   Charges:   PT Evaluation $PT Eval Moderate Complexity: 1 Mod PT Treatments $Therapeutic Activity: 23-37 mins   PT G Codes:        2:28 PM, 03-31-2018 Lonell Grandchild, MPT Physical Therapist with [facility] 336 [phone] office [phone] mobile phone

## 2018-03-19 NOTE — [place] FL2 (Signed)
[place] MEDICAID FL2 LEVEL OF CARE SCREENING TOOL     IDENTIFICATION  Patient Name: [name] Birthdate: [birth year] Sex: male Admission Date (Current Location): 03/16/2018  [place] and [place] Number:  Aaron Edelman [id number] S Facility and Address:  [facility],  [address], [place] [address]      Provider Number: [id number]  Attending Physician Name and Address:  Oval Linsey, MD  Relative Name and Phone Number:  Llewellyn Choplin [phone]    Current Level of Care: Hospital Recommended Level of Care: Skilled Nursing Facility Prior Approval Number: [id number]  Date Approved/Denied: 01/17/08 PASRR Number:    Discharge Plan: SNF    Current Diagnoses: Patient Active Problem List   Diagnosis Date Noted  . Bilateral lower leg cellulitis 03/16/2018  . HLD (hyperlipidemia) 03/16/2018  . Obesity, Class III, BMI 40-49.9 (morbid obesity) (HCC) 03/16/2018  . Psoriasis 03/16/2018  . GERD (gastroesophageal reflux disease) 03/16/2018  . Asthma 03/16/2018  . Hypothyroidism 03/16/2018  . ASEPTIC NECROSIS 09/16/2007  . Benign essential HTN 08/17/2007    Orientation RESPIRATION BLADDER Height & Weight     Self, Time, Situation, Place  Normal Continent Weight: 250 lb (113.4 kg) Height:  5\' 6"  (167.6 cm)  BEHAVIORAL SYMPTOMS/MOOD NEUROLOGICAL BOWEL NUTRITION STATUS      Continent Diet(see dc summary)  AMBULATORY STATUS COMMUNICATION OF NEEDS Skin   Independent Verbally Other (Comment)(cellulitis to BLE)                       Personal Care Assistance Level of Assistance  Bathing, Feeding, Dressing Bathing Assistance: Limited assistance Feeding assistance: Independent Dressing Assistance: Limited assistance     Functional Limitations Info  Sight, Hearing, Speech Sight Info: Impaired Hearing Info: Adequate Speech Info: Adequate    SPECIAL CARE FACTORS FREQUENCY                       Contractures Contractures Info:  Not present    Additional Factors Info  Code Status, Allergies Code Status Info: full Allergies Info: No Known Allergies           Current Medications (03/19/2018):  This is the current hospital active medication list Current Facility-Administered Medications  Medication Dose Route Frequency Provider Last Rate Last Dose  . 0.9 %  sodium chloride infusion   Intravenous Continuous Vassie Loll, MD 100 mL/hr at 03/19/18 0951    . acetaminophen (TYLENOL) tablet 650 mg  650 mg Oral Q6H PRN Vassie Loll, MD       Or  . acetaminophen (TYLENOL) suppository 650 mg  650 mg Rectal Q6H PRN Vassie Loll, MD      . albuterol (PROVENTIL) (2.5 MG/3ML) 0.083% nebulizer solution 3 mL  3 mL Inhalation QID PRN Vassie Loll, MD      . amLODipine (NORVASC) tablet 5 mg  5 mg Oral Daily Dondiego, Richard, MD      . atorvastatin (LIPITOR) tablet 40 mg  40 mg Oral Daily Vassie Loll, MD   40 mg at 03/19/18 0951  . cholecalciferol (VITAMIN D) tablet 5,000 Units  5,000 Units Oral Daily Vassie Loll, MD   5,000 Units at 03/19/18 0951  . heparin injection 5,000 Units  5,000 Units Subcutaneous Q8H Vassie Loll, MD   5,000 Units at 03/19/18 0509  . hydrOXYzine (ATARAX/VISTARIL) tablet 25 mg  25 mg Oral Q6H PRN Vassie Loll, MD      . levothyroxine (SYNTHROID, LEVOTHROID) tablet 50 mcg  50 mcg Oral QAC  breakfast Vassie LollMadera, Carlos, MD   50 mcg at 03/19/18 0735  . lisinopril (PRINIVIL,ZESTRIL) tablet 20 mg  20 mg Oral Daily Vassie LollMadera, Carlos, MD   20 mg at 03/19/18 0951  . ondansetron (ZOFRAN) tablet 4 mg  4 mg Oral Q6H PRN Vassie LollMadera, Carlos, MD       Or  . ondansetron [facility](ZOFRAN) injection 4 mg  4 mg Intravenous Q6H PRN Vassie LollMadera, Carlos, MD      . oxyCODONE (Oxy IR/ROXICODONE) immediate release tablet 5 mg  5 mg Oral Q6H PRN Vassie LollMadera, Carlos, MD   5 mg at 03/19/18 1058  . pantoprazole (PROTONIX) EC tablet 40 mg  40 mg Oral Daily Vassie LollMadera, Carlos, MD   40 mg at 03/19/18 0951  . piperacillin-tazobactam (ZOSYN) IVPB 3.375 g   3.375 g Intravenous Myriam JacobsonQ8H Madera, Carlos, MD 12.5 mL/hr at 03/19/18 0951 3.375 g at 03/19/18 0951  . potassium chloride SA (K-DUR,KLOR-CON) CR tablet 20 mEq  20 mEq Oral Daily Dondiego, Richard, MD      . vancomycin (VANCOCIN) IVPB 1000 mg/200 mL premix  1,000 mg Intravenous Q12H Vassie LollMadera, Carlos, MD   Stopped at 03/19/18 1051     Discharge Medications: Please see discharge summary for a list of discharge medications.  Relevant Imaging Results:  Relevant Lab Results:   Additional Information SSN: [id number]  Elliot GaultKathleen Jaylinn Hellenbrand, [place]

## 2018-03-21 LAB — CULTURE, BLOOD (ROUTINE X 2)
CULTURE: NO GROWTH
CULTURE: NO GROWTH
SPECIAL REQUESTS: ADEQUATE
Special Requests: ADEQUATE

## 2018-09-10 ENCOUNTER — Other Ambulatory Visit (HOSPITAL_COMMUNITY): Payer: Self-pay | Admitting: Family Medicine

## 2018-09-10 DIAGNOSIS — M5442 Lumbago with sciatica, left side: Secondary | ICD-10-CM

## 2018-09-15 ENCOUNTER — Ambulatory Visit (HOSPITAL_COMMUNITY)
Admission: RE | Admit: 2018-09-15 | Discharge: 2018-09-15 | Disposition: A | Discharge status: Home / Self Care | Payer: Medicaid Other | Source: Ambulatory Visit | Attending: Family Medicine | Admitting: Family Medicine

## 2018-09-15 DIAGNOSIS — M5442 Lumbago with sciatica, left side: Secondary | ICD-10-CM | POA: Insufficient documentation

## 2018-09-25 ENCOUNTER — Encounter (HOSPITAL_COMMUNITY): Payer: Self-pay | Admitting: Emergency Medicine

## 2018-09-25 ENCOUNTER — Emergency Department (HOSPITAL_COMMUNITY)
Admission: EM | Admit: 2018-09-25 | Discharge: 2018-09-26 | Disposition: A | Discharge status: Home / Self Care | Payer: Medicaid Other | Attending: Emergency Medicine | Admitting: Emergency Medicine

## 2018-09-25 ENCOUNTER — Other Ambulatory Visit: Payer: Self-pay

## 2018-09-25 ENCOUNTER — Emergency Department (HOSPITAL_COMMUNITY): Payer: Medicaid Other

## 2018-09-25 DIAGNOSIS — Z8673 Personal history of transient ischemic attack (TIA), and cerebral infarction without residual deficits: Secondary | ICD-10-CM | POA: Insufficient documentation

## 2018-09-25 DIAGNOSIS — E039 Hypothyroidism, unspecified: Secondary | ICD-10-CM | POA: Insufficient documentation

## 2018-09-25 DIAGNOSIS — F17210 Nicotine dependence, cigarettes, uncomplicated: Secondary | ICD-10-CM | POA: Insufficient documentation

## 2018-09-25 DIAGNOSIS — R200 Anesthesia of skin: Secondary | ICD-10-CM

## 2018-09-25 DIAGNOSIS — Z79899 Other long term (current) drug therapy: Secondary | ICD-10-CM | POA: Insufficient documentation

## 2018-09-25 DIAGNOSIS — J45909 Unspecified asthma, uncomplicated: Secondary | ICD-10-CM | POA: Insufficient documentation

## 2018-09-25 LAB — CBC
HEMATOCRIT: 46.2 % (ref 39.0–52.0)
Hemoglobin: 15 g/dL (ref 13.0–17.0)
MCH: 30.4 pg (ref 26.0–34.0)
MCHC: 32.5 g/dL (ref 30.0–36.0)
MCV: 93.7 fL (ref 80.0–100.0)
NRBC: 0 % (ref 0.0–0.2)
Platelets: 308 10*3/uL (ref 150–400)
RBC: 4.93 MIL/uL (ref 4.22–5.81)
RDW: 12.2 % (ref 11.5–15.5)
WBC: 10.2 10*3/uL (ref 4.0–10.5)

## 2018-09-25 LAB — DIFFERENTIAL
ABS IMMATURE GRANULOCYTES: 0.03 10*3/uL (ref 0.00–0.07)
BASOS ABS: 0.1 10*3/uL (ref 0.0–0.1)
Basophils Relative: 1 %
Eosinophils Absolute: 0.3 10*3/uL (ref 0.0–0.5)
Eosinophils Relative: 3 %
IMMATURE GRANULOCYTES: 0 %
LYMPHS ABS: 2.3 10*3/uL (ref 0.7–4.0)
LYMPHS PCT: 23 %
Monocytes Absolute: 0.8 10*3/uL (ref 0.1–1.0)
Monocytes Relative: 8 %
Neutro Abs: 6.6 10*3/uL (ref 1.7–7.7)
Neutrophils Relative %: 65 %

## 2018-09-25 LAB — COMPREHENSIVE METABOLIC PANEL
ALT: 33 U/L (ref 0–44)
AST: 29 U/L (ref 15–41)
Albumin: 3.8 g/dL (ref 3.5–5.0)
Alkaline Phosphatase: 134 U/L — ABNORMAL HIGH (ref 38–126)
Anion gap: 8 (ref 5–15)
BUN: 5 mg/dL — ABNORMAL LOW (ref 6–20)
CO2: 29 mmol/L (ref 22–32)
Calcium: 9.2 mg/dL (ref 8.9–10.3)
Chloride: 99 mmol/L (ref 98–111)
Creatinine, Ser: 0.82 mg/dL (ref 0.61–1.24)
GFR calc Af Amer: >60 mL/min (ref >60)
GFR calc non Af Amer: >60 mL/min (ref >60)
Glucose, Bld: 152 mg/dL — ABNORMAL HIGH (ref 70–99)
Potassium: 3.7 mmol/L (ref 3.5–5.1)
Sodium: 136 mmol/L (ref 135–145)
TOTAL PROTEIN: 7.3 g/dL (ref 6.5–8.1)
Total Bilirubin: 0.6 mg/dL (ref 0.3–1.2)

## 2018-09-25 LAB — URINALYSIS, ROUTINE W REFLEX MICROSCOPIC
Bilirubin Urine: NEGATIVE
Glucose, UA: NEGATIVE mg/dL
Hgb urine dipstick: NEGATIVE
Ketones, ur: NEGATIVE mg/dL
LEUKOCYTES UA: NEGATIVE
Nitrite: NEGATIVE
Protein, ur: NEGATIVE mg/dL
Specific Gravity, Urine: 1.005 (ref 1.005–1.030)
pH: 7 (ref 5.0–8.0)

## 2018-09-25 LAB — RAPID URINE DRUG SCREEN, HOSP PERFORMED
Amphetamines: NOT DETECTED
Barbiturates: NOT DETECTED
Benzodiazepines: NOT DETECTED
Cocaine: NOT DETECTED
OPIATES: POSITIVE — AB
TETRAHYDROCANNABINOL: NOT DETECTED

## 2018-09-25 LAB — PROTIME-INR
INR: 1.05
Prothrombin Time: 13.6 seconds (ref 11.4–15.2)

## 2018-09-25 LAB — APTT: aPTT: 34 seconds (ref 24–36)

## 2018-09-25 MED ORDER — SODIUM CHLORIDE 0.9 % IV SOLN: 100.0000 mL/h | INTRAVENOUS | Status: DC

## 2018-09-25 MED ORDER — ASPIRIN EC 325 MG PO TBEC: 325.0000 mg | DELAYED_RELEASE_TABLET | Freq: Every day | ORAL | 0 refills | Status: DC

## 2018-09-25 MED ORDER — SODIUM CHLORIDE 0.9 % IV BOLUS
500.0000 mL | Freq: Once | INTRAVENOUS | Status: AC
  Administered 2018-09-25: 500 mL via INTRAVENOUS

## 2018-09-25 NOTE — ED Notes (Signed)
Dr L has evaluated pt for tia

## 2018-09-25 NOTE — Discharge Instructions (Signed)
As discussed, your evaluation today has been largely reassuring.  But, it is important that you monitor your condition carefully, and do not hesitate to return to the ED if you develop new, or concerning changes in your condition. ? ?Otherwise, please follow-up with your physician for appropriate ongoing care. ? ?

## 2018-09-25 NOTE — ED Notes (Signed)
Patient transported to CT 

## 2018-09-25 NOTE — ED Provider Notes (Signed)
[facility] EMERGENCY DEPARTMENT Provider Note   CSN: [id number] Arrival date & time: 09/25/18  2122     History   Chief Complaint Chief Complaint  Patient presents with  . Numbness    HPI [name] is a 52 y.o. male.  HPI Patient presents after an episode of numbness. Patient states that he is essentially feeling good, to use his words, but earlier today, within the past 5 hours the patient had numbness in his right arm, right chest. No clear precipitant, no clear alleviating or exacerbating factors for the symptoms which lasted several moments. He denies recent change in medication, diet, activity. He denies any ongoing discomfort, dyspnea, speech difficulty.  Past Medical History:  Diagnosis Date  . Acid reflux disease   . Arthritis   . Asthma   . Gout   . Headache   . High blood pressure   . High cholesterol   . Psoriasis     Patient Active Problem List   Diagnosis Date Noted  . Bilateral lower leg cellulitis 03/16/2018  . HLD (hyperlipidemia) 03/16/2018  . Obesity, Class III, BMI 40-49.9 (morbid obesity) (HCC) 03/16/2018  . Psoriasis 03/16/2018  . GERD (gastroesophageal reflux disease) 03/16/2018  . Asthma 03/16/2018  . Hypothyroidism 03/16/2018  . ASEPTIC NECROSIS 09/16/2007  . Benign essential HTN 08/17/2007    Past Surgical History:  Procedure Laterality Date  . JOINT REPLACEMENT     two total hip replacements  . MULTIPLE EXTRACTIONS WITH ALVEOLOPLASTY N/A 06/19/2017   Procedure: MULTIPLE EXTRACTION WITH ALVEOLOPLASTY;  Surgeon: Ocie Doyne, DDS;  Location: [facility] OR;  Service: Oral Surgery;  Laterality: N/A;  . SPINAL FUSION    . TOTAL HIP ARTHROPLASTY          Home Medications    Prior to Admission medications   Medication Sig Start Date End Date Taking? Authorizing Provider  albuterol (PROVENTIL HFA;VENTOLIN HFA) 108 (90 BASE) MCG/ACT inhaler Inhale 2 puffs into the lungs 4 (four) times daily as needed for wheezing or shortness of  breath.     [provider]  albuterol (PROVENTIL) (2.5 MG/3ML) 0.083% nebulizer solution Inhale 3 mLs into the lungs 4 (four) times daily as needed for wheezing or shortness of breath. 03/19/18   Oval Linsey, MD  amLODipine (NORVASC) 5 MG tablet Take 1 tablet (5 mg total) by mouth daily. 03/20/18   Oval Linsey, MD  atorvastatin (LIPITOR) 40 MG tablet Take 40 mg by mouth daily.    [provider]  cholecalciferol (VITAMIN D) 1000 units tablet Take 5,000 Units by mouth daily.    [provider]  hydrOXYzine (ATARAX/VISTARIL) 25 MG tablet Take 1 tablet (25 mg total) by mouth every 6 (six) hours as needed for itching. 03/15/15   Triplett, Tammy, PA-C  levothyroxine (SYNTHROID, LEVOTHROID) 50 MCG tablet Take 50 mcg by mouth daily before breakfast.    [provider]  lisinopril (PRINIVIL,ZESTRIL) 20 MG tablet Take 20 mg by mouth daily.    [provider]  naproxen (NAPROSYN) 500 MG tablet Take 500 mg by mouth daily.     [provider]  omeprazole (PRILOSEC) 20 MG capsule Take 20 mg by mouth daily as needed (for acid reflux).     [provider]  oxyCODONE (OXY IR/ROXICODONE) 5 MG immediate release tablet Take 5 mg by mouth every 6 (six) hours as needed for moderate pain.    [provider]  potassium chloride SA (K-DUR,KLOR-CON) 20 MEQ tablet Take 1 tablet (20 mEq total) by  mouth daily. 03/20/18   Oval Linseyondiego, Richard, MD  Skin Protectants, Misc. (EUCERIN) cream Apply 1 application topically as needed for dry skin.    [provider]  vancomycin (VANCOCIN) 1-5 GM/200ML-% SOLN Inject 200 mLs (1,000 mg total) into the vein every 12 (twelve) hours. 03/19/18   Oval Linseyondiego, Richard, MD    Family History Family History  Problem Relation Age of Onset  . Diabetes Other     Social History Social History   Tobacco Use  . Smoking status: Current Every Day Smoker    Packs/day: 1.00    Years: 20.00    Pack years: 20.00     Types: Cigarettes  . Smokeless tobacco: Never Used  . Tobacco comment: patient states he smokes daily but only socially   Substance Use Topics  . Alcohol use: Yes    Comment: occasionally  . Drug use: No     Allergies   Patient has no known allergies.   Review of Systems Review of Systems  Constitutional:       Per HPI, otherwise negative  HENT:       Per HPI, otherwise negative  Respiratory:       Per HPI, otherwise negative  Cardiovascular:       Per HPI, otherwise negative  Gastrointestinal: Negative for vomiting.  Endocrine:       Negative aside from HPI  Genitourinary:       Neg aside from HPI   Musculoskeletal:       Per HPI, otherwise negative  Skin: Negative.   Neurological: Positive for numbness. Negative for syncope.     Physical Exam Updated Vital Signs BP (!) 163/107   Pulse 98   Temp 97.9 F (36.6 C) (Oral)   Resp 12   Ht 5\' 6"  (1.676 m)   Wt 113.4 kg   SpO2 99%   BMI 40.35 kg/m   Physical Exam Vitals signs and nursing note reviewed.  Constitutional:      General: He is not in acute distress.    Appearance: He is well-developed.  HENT:     Head: Normocephalic and atraumatic.  Eyes:     Conjunctiva/sclera: Conjunctivae normal.  Cardiovascular:     Rate and Rhythm: Normal rate and regular rhythm.  Pulmonary:     Effort: Pulmonary effort is normal. No respiratory distress.     Breath sounds: No stridor.  Abdominal:     General: There is no distension.  Skin:    General: Skin is warm and dry.  Neurological:     Mental Status: He is alert and oriented to person, place, and time.     Cranial Nerves: No cranial nerve deficit.     Motor: No weakness, tremor, atrophy, abnormal muscle tone or seizure activity.     Coordination: Coordination normal.      ED Treatments / Results  Labs (all labs ordered are listed, but only abnormal results are displayed) Labs Reviewed  COMPREHENSIVE METABOLIC PANEL - Abnormal; Notable for the following  components:      Result Value   Glucose, Bld 152 (*)    BUN 5 (*)    Alkaline Phosphatase 134 (*)    All other components within normal limits  RAPID URINE DRUG SCREEN, HOSP PERFORMED - Abnormal; Notable for the following components:   Opiates POSITIVE (*)    All other components within normal limits  URINALYSIS, ROUTINE W REFLEX MICROSCOPIC - Abnormal; Notable for the following components:   Color, Urine STRAW (*)  All other components within normal limits  PROTIME-INR  APTT  CBC  DIFFERENTIAL  I-STAT TROPONIN, ED    EKG None  Radiology Ct Head Wo Contrast  Result Date: 09/25/2018 CLINICAL DATA:  Acute onset of right arm numbness. EXAM: CT HEAD WITHOUT CONTRAST TECHNIQUE: Contiguous axial images were obtained from the base of the skull through the vertex without intravenous contrast. COMPARISON:  MRI of the brain performed 11/26/2016 FINDINGS: Brain: No evidence of acute infarction, hemorrhage, hydrocephalus, extra-axial collection or mass lesion/mass effect. Chronic ischemic change is noted at the right mid corona radiata and right basal ganglia, apparently new from 2018. The posterior fossa, including the cerebellum, brainstem and fourth ventricle, is within normal limits. The third and lateral ventricles are unremarkable in appearance. The cerebral hemispheres demonstrate grossly normal gray-white differentiation. No mass effect or midline shift is seen. Vascular: No hyperdense vessel or unexpected calcification. Skull: There is no evidence of fracture; visualized osseous structures are unremarkable in appearance. Sinuses/Orbits: The orbits are within normal limits. The paranasal sinuses and mastoid air cells are well-aerated. Other: No significant soft tissue abnormalities are seen. IMPRESSION: 1. No acute intracranial pathology seen on CT. 2. Chronic ischemic change at the right mid corona radiata and right basal ganglia, apparently new from 2018. Electronically Signed   By: Roanna RaiderJeffery   Chang M.D.   On: 09/25/2018 22:48    Procedures Procedures (including critical care time)  Medications Ordered in ED Medications  sodium chloride 0.9 % bolus 500 mL (500 mLs Intravenous New Bag/Given 09/25/18 2346)    Followed by  0.9 %  sodium chloride infusion (has no administration in time range)     Initial Impression / Assessment and Plan / ED Course  I have reviewed the triage vital signs and the nursing notes.  Pertinent labs & imaging results that were available during my care of the patient were reviewed by me and considered in my medical decision making (see chart for details).     11:55 PM Patient in no distress, has had no recurrence of his numbness. We discussed all findings at length including CT demonstrating possible stroke within the past few years. Here, with no ongoing symptoms, no evidence for acute new stroke. TIA considered, but also less likely however, given his history of stroke, likely on CT, patient will start full-strength aspirin, will follow-up with neurology for consideration additional imaging, evaluation and monitoring.   Final Clinical Impressions(s) / ED Diagnoses  numbness   Gerhard MunchLockwood, Althea Backs, MD 09/25/18 2356

## 2018-09-25 NOTE — ED Notes (Signed)
Pt returned from xray

## 2018-09-25 NOTE — ED Triage Notes (Signed)
Numbness to R arm x 70 minutes  Reports has happened in the past but now has  Feels lip is "feeling knind of numb" kind of stays like that all the time"  Here for eval   Reports it may be where he drank milk too fast

## 2018-09-26 LAB — I-STAT TROPONIN, ED: Troponin i, poc: 0.01 ng/mL (ref 0.00–0.08)

## 2019-11-29 ENCOUNTER — Other Ambulatory Visit: Payer: Self-pay

## 2019-11-29 ENCOUNTER — Emergency Department (HOSPITAL_COMMUNITY): Payer: Medicaid Other

## 2019-11-29 ENCOUNTER — Emergency Department (HOSPITAL_COMMUNITY)
Admission: EM | Admit: 2019-11-29 | Discharge: 2019-11-29 | Disposition: A | Discharge status: Home / Self Care | Payer: Medicaid Other | Attending: Emergency Medicine | Admitting: Emergency Medicine

## 2019-11-29 ENCOUNTER — Encounter (HOSPITAL_COMMUNITY): Payer: Self-pay | Admitting: Emergency Medicine

## 2019-11-29 DIAGNOSIS — Z7982 Long term (current) use of aspirin: Secondary | ICD-10-CM | POA: Insufficient documentation

## 2019-11-29 DIAGNOSIS — R52 Pain, unspecified: Secondary | ICD-10-CM

## 2019-11-29 DIAGNOSIS — Z79899 Other long term (current) drug therapy: Secondary | ICD-10-CM | POA: Insufficient documentation

## 2019-11-29 DIAGNOSIS — F17210 Nicotine dependence, cigarettes, uncomplicated: Secondary | ICD-10-CM | POA: Diagnosis not present

## 2019-11-29 DIAGNOSIS — M25562 Pain in left knee: Secondary | ICD-10-CM | POA: Insufficient documentation

## 2019-11-29 DIAGNOSIS — M7989 Other specified soft tissue disorders: Secondary | ICD-10-CM

## 2019-11-29 DIAGNOSIS — R2242 Localized swelling, mass and lump, left lower limb: Secondary | ICD-10-CM | POA: Insufficient documentation

## 2019-11-29 DIAGNOSIS — E039 Hypothyroidism, unspecified: Secondary | ICD-10-CM | POA: Diagnosis not present

## 2019-11-29 DIAGNOSIS — Z96649 Presence of unspecified artificial hip joint: Secondary | ICD-10-CM | POA: Insufficient documentation

## 2019-11-29 DIAGNOSIS — I10 Essential (primary) hypertension: Secondary | ICD-10-CM | POA: Insufficient documentation

## 2019-11-29 DIAGNOSIS — J45909 Unspecified asthma, uncomplicated: Secondary | ICD-10-CM | POA: Diagnosis not present

## 2019-11-29 DIAGNOSIS — M79605 Pain in left leg: Secondary | ICD-10-CM | POA: Diagnosis present

## 2019-11-29 LAB — COMPREHENSIVE METABOLIC PANEL
ALT: 17 U/L (ref 0–44)
AST: 18 U/L (ref 15–41)
Albumin: 3.1 g/dL — ABNORMAL LOW (ref 3.5–5.0)
Alkaline Phosphatase: 113 U/L (ref 38–126)
Anion gap: 7 (ref 5–15)
BUN: 7 mg/dL (ref 6–20)
CO2: 27 mmol/L (ref 22–32)
Calcium: 8.5 mg/dL — ABNORMAL LOW (ref 8.9–10.3)
Chloride: 100 mmol/L (ref 98–111)
Creatinine, Ser: 0.65 mg/dL (ref 0.61–1.24)
GFR calc Af Amer: >60 mL/min (ref >60)
GFR calc non Af Amer: >60 mL/min (ref >60)
Glucose, Bld: 145 mg/dL — ABNORMAL HIGH (ref 70–99)
Potassium: 3.4 mmol/L — ABNORMAL LOW (ref 3.5–5.1)
Sodium: 134 mmol/L — ABNORMAL LOW (ref 135–145)
Total Bilirubin: 0.5 mg/dL (ref 0.3–1.2)
Total Protein: 7 g/dL (ref 6.5–8.1)

## 2019-11-29 LAB — CBC WITH DIFFERENTIAL/PLATELET
Abs Immature Granulocytes: 0.04 10*3/uL (ref 0.00–0.07)
Basophils Absolute: 0.1 10*3/uL (ref 0.0–0.1)
Basophils Relative: 1 %
Eosinophils Absolute: 0.4 10*3/uL (ref 0.0–0.5)
Eosinophils Relative: 4 %
HCT: 40.6 % (ref 39.0–52.0)
Hemoglobin: 13.4 g/dL (ref 13.0–17.0)
Immature Granulocytes: 0 %
Lymphocytes Relative: 19 %
Lymphs Abs: 1.8 10*3/uL (ref 0.7–4.0)
MCH: 30.7 pg (ref 26.0–34.0)
MCHC: 33 g/dL (ref 30.0–36.0)
MCV: 93.1 fL (ref 80.0–100.0)
Monocytes Absolute: 1.1 10*3/uL — ABNORMAL HIGH (ref 0.1–1.0)
Monocytes Relative: 11 %
Neutro Abs: 5.9 10*3/uL (ref 1.7–7.7)
Neutrophils Relative %: 65 %
Platelets: 387 10*3/uL (ref 150–400)
RBC: 4.36 MIL/uL (ref 4.22–5.81)
RDW: 13 % (ref 11.5–15.5)
WBC: 9.2 10*3/uL (ref 4.0–10.5)
nRBC: 0 % (ref 0.0–0.2)

## 2019-11-29 NOTE — ED Provider Notes (Signed)
[facility] EMERGENCY DEPARTMENT Provider Note   CSN: [id number] Arrival date & time: 11/29/19  1533     History Chief Complaint  Patient presents with  . Knee Pain    [name] is a 54 y.o. male.  HPI He presents for evaluation of pain and swelling, in the left leg.  Onset of symptoms 3 days ago.  He is also noted a popping sensation in his left knee, when he stands and walks.  No specific trauma.  No similar problems in the past.  No history of DVT.  He has multiple medical problems, but is unable to specify which medications he takes for them.  He denies headache, chest pain, shortness of breath, weakness or dizziness.  There are no other known modifying factors.    Past Medical History:  Diagnosis Date  . Acid reflux disease   . Arthritis   . Asthma   . Gout   . Headache   . High blood pressure   . High cholesterol   . Psoriasis     Patient Active Problem List   Diagnosis Date Noted  . Bilateral lower leg cellulitis 03/16/2018  . HLD (hyperlipidemia) 03/16/2018  . Obesity, Class III, BMI 40-49.9 (morbid obesity) (HCC) 03/16/2018  . Psoriasis 03/16/2018  . GERD (gastroesophageal reflux disease) 03/16/2018  . Asthma 03/16/2018  . Hypothyroidism 03/16/2018  . ASEPTIC NECROSIS 09/16/2007  . Benign essential HTN 08/17/2007    Past Surgical History:  Procedure Laterality Date  . JOINT REPLACEMENT     two total hip replacements  . MULTIPLE EXTRACTIONS WITH ALVEOLOPLASTY N/A 06/19/2017   Procedure: MULTIPLE EXTRACTION WITH ALVEOLOPLASTY;  Surgeon: Ocie Doyne, DDS;  Location: [facility] OR;  Service: Oral Surgery;  Laterality: N/A;  . SPINAL FUSION    . TOTAL HIP ARTHROPLASTY         Family History  Problem Relation Age of Onset  . Diabetes Other     Social History   Tobacco Use  . Smoking status: Current Every Day Smoker    Packs/day: 1.00    Years: 20.00    Pack years: 20.00    Types: Cigarettes  . Smokeless tobacco: Never Used  . Tobacco comment:  patient states he smokes daily but only socially   Substance Use Topics  . Alcohol use: Yes    Comment: occasionally  . Drug use: No    Home Medications Prior to Admission medications   Medication Sig Start Date End Date Taking? Authorizing Provider  albuterol (PROVENTIL HFA;VENTOLIN HFA) 108 (90 BASE) MCG/ACT inhaler Inhale 2 puffs into the lungs 4 (four) times daily as needed for wheezing or shortness of breath.     [provider]  albuterol (PROVENTIL) (2.5 MG/3ML) 0.083% nebulizer solution Inhale 3 mLs into the lungs 4 (four) times daily as needed for wheezing or shortness of breath. 03/19/18   Oval Linsey, MD  amLODipine (NORVASC) 5 MG tablet Take 1 tablet (5 mg total) by mouth daily. 03/20/18   Oval Linsey, MD  aspirin EC 325 MG tablet Take 1 tablet (325 mg total) by mouth daily. 09/25/18   Gerhard Munch, MD  atorvastatin (LIPITOR) 40 MG tablet Take 40 mg by mouth daily.    [provider]  cholecalciferol (VITAMIN D) 1000 units tablet Take 5,000 Units by mouth daily.    [provider]  hydrOXYzine (ATARAX/VISTARIL) 25 MG tablet Take 1 tablet (25 mg total) by mouth every 6 (six) hours as needed for itching. 03/15/15   Triplett, Babette Relic,  PA-C  levothyroxine (SYNTHROID, LEVOTHROID) 50 MCG tablet Take 50 mcg by mouth daily before breakfast.    [provider]  lisinopril (PRINIVIL,ZESTRIL) 20 MG tablet Take 20 mg by mouth daily.    [provider]  naproxen (NAPROSYN) 500 MG tablet Take 500 mg by mouth daily.     [provider]  omeprazole (PRILOSEC) 20 MG capsule Take 20 mg by mouth daily as needed (for acid reflux).     [provider]  oxyCODONE (OXY IR/ROXICODONE) 5 MG immediate release tablet Take 5 mg by mouth every 6 (six) hours as needed for moderate pain.    [provider]  potassium chloride SA (K-DUR,KLOR-CON) 20 MEQ tablet Take 1 tablet (20 mEq total) by mouth daily. 03/20/18   Oval Linsey,  MD  Skin Protectants, Misc. (EUCERIN) cream Apply 1 application topically as needed for dry skin.    [provider]    Allergies    Patient has no known allergies.  Review of Systems   Review of Systems  All other systems reviewed and are negative.   Physical Exam Updated Vital Signs BP (!) 146/88 (BP Location: Right Arm)   Pulse 93   Temp 98.8 F (37.1 C) (Oral)   Resp 16   Ht 5\' 9"  (1.753 m)   Wt 115.7 kg   SpO2 96%   BMI 37.66 kg/m   Physical Exam Vitals and nursing note reviewed.  Constitutional:      General: He is not in acute distress.    Appearance: He is well-developed. He is obese. He is not ill-appearing, toxic-appearing or diaphoretic.  HENT:     Head: Normocephalic and atraumatic.     Right Ear: External ear normal.     Left Ear: External ear normal.  Eyes:     Conjunctiva/sclera: Conjunctivae normal.     Pupils: Pupils are equal, round, and reactive to light.  Neck:     Trachea: Phonation normal.  Cardiovascular:     Rate and Rhythm: Normal rate and regular rhythm.     Heart sounds: Normal heart sounds.  Pulmonary:     Effort: Pulmonary effort is normal. No respiratory distress.     Breath sounds: Normal breath sounds. No stridor.  Abdominal:     General: There is no distension.     Palpations: Abdomen is soft.     Tenderness: There is no abdominal tenderness.  Musculoskeletal:        General: Normal range of motion.     Cervical back: Normal range of motion and neck supple.     Comments: Left leg tender and swollen from the distal thigh area to the toes.  Slight redness, immediately just above the root left knee.  Mild tenderness of the left calf.  Neurovascular intact distally in the toes of the left foot.  He is able to extend the left leg off the stretcher.  He can flex the left knee, about 40 degrees without significant pain.  Skin:    General: Skin is warm and dry.  Neurological:     Mental Status: He is alert and oriented to  person, place, and time.     Cranial Nerves: No cranial nerve deficit.     Sensory: No sensory deficit.     Motor: No abnormal muscle tone.     Coordination: Coordination normal.  Psychiatric:        Mood and Affect: Mood normal.        Behavior: Behavior normal.  Thought Content: Thought content normal.        Judgment: Judgment normal.     ED Results / Procedures / Treatments   Labs (all labs ordered are listed, but only abnormal results are displayed) Labs Reviewed  COMPREHENSIVE METABOLIC PANEL - Abnormal; Notable for the following components:      Result Value   Sodium 134 (*)    Potassium 3.4 (*)    Glucose, Bld 145 (*)    Calcium 8.5 (*)    Albumin 3.1 (*)    All other components within normal limits  CBC WITH DIFFERENTIAL/PLATELET - Abnormal; Notable for the following components:   Monocytes Absolute 1.1 (*)    All other components within normal limits    EKG None  Radiology US Venous Img Lower Unilateral Left (DVT)  Result Date: 11/29/2019 CLINICAL DATA:  53 year old male with left lower extremity pain. EXAM: Left LOWER EXTREMITY VENOUS DOPPLER ULTRASOUND TECHNIQUE: Gray-scale sonography with compression, as well as color and duplex ultrasound, were performed to evaluate the deep venous system(s) from the level of the common femoral vein through the popliteal and proximal calf veins. COMPARISON:  None. FINDINGS: VENOUS Normal compressibility of the common femoral, superficial femoral, and popliteal veins, as well as the visualized calf veins. Visualized portions of profunda femoral vein and great saphenous vein unremarkable. No filling defects to suggest DVT on grayscale or color Doppler imaging. Doppler waveforms show normal direction of venous flow, normal respiratory phasicity and response to augmentation. Limited views of the contralateral common femoral vein are unremarkable. OTHER There is mild subcutaneous edema of the distal leg and ankle. Limitations: none  IMPRESSION: No femoropopliteal DVT nor evidence of DVT within the visualized calf veins. If clinical symptoms are inconsistent or if there are persistent or worsening symptoms, further imaging (possibly involving the iliac veins) may be warranted. Electronically Signed   By: Anner Crete M.D.   On: 11/29/2019 17:21   DG Knee Complete 4 Views Left  Result Date: 11/29/2019 CLINICAL DATA:  54 year old male with left knee pain. EXAM: LEFT KNEE - COMPLETE 4+ VIEW COMPARISON:  None. FINDINGS: There is no acute fracture or dislocation. The bones are osteopenic. No significant arthritic changes. The there is a small to moderate suprapatellar effusion. The soft tissues are grossly unremarkable. IMPRESSION: 1. No acute fracture or dislocation. 2. Small to moderate suprapatellar effusion. Electronically Signed   By: Anner Crete M.D.   On: 11/29/2019 16:21    Procedures Procedures (including critical care time)  Medications Ordered in ED Medications - No data to display  ED Course  I have reviewed the triage vital signs and the nursing notes.  Pertinent labs & imaging results that were available during my care of the patient were reviewed by me and considered in my medical decision making (see chart for details).  Clinical Course as of Nov 28 1899  Tue Nov 29, 2019  1852 No dislocation or fracture or effusion, interpreted  DG Knee Complete 4 Views Left [EW]  1855 Ace wrap on left knee, and knee immobilizer applied left knee, per nursing.   [EW]    Clinical Course User Index [EW] Daleen Bo, MD   MDM Rules/Calculators/A&P                       Patient Vitals for the past 24 hrs:  BP Temp Temp src Pulse Resp SpO2 Height Weight  11/29/19 1827 (!) 146/88 98.8 F (37.1 C) Oral 93 16 96 % -- --  11/29/19 1544 -- -- -- -- -- -- 5\' 9"  (1.753 m) 115.7 kg  11/29/19 1541 (!) 150/89 98.3 F (36.8 C) Oral 96 18 100 % -- --    6:53 PM Reevaluation with update and discussion. After initial  assessment and treatment, an updated evaluation reveals no change in clinical status.  Findings discussed with the patient all questions were answered. 01/29/20   Medical Decision Making: Left knee pain and left leg swelling.  No evidence for fracture or DVT.  I suspect that this is a primary left knee problem secondary left leg swelling due to inactivity.  Patient treated with Ace wrap and knee immobilizer, left leg for support.  He will be referred to orthopedics and use his current narcotic pain reliever for pain control.  [name] was evaluated in Emergency Department on 11/29/2019 for the symptoms described in the history of present illness. He was evaluated in the context of the global COVID-19 pandemic, which necessitated consideration that the patient might be at risk for infection with the SARS-CoV-2 virus that causes COVID-19. Institutional protocols and algorithms that pertain to the evaluation of patients at risk for COVID-19 are in a state of rapid change based on information released by regulatory bodies including the CDC and federal and state organizations. These policies and algorithms were followed during the patient's care in the ED.   CRITICAL CARE-no Performed by: 01/29/2020   Nursing Notes Reviewed/ Care Coordinated Applicable Imaging Reviewed Interpretation of Laboratory Data incorporated into ED treatment  The patient appears reasonably screened and/or stabilized for discharge and I doubt any other medical condition or other [facility] requiring further screening, evaluation, or treatment in the ED at this time prior to discharge.  Plan: Home Medications-continue usual; Home Treatments-knee immobilizer and Ace wrap as needed; return here if the recommended treatment, does not improve the symptoms; Recommended follow up-orthopedic follow-up 1 week regarding left leg swelling and left knee pain.    Final Clinical Impression(s) / ED Diagnoses Final diagnoses:  Acute  pain of left knee  Left leg swelling    Rx / DC Orders ED Discharge Orders    None       [facility], MD 11/29/19 1901

## 2019-11-29 NOTE — ED Triage Notes (Signed)
LT knee and ankle pain x 3 days.  Pt states he stood up and heard something pop and has been in pain since

## 2019-11-29 NOTE — Discharge Instructions (Addendum)
Use the knee immobilizer, when you need to walk.  Use the Ace wrap, to help support the knee.  Try to keep your left leg elevated above your heart is much as possible.  Call the orthopedic doctor for follow-up appointment as soon as possible.

## 2020-02-23 ENCOUNTER — Encounter (HOSPITAL_COMMUNITY): Payer: Self-pay

## 2020-02-23 ENCOUNTER — Other Ambulatory Visit: Payer: Self-pay

## 2020-02-23 ENCOUNTER — Emergency Department (HOSPITAL_COMMUNITY)
Admission: EM | Admit: 2020-02-23 | Discharge: 2020-02-23 | Disposition: A | Discharge status: Home / Self Care | Payer: Medicaid Other | Attending: Emergency Medicine | Admitting: Emergency Medicine

## 2020-02-23 ENCOUNTER — Emergency Department (HOSPITAL_COMMUNITY): Payer: Medicaid Other

## 2020-02-23 DIAGNOSIS — I10 Essential (primary) hypertension: Secondary | ICD-10-CM | POA: Insufficient documentation

## 2020-02-23 DIAGNOSIS — F17210 Nicotine dependence, cigarettes, uncomplicated: Secondary | ICD-10-CM | POA: Diagnosis not present

## 2020-02-23 DIAGNOSIS — E119 Type 2 diabetes mellitus without complications: Secondary | ICD-10-CM | POA: Diagnosis not present

## 2020-02-23 DIAGNOSIS — M109 Gout, unspecified: Secondary | ICD-10-CM | POA: Insufficient documentation

## 2020-02-23 DIAGNOSIS — Z7982 Long term (current) use of aspirin: Secondary | ICD-10-CM | POA: Diagnosis not present

## 2020-02-23 DIAGNOSIS — Z79899 Other long term (current) drug therapy: Secondary | ICD-10-CM | POA: Diagnosis not present

## 2020-02-23 DIAGNOSIS — M25571 Pain in right ankle and joints of right foot: Secondary | ICD-10-CM | POA: Diagnosis present

## 2020-02-23 DIAGNOSIS — M79671 Pain in right foot: Secondary | ICD-10-CM

## 2020-02-23 LAB — CBC WITH DIFFERENTIAL/PLATELET
Abs Immature Granulocytes: 0.06 10*3/uL (ref 0.00–0.07)
Basophils Absolute: 0.1 10*3/uL (ref 0.0–0.1)
Basophils Relative: 1 %
Eosinophils Absolute: 0.1 10*3/uL (ref 0.0–0.5)
Eosinophils Relative: 1 %
HCT: 45 % (ref 39.0–52.0)
Hemoglobin: 15.2 g/dL (ref 13.0–17.0)
Immature Granulocytes: 0 %
Lymphocytes Relative: 13 %
Lymphs Abs: 1.7 10*3/uL (ref 0.7–4.0)
MCH: 31.2 pg (ref 26.0–34.0)
MCHC: 33.8 g/dL (ref 30.0–36.0)
MCV: 92.4 fL (ref 80.0–100.0)
Monocytes Absolute: 1.5 10*3/uL — ABNORMAL HIGH (ref 0.1–1.0)
Monocytes Relative: 11 %
Neutro Abs: 10.2 10*3/uL — ABNORMAL HIGH (ref 1.7–7.7)
Neutrophils Relative %: 74 %
Platelets: 295 10*3/uL (ref 150–400)
RBC: 4.87 MIL/uL (ref 4.22–5.81)
RDW: 13.5 % (ref 11.5–15.5)
WBC: 13.7 10*3/uL — ABNORMAL HIGH (ref 4.0–10.5)
nRBC: 0 % (ref 0.0–0.2)

## 2020-02-23 LAB — BASIC METABOLIC PANEL
Anion gap: 12 (ref 5–15)
BUN: 12 mg/dL (ref 6–20)
CO2: 28 mmol/L (ref 22–32)
Calcium: 9.6 mg/dL (ref 8.9–10.3)
Chloride: 96 mmol/L — ABNORMAL LOW (ref 98–111)
Creatinine, Ser: 0.81 mg/dL (ref 0.61–1.24)
GFR calc Af Amer: >60 mL/min (ref >60)
GFR calc non Af Amer: >60 mL/min (ref >60)
Glucose, Bld: 160 mg/dL — ABNORMAL HIGH (ref 70–99)
Potassium: 3.5 mmol/L (ref 3.5–5.1)
Sodium: 136 mmol/L (ref 135–145)

## 2020-02-23 MED ORDER — COLCHICINE 0.6 MG PO TABS: ORAL_TABLET | ORAL | 0 refills | Status: AC

## 2020-02-23 MED ORDER — KETOROLAC TROMETHAMINE 60 MG/2ML IM SOLN
60.0000 mg | Freq: Once | INTRAMUSCULAR | Status: AC
  Administered 2020-02-23: 60 mg via INTRAMUSCULAR
  Filled 2020-02-23: qty 2

## 2020-02-23 NOTE — ED Triage Notes (Signed)
Pt reports pain and swelling to r ankle and foot since yesterday evening.  Denies injury.  Foot warm and dry.  Pedal pulse present.

## 2020-02-23 NOTE — ED Provider Notes (Signed)
[facility] EMERGENCY DEPARTMENT Provider Note   CSN: [id number] Arrival date & time: 02/23/20  0630     History Chief Complaint  Patient presents with  . Ankle Pain    [name] is a 54 y.o. male.  HPI     [name] is a 54 y.o. male with past medical history significant for hypertension, gout, diabetes, who presents to the Emergency Department complaining of pain swelling and mild redness of his right foot extending into his right ankle.  Symptoms began yesterday.  He states they have been gradually worsening and today he is unable to bear weight to his right foot.  He reports history of gout, but states this pain is not typical of his previous gout flares.  He describes the pain to his foot and ankle as constant and throbbing.  He is unsure if there has been a injury to his foot.  Nothing alleviates his pain.  He denies numbness, tingling, pain radiating into his lower leg, fever, chills, or any new medications.    Past Medical History:  Diagnosis Date  . Acid reflux disease   . Arthritis   . Asthma   . Gout   . Headache   . High blood pressure   . High cholesterol   . Psoriasis     Patient Active Problem List   Diagnosis Date Noted  . Bilateral lower leg cellulitis 03/16/2018  . HLD (hyperlipidemia) 03/16/2018  . Obesity, Class III, BMI 40-49.9 (morbid obesity) (HCC) 03/16/2018  . Psoriasis 03/16/2018  . GERD (gastroesophageal reflux disease) 03/16/2018  . Asthma 03/16/2018  . Hypothyroidism 03/16/2018  . ASEPTIC NECROSIS 09/16/2007  . Benign essential HTN 08/17/2007    Past Surgical History:  Procedure Laterality Date  . JOINT REPLACEMENT     two total hip replacements  . MULTIPLE EXTRACTIONS WITH ALVEOLOPLASTY N/A 06/19/2017   Procedure: MULTIPLE EXTRACTION WITH ALVEOLOPLASTY;  Surgeon: Ocie Doyne, DDS;  Location: [facility] OR;  Service: Oral Surgery;  Laterality: N/A;  . SPINAL FUSION    . TOTAL HIP ARTHROPLASTY         Family History    Problem Relation Age of Onset  . Diabetes Other     Social History   Tobacco Use  . Smoking status: Current Every Day Smoker    Packs/day: 1.00    Years: 20.00    Pack years: 20.00    Types: Cigarettes  . Smokeless tobacco: Never Used  . Tobacco comment: patient states he smokes daily but only socially   Substance Use Topics  . Alcohol use: Yes    Comment: occasionally  . Drug use: No    Home Medications Prior to Admission medications   Medication Sig Start Date End Date Taking? Authorizing Provider  albuterol (PROVENTIL HFA;VENTOLIN HFA) 108 (90 BASE) MCG/ACT inhaler Inhale 2 puffs into the lungs 4 (four) times daily as needed for wheezing or shortness of breath.     [provider]  albuterol (PROVENTIL) (2.5 MG/3ML) 0.083% nebulizer solution Inhale 3 mLs into the lungs 4 (four) times daily as needed for wheezing or shortness of breath. 03/19/18   Oval Linsey, MD  amLODipine (NORVASC) 5 MG tablet Take 1 tablet (5 mg total) by mouth daily. 03/20/18   Oval Linsey, MD  aspirin EC 325 MG tablet Take 1 tablet (325 mg total) by mouth daily. 09/25/18   Gerhard Munch, MD  atorvastatin (LIPITOR) 40 MG tablet Take 40 mg by mouth daily.    [provider]  cholecalciferol (VITAMIN D) 1000 units tablet Take 5,000 Units by mouth daily.    [provider]  hydrOXYzine (ATARAX/VISTARIL) 25 MG tablet Take 1 tablet (25 mg total) by mouth every 6 (six) hours as needed for itching. 03/15/15   Kinsie Belford, PA-C  levothyroxine (SYNTHROID, LEVOTHROID) 50 MCG tablet Take 50 mcg by mouth daily before breakfast.    [provider]  lisinopril (PRINIVIL,ZESTRIL) 20 MG tablet Take 20 mg by mouth daily.    [provider]  naproxen (NAPROSYN) 500 MG tablet Take 500 mg by mouth daily.     [provider]  omeprazole (PRILOSEC) 20 MG capsule Take 20 mg by mouth daily as needed (for acid reflux).     [provider]  oxyCODONE (OXY  IR/ROXICODONE) 5 MG immediate release tablet Take 5 mg by mouth every 6 (six) hours as needed for moderate pain.    [provider]  potassium chloride SA (K-DUR,KLOR-CON) 20 MEQ tablet Take 1 tablet (20 mEq total) by mouth daily. 03/20/18   Oval Linsey, MD  Skin Protectants, Misc. (EUCERIN) cream Apply 1 application topically as needed for dry skin.    [provider]    Allergies    Patient has no known allergies.  Review of Systems   Review of Systems  Constitutional: Negative for chills and fever.  Respiratory: Negative for shortness of breath.   Cardiovascular: Negative for chest pain.  Gastrointestinal: Negative for abdominal pain, nausea and vomiting.  Genitourinary: Negative for dysuria.  Musculoskeletal: Positive for arthralgias (right foot and ankle pain and swelling). Negative for neck pain.  Skin: Positive for color change. Negative for wound.  Neurological: Negative for dizziness, weakness and numbness.    Physical Exam Updated Vital Signs BP (!) 185/96 (BP Location: Right Arm)   Pulse (!) 108   Temp 98.3 F (36.8 C) (Oral)   Resp 18   Ht 5\' 9"  (1.753 m)   Wt 116.1 kg   SpO2 99%   BMI 37.80 kg/m   Physical Exam Vitals and nursing note reviewed.  Constitutional:      Appearance: Normal appearance. He is not ill-appearing or toxic-appearing.  HENT:     Mouth/Throat:     Mouth: Mucous membranes are moist.  Cardiovascular:     Rate and Rhythm: Normal rate and regular rhythm.     Pulses: Normal pulses.  Pulmonary:     Effort: Pulmonary effort is normal.  Chest:     Chest wall: No tenderness.  Abdominal:     General: There is no distension.     Palpations: Abdomen is soft.     Tenderness: There is no abdominal tenderness.  Musculoskeletal:        General: Tenderness present. No swelling or deformity.     Cervical back: Normal range of motion.  Skin:    General: Skin is warm.     Capillary Refill: Capillary refill takes less than 2  seconds.     Findings: Erythema present. No rash.     Comments: Scant erythema of the lateral right ankle, right great toe and distal aspect of the dorsal foot.  No tenderness proximal to the ankle.  No open wounds   Neurological:     General: No focal deficit present.     Mental Status: He is alert.     ED Results / Procedures / Treatments   Labs (all labs ordered are listed, but only abnormal results are displayed) Labs Reviewed  BASIC METABOLIC PANEL - Abnormal;  Notable for the following components:      Result Value   Chloride 96 (*)    Glucose, Bld 160 (*)    All other components within normal limits  CBC WITH DIFFERENTIAL/PLATELET - Abnormal; Notable for the following components:   WBC 13.7 (*)    Neutro Abs 10.2 (*)    Monocytes Absolute 1.5 (*)    All other components within normal limits    EKG None  Radiology DG Ankle Complete Right  Result Date: 02/23/2020 CLINICAL DATA:  Ankle pain and swelling, no known injury, initial encounter EXAM: RIGHT ANKLE - COMPLETE 3+ VIEW COMPARISON:  None. FINDINGS: Soft tissue swelling is noted about the ankle similar to that seen in the foot. No acute fracture or dislocation is noted. Changes suspicious for prior distal tibial infarct are noted. IMPRESSION: Soft tissue swelling without acute bony abnormality. Electronically Signed   By: Inez Catalina M.D.   On: 02/23/2020 09:21   DG Foot Complete Right  Result Date: 02/23/2020 CLINICAL DATA:  Foot pain, no known injury, initial encounter EXAM: RIGHT FOOT COMPLETE - 3+ VIEW COMPARISON:  None. FINDINGS: There is soft tissue swelling identified along the metatarsals although no underlying bony abnormality is seen. No radiopaque foreign body is noted. IMPRESSION: Soft tissue swelling without acute bony abnormality. Electronically Signed   By: Inez Catalina M.D.   On: 02/23/2020 09:20    Procedures Procedures (including critical care time)  Medications Ordered in ED Medications - No data  to display  ED Course  I have reviewed the triage vital signs and the nursing notes.  Pertinent labs & imaging results that were available during my care of the patient were reviewed by me and considered in my medical decision making (see chart for details).    MDM Rules/Calculators/A&P                      Patient here with complaint of right ankle pain and swelling for 1 day.  Neurovascularly intact.  No open wounds, fever or significant erythema to the extremity to suggest cellulitis.  Patient unsure of possible injury, so x-rays were obtained. Pt also diabetic and admits to poor compliance with blood sugar monitoring, so I will also obtain CBC and BMP to check electrolytes and evaluate for leukocytosis.    X-ray of the right ankle and foot are reviewed by me.  Vitals reviewed and improved on recheck.  No evidence of acute bony abnormality.  I feel his sx's are likely related to a gout flare.  Cellulitis is considered, but felt to be less likely.  Patient given injection of IM Toradol here.  He is currently taking narcotic pain medication for chronic pain, so I do feel that additional narcotics are not indicated at this time.  With his history of diabetes I am also refraining from steroid use and colchicine prescribed.  He appears appropriate for discharge home, agrees to treatment plan and close outpatient follow-up.  Return precautions were discussed.   Final Clinical Impression(s) / ED Diagnoses Final diagnoses:  Right foot pain  Acute gout of right ankle, unspecified cause    Rx / DC Orders ED Discharge Orders    None       Kem Parkinson, PA-C 02/23/20 1114    Elnora Morrison, MD 02/23/20 1547

## 2020-02-23 NOTE — ED Provider Notes (Signed)
[facility] EMERGENCY DEPARTMENT Provider Note   CSN: [id number] Arrival date & time: 02/23/20  2263     History Chief Complaint  Patient presents with  . Ankle Pain    [name] is a 54 y.o. male.  HPI      [name] is a 54 y.o. male with past medical history of hypertension, arthritis, gout, and asthma who presents to the Emergency Department complaining of right ankle pain and swelling for 1 day.  He noticed the pain on the lateral right ankle that is now spread to the distal foot.  Swelling of gradual onset.  Pain is worse with weightbearing and to palpation along the lateral ankle and distal foot.  He denies known injury.  He states the pain feels somewhat similar to previous gout flares.  He has not tried any therapies prior to arrival.  He states he takes oxycodone for chronic joint and back pain, but states the medication is not helping his ankle and foot pain.  Past Medical History:  Diagnosis Date  . Acid reflux disease   . Arthritis   . Asthma   . Gout   . Headache   . High blood pressure   . High cholesterol   . Psoriasis     Patient Active Problem List   Diagnosis Date Noted  . Bilateral lower leg cellulitis 03/16/2018  . HLD (hyperlipidemia) 03/16/2018  . Obesity, Class III, BMI 40-49.9 (morbid obesity) (HCC) 03/16/2018  . Psoriasis 03/16/2018  . GERD (gastroesophageal reflux disease) 03/16/2018  . Asthma 03/16/2018  . Hypothyroidism 03/16/2018  . ASEPTIC NECROSIS 09/16/2007  . Benign essential HTN 08/17/2007    Past Surgical History:  Procedure Laterality Date  . JOINT REPLACEMENT     two total hip replacements  . MULTIPLE EXTRACTIONS WITH ALVEOLOPLASTY N/A 06/19/2017   Procedure: MULTIPLE EXTRACTION WITH ALVEOLOPLASTY;  Surgeon: Ocie Doyne, DDS;  Location: [facility] OR;  Service: Oral Surgery;  Laterality: N/A;  . SPINAL FUSION    . TOTAL HIP ARTHROPLASTY         Family History  Problem Relation Age of Onset  . Diabetes Other      Social History   Tobacco Use  . Smoking status: Current Every Day Smoker    Packs/day: 1.00    Years: 20.00    Pack years: 20.00    Types: Cigarettes  . Smokeless tobacco: Never Used  . Tobacco comment: patient states he smokes daily but only socially   Substance Use Topics  . Alcohol use: Yes    Comment: occasionally  . Drug use: No    Home Medications Prior to Admission medications   Medication Sig Start Date End Date Taking? Authorizing Provider  albuterol (PROVENTIL HFA;VENTOLIN HFA) 108 (90 BASE) MCG/ACT inhaler Inhale 2 puffs into the lungs 4 (four) times daily as needed for wheezing or shortness of breath.     [provider]  albuterol (PROVENTIL) (2.5 MG/3ML) 0.083% nebulizer solution Inhale 3 mLs into the lungs 4 (four) times daily as needed for wheezing or shortness of breath. 03/19/18   Oval Linsey, MD  amLODipine (NORVASC) 5 MG tablet Take 1 tablet (5 mg total) by mouth daily. 03/20/18   Oval Linsey, MD  aspirin EC 325 MG tablet Take 1 tablet (325 mg total) by mouth daily. 09/25/18   Gerhard Munch, MD  atorvastatin (LIPITOR) 40 MG tablet Take 40 mg by mouth daily.    [provider]  cholecalciferol (VITAMIN D) 1000 units tablet Take  5,000 Units by mouth daily.    [provider]  hydrOXYzine (ATARAX/VISTARIL) 25 MG tablet Take 1 tablet (25 mg total) by mouth every 6 (six) hours as needed for itching. 03/15/15   Corean Yoshimura, PA-C  levothyroxine (SYNTHROID, LEVOTHROID) 50 MCG tablet Take 50 mcg by mouth daily before breakfast.    [provider]  lisinopril (PRINIVIL,ZESTRIL) 20 MG tablet Take 20 mg by mouth daily.    [provider]  naproxen (NAPROSYN) 500 MG tablet Take 500 mg by mouth daily.     [provider]  omeprazole (PRILOSEC) 20 MG capsule Take 20 mg by mouth daily as needed (for acid reflux).     [provider]  oxyCODONE (OXY IR/ROXICODONE) 5 MG immediate release tablet Take 5  mg by mouth every 6 (six) hours as needed for moderate pain.    [provider]  potassium chloride SA (K-DUR,KLOR-CON) 20 MEQ tablet Take 1 tablet (20 mEq total) by mouth daily. 03/20/18   Lucia Gaskins, MD  Skin Protectants, Misc. (EUCERIN) cream Apply 1 application topically as needed for dry skin.    [provider]    Allergies    Patient has no known allergies.  Review of Systems   Review of Systems  Constitutional: Negative for chills and fever.  Respiratory: Negative for shortness of breath.   Cardiovascular: Negative for chest pain.  Gastrointestinal: Negative for nausea and vomiting.  Musculoskeletal: Positive for arthralgias (Right foot and ankle pain and swelling).  Skin: Negative for color change and wound.  Neurological: Negative for weakness and numbness.    Physical Exam Updated Vital Signs BP (!) 185/96 (BP Location: Right Arm)   Pulse (!) 108   Temp 98.3 F (36.8 C) (Oral)   Resp 18   Ht 5\' 9"  (1.753 m)   Wt 116.1 kg   SpO2 99%   BMI 37.80 kg/m   Physical Exam Vitals and nursing note reviewed.  Constitutional:      General: He is not in acute distress.    Appearance: Normal appearance. He is well-developed. He is not ill-appearing.  HENT:     Head: Normocephalic.  Cardiovascular:     Rate and Rhythm: Normal rate and regular rhythm.     Pulses: Normal pulses.  Pulmonary:     Effort: Pulmonary effort is normal.     Breath sounds: Normal breath sounds.  Musculoskeletal:        General: Swelling and tenderness present. No signs of injury.     Comments: Tender to palpation of the lateral right ankle and distal right foot.  There is mild erythema noted at the lateral ankle and first metatarsal head.  No lymphangitis.  No rash or open wounds of the foot or ankle.  No tenderness proximal to the ankle.  Skin:    General: Skin is warm.  Neurological:     General: No focal deficit present.     Mental Status: He is alert.     Sensory: No  sensory deficit.     Motor: No weakness or abnormal muscle tone.     ED Results / Procedures / Treatments   Labs (all labs ordered are listed, but only abnormal results are displayed) Labs Reviewed - No data to display  EKG None  Radiology DG Ankle Complete Right  Result Date: 02/23/2020 CLINICAL DATA:  Ankle pain and swelling, no known injury, initial encounter EXAM: RIGHT ANKLE - COMPLETE 3+ VIEW COMPARISON:  None. FINDINGS: Soft tissue swelling is noted  about the ankle similar to that seen in the foot. No acute fracture or dislocation is noted. Changes suspicious for prior distal tibial infarct are noted. IMPRESSION: Soft tissue swelling without acute bony abnormality. Electronically Signed   By: Alcide Clever M.D.   On: 02/23/2020 09:21   DG Foot Complete Right  Result Date: 02/23/2020 CLINICAL DATA:  Foot pain, no known injury, initial encounter EXAM: RIGHT FOOT COMPLETE - 3+ VIEW COMPARISON:  None. FINDINGS: There is soft tissue swelling identified along the metatarsals although no underlying bony abnormality is seen. No radiopaque foreign body is noted. IMPRESSION: Soft tissue swelling without acute bony abnormality. Electronically Signed   By: Alcide Clever M.D.   On: 02/23/2020 09:20    Procedures Procedures (including critical care time)  Medications Ordered in ED Medications - No data to display  ED Course  I have reviewed the triage vital signs and the nursing notes.  Pertinent labs & imaging results that were available during my care of the patient were reviewed by me and considered in my medical decision making (see chart for details).    MDM Rules/Calculators/A&P                      Patient here with 1 day history of right foot and ankle pain and swelling.  No known injury.  Neurovascularly intact.  Does have a history of gout and reports pain is similar to previous episodes.  X-rays obtained and results reviewed by me.  Plain films show no evidence of acute bony  injury.  There is very mild erythema noted along the lateral ankle and along the first metatarsal head.  He is tender in these areas.  His current symptoms are felt to be related to a gout flare.  No open wound, rash or lymphangitis to suggest cellulitis.  Patient has narcotic pain medication at home, I do not feel additional narcotics are indicated at this time.  We will treat with colchicine and he agrees to close outpatient follow-up.   Final Clinical Impression(s) / ED Diagnoses Final diagnoses:  Right foot pain  Acute gout of right ankle, unspecified cause    Rx / DC Orders ED Discharge Orders    None       Pauline Aus, PA-C 02/25/20 [id number]    Blane Ohara, MD 02/29/20 [phone]

## 2020-02-23 NOTE — Discharge Instructions (Addendum)
Minimal weightbearing and elevate your foot when possible.  Take medication as directed.  You may continue your prescribed pain medication as directed.  Call your primary doctor for recheck return the emergency department for any worsening symptoms.

## 2020-03-05 IMAGING — US US EXTREM LOW VENOUS*L*
1 series · 13 of 24 positions shown · non-contrast
Comparison: None.

CLINICAL DATA: 50-year-old male with left lower extremity pain.

EXAM:
Left LOWER EXTREMITY VENOUS DOPPLER ULTRASOUND
TECHNIQUE: Gray-scale sonography with compression, as well as color and duplex
ultrasound, were performed to evaluate the deep venous system(s)
from the level of the common femoral vein through the popliteal and
proximal calf veins.

[Series 1: us extrem low venous*left* · 0.08mm/px · 13 of 53 slices shown]
[im 1/53]
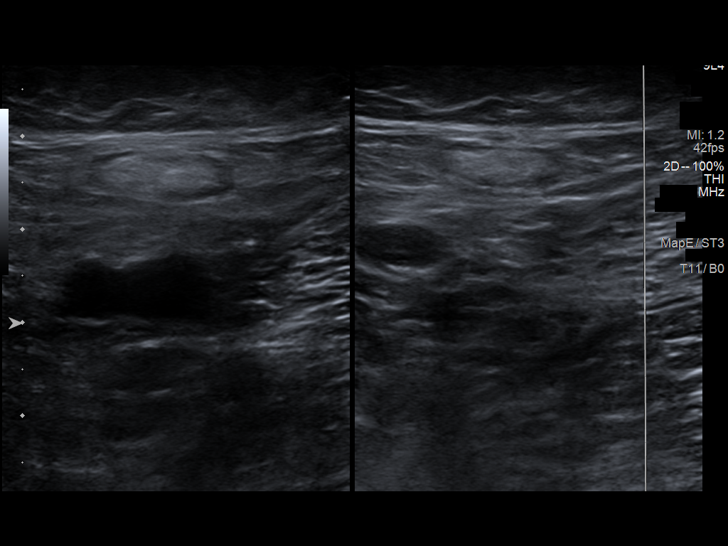
[im 5/53]
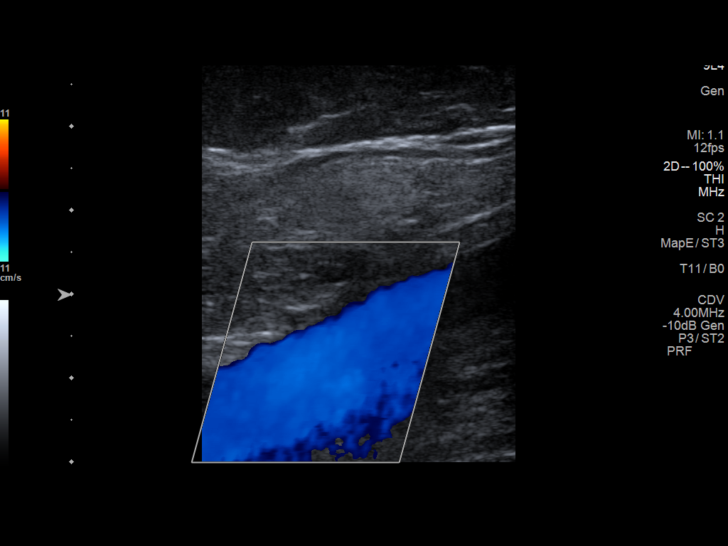
[im 10/53]
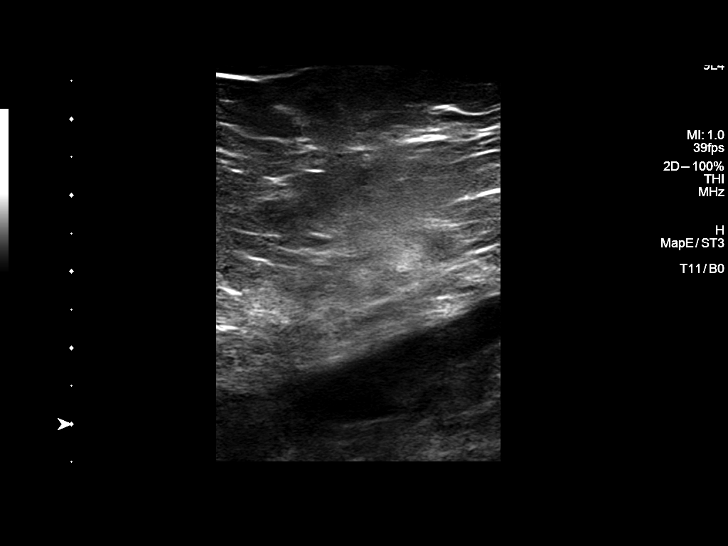
[im 14/53]
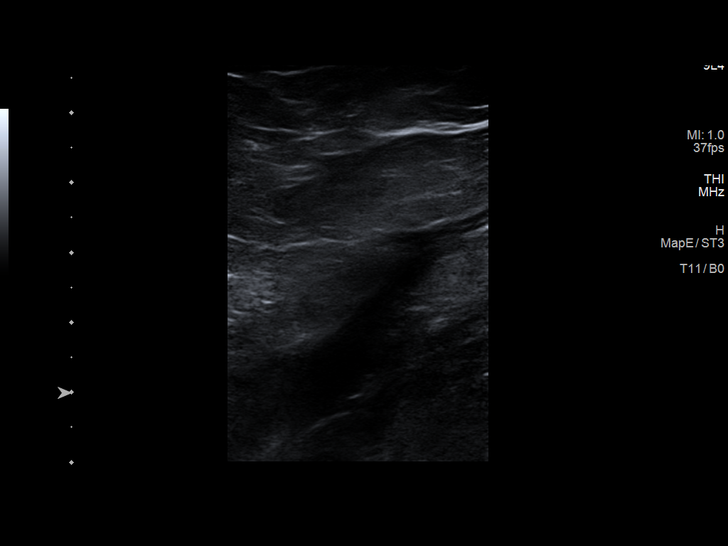
[im 19/53]
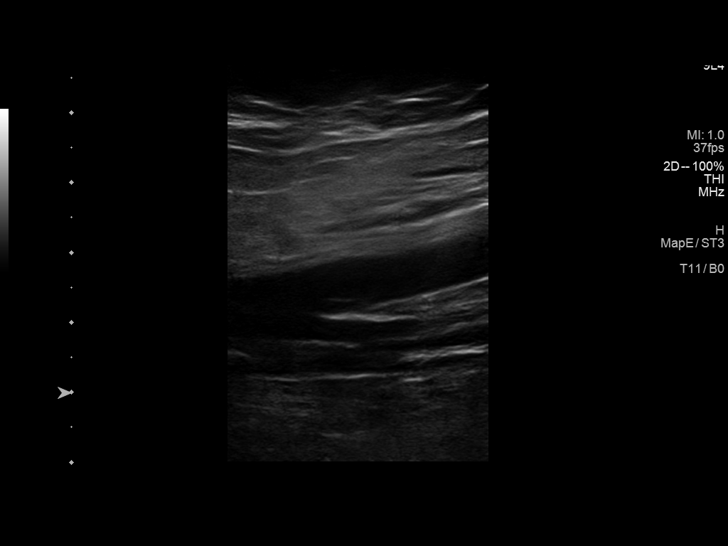
[im 23/53]
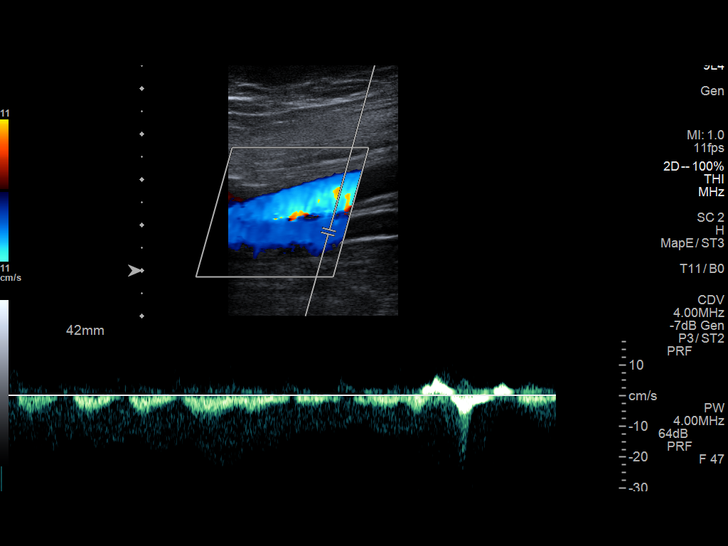
[im 28/53]
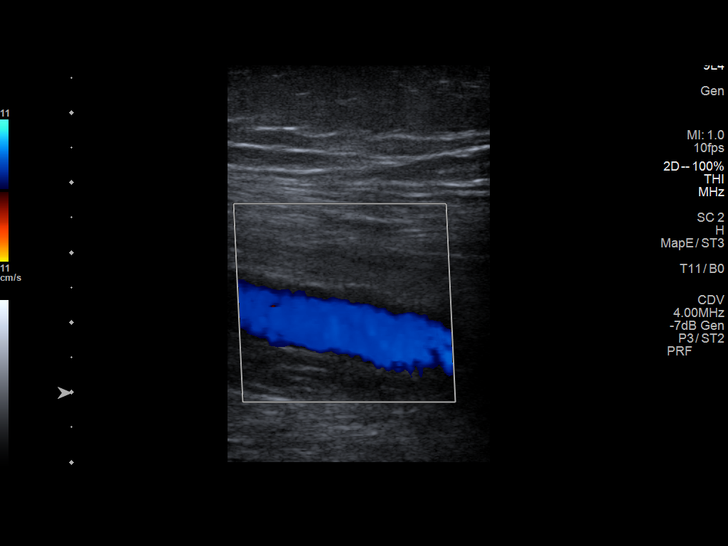
[im 30/53]
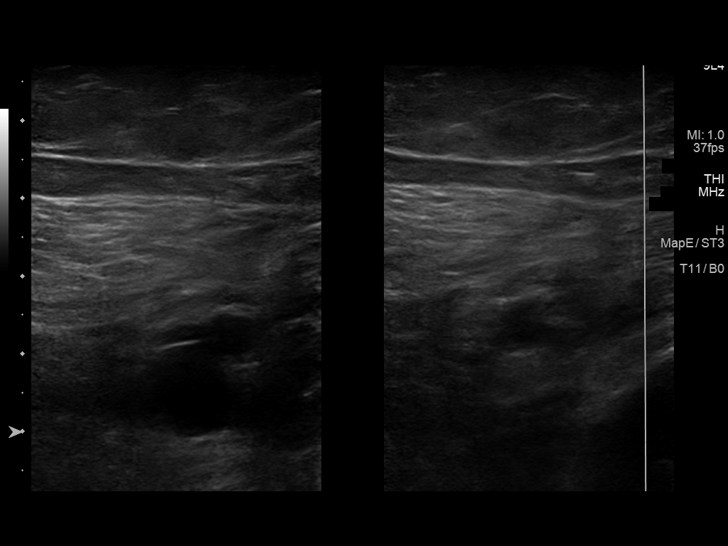
[im 34/53]
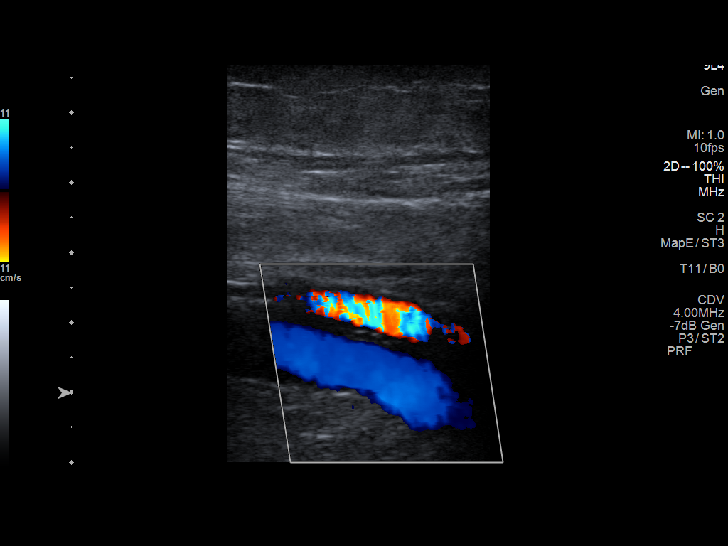
[im 39/53]
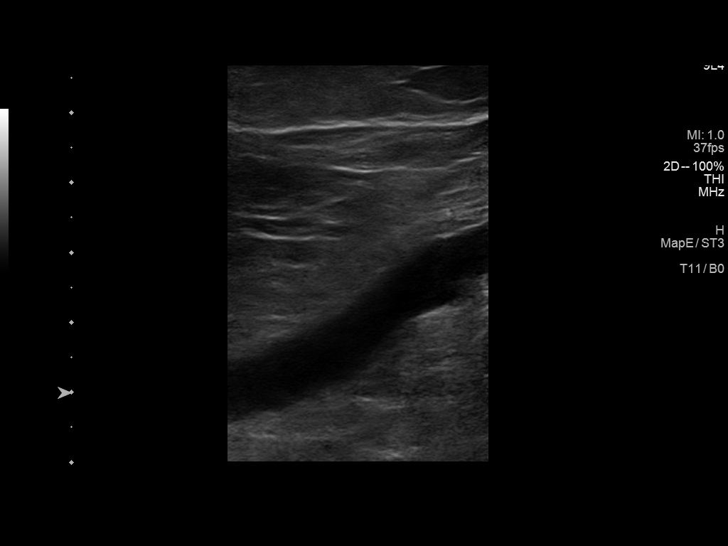
[im 43/53]
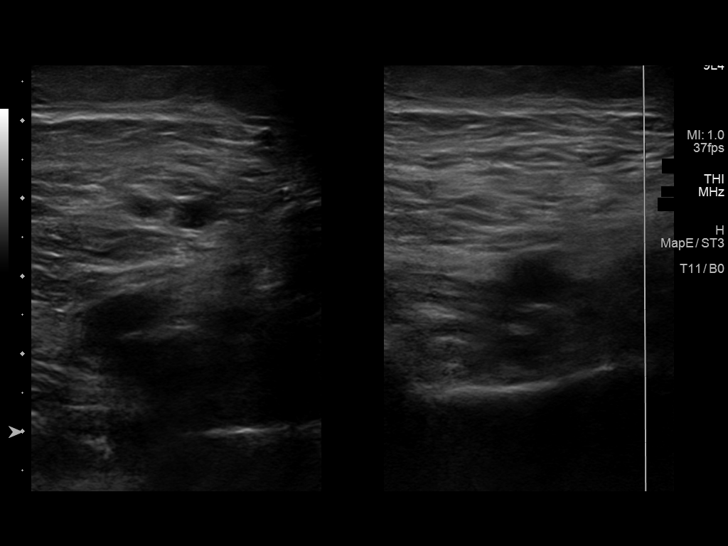
[im 48/53]
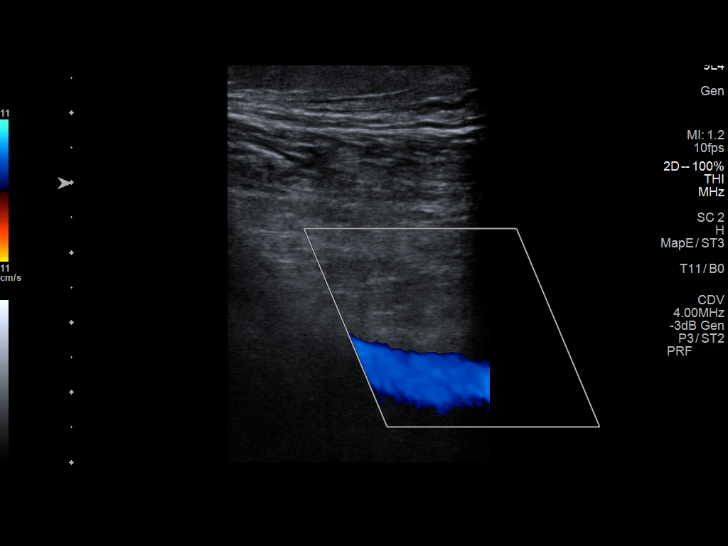
[im 53/53]
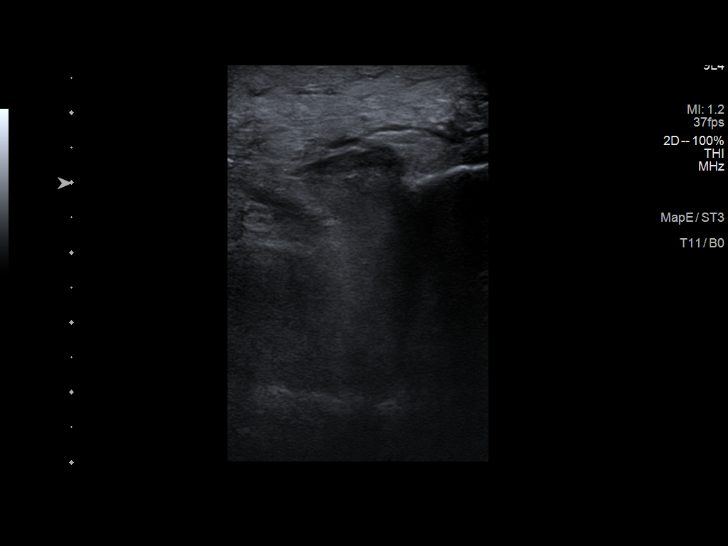

[13 of 24 positions shown; findings below may reference images not displayed]

FINDINGS: VENOUS

Normal compressibility of the common femoral, superficial femoral,
and popliteal veins, as well as the visualized calf veins.
Visualized portions of profunda femoral vein and great saphenous
vein unremarkable. No filling defects to suggest DVT on grayscale or
color Doppler imaging. Doppler waveforms show normal direction of
venous flow, normal respiratory phasicity and response to
augmentation.

Limited views of the contralateral common femoral vein are
unremarkable.

OTHER

There is mild subcutaneous edema of the distal leg and ankle.

Limitations: none
IMPRESSION: No femoropopliteal DVT nor evidence of DVT within the visualized
calf veins.

If clinical symptoms are inconsistent or if there are persistent or
worsening symptoms, further imaging (possibly involving the iliac
veins) may be warranted.

## 2020-03-05 IMAGING — DX DG KNEE COMPLETE 4+V*L*
4 series · 4 of 4 positions shown · non-contrast
Comparison: None.

CLINICAL DATA: 53-year-old male with left knee pain.

EXAM:
LEFT KNEE - COMPLETE 4+ VIEW

[knee ap]
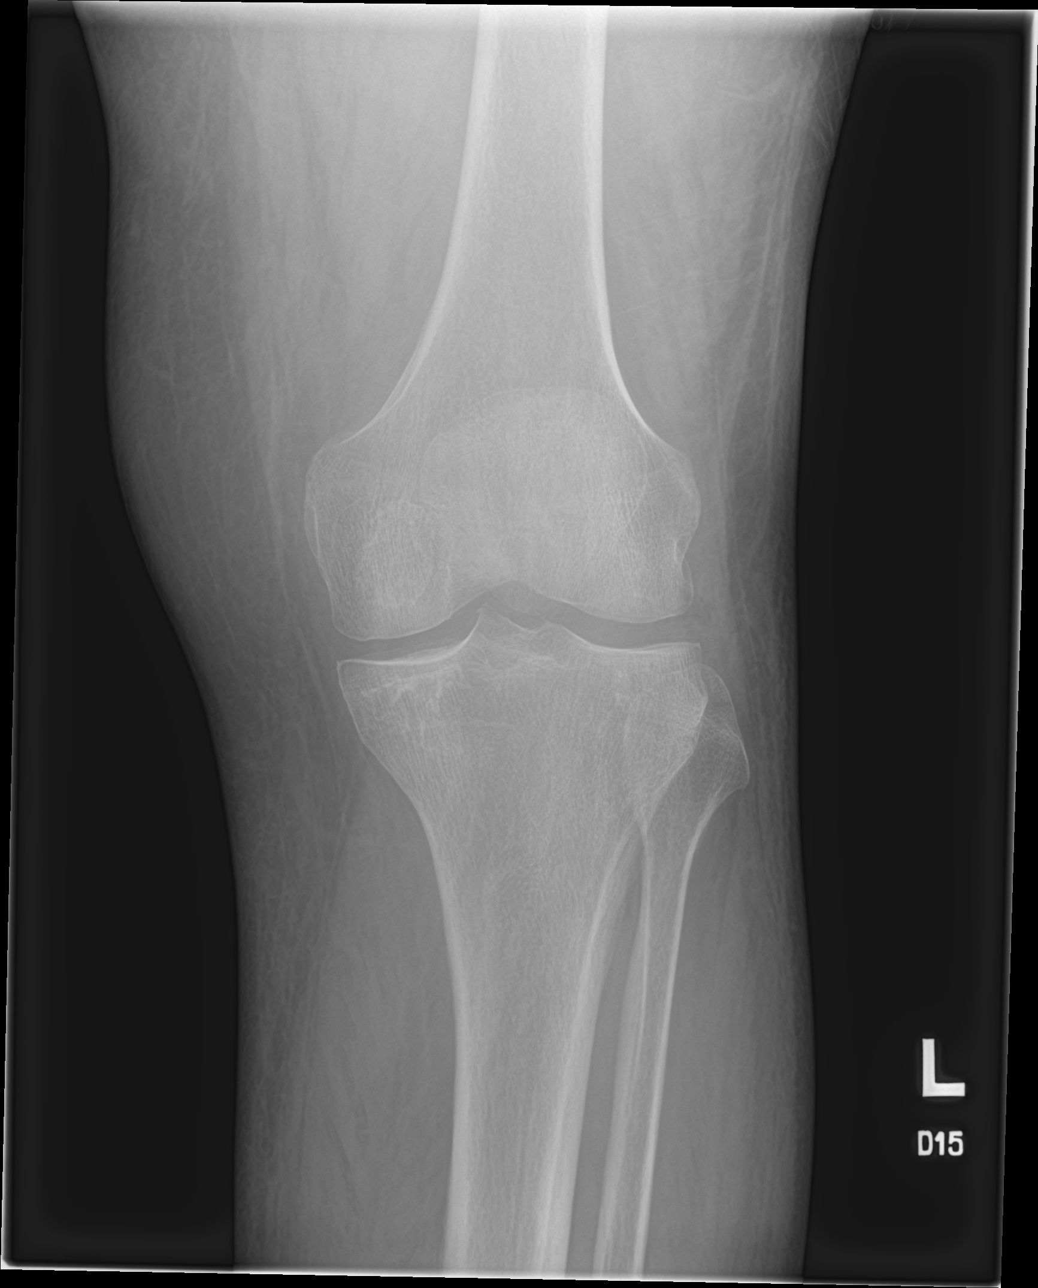

[knee obl (1 of 2)]
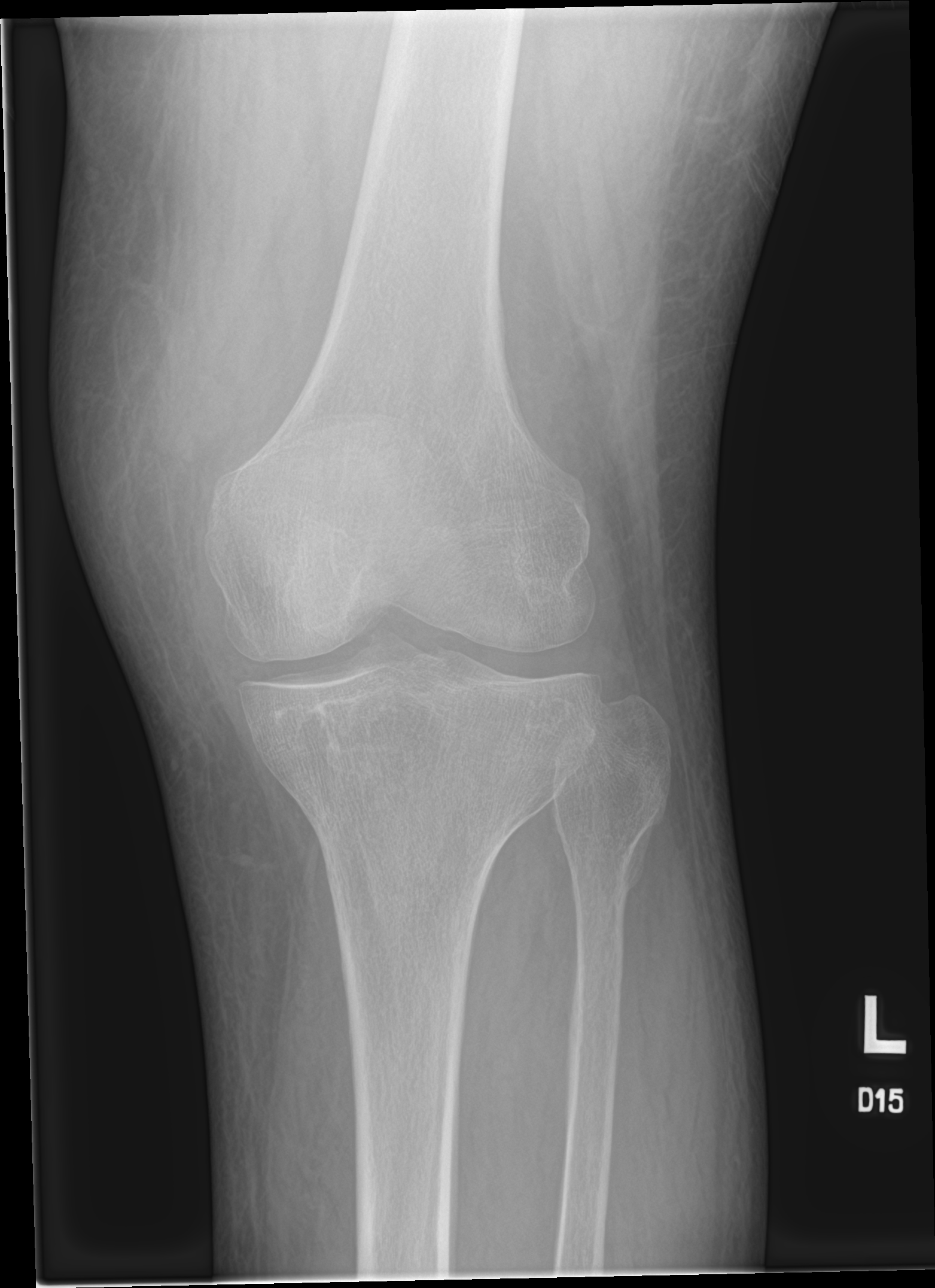

[knee obl (2 of 2)]
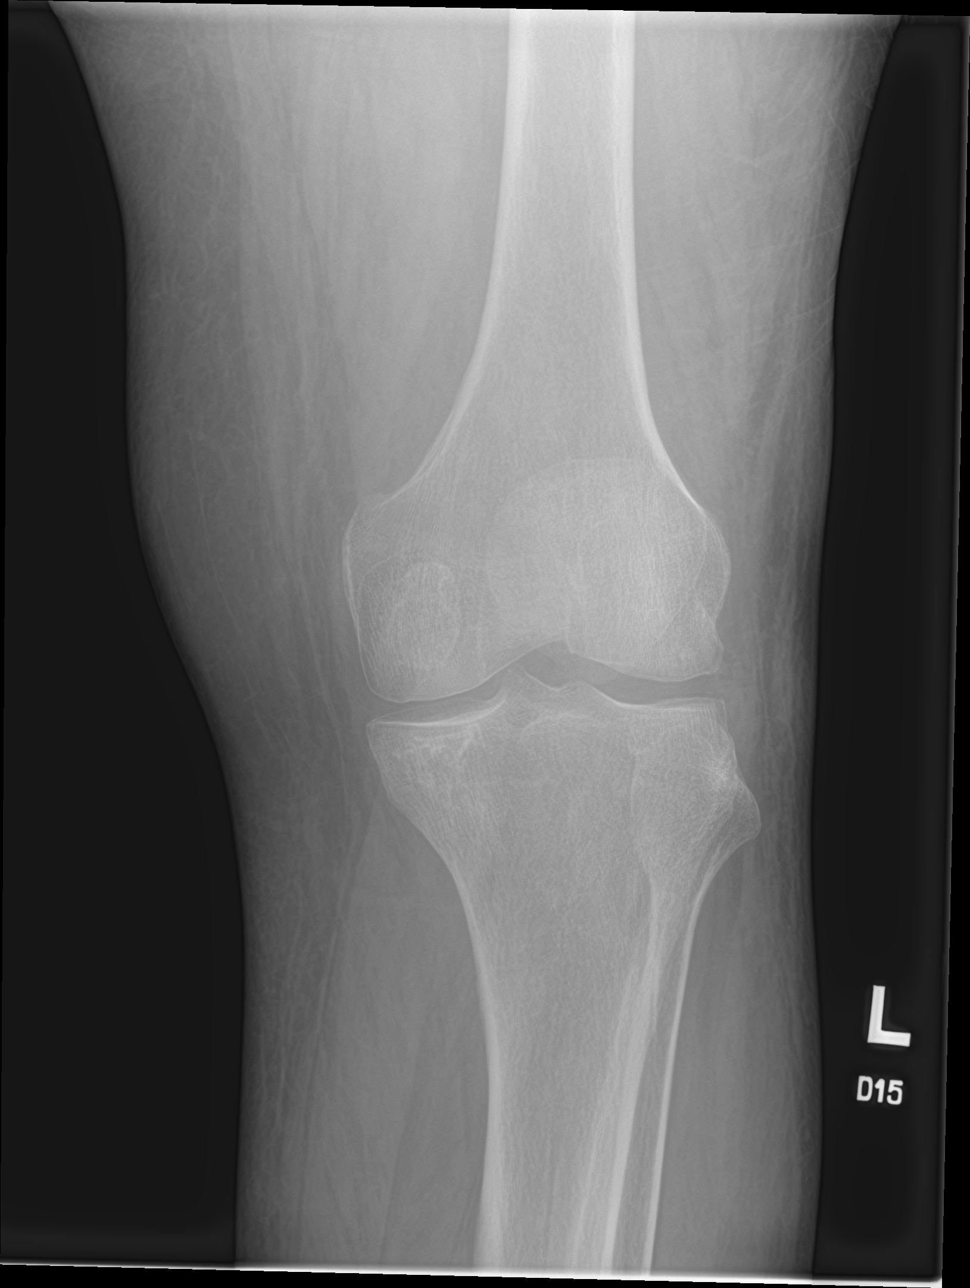

[knee lat]
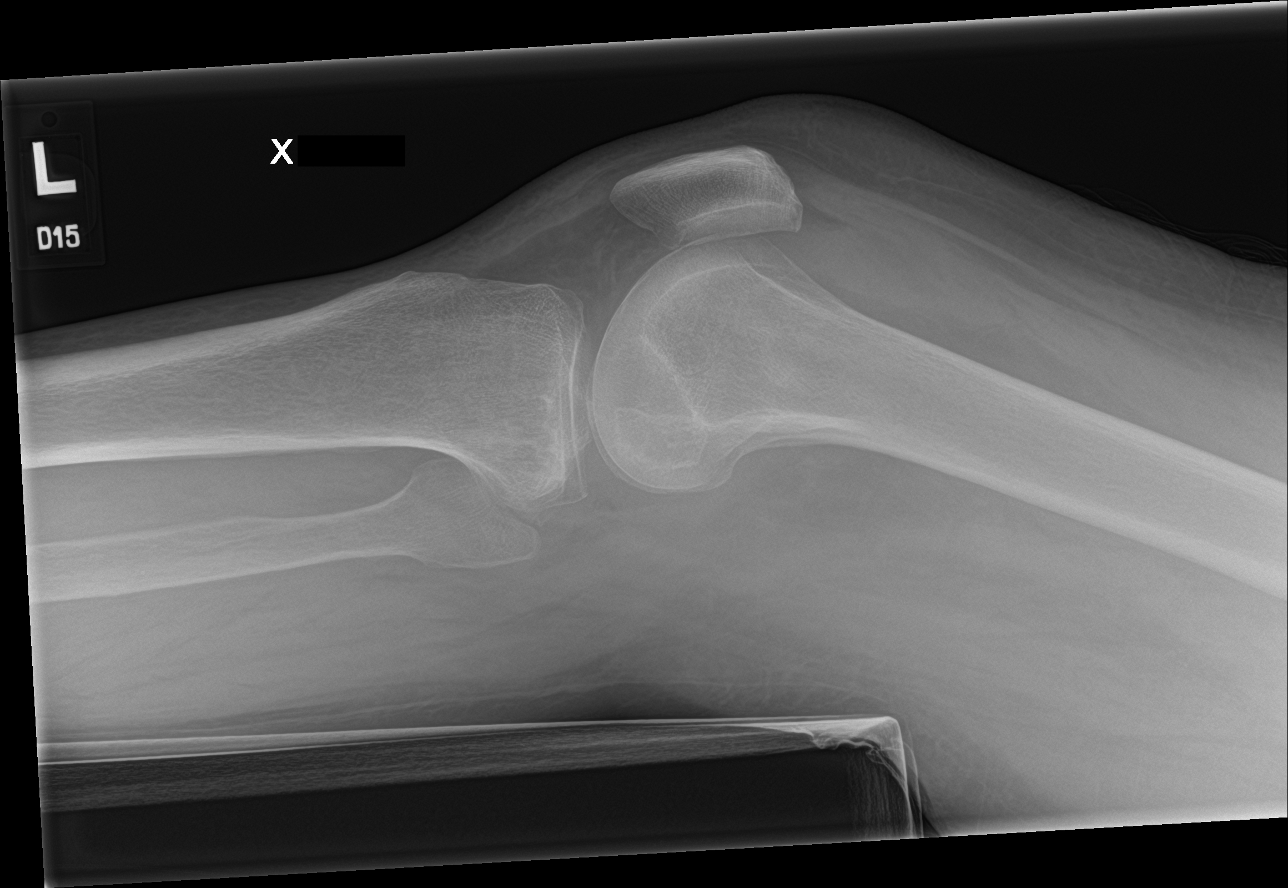

[4 of 4 positions shown; findings below may reference images not displayed]

FINDINGS: There is no acute fracture or dislocation. The bones are osteopenic.
No significant arthritic changes. The there is a small to moderate
suprapatellar effusion. The soft tissues are grossly unremarkable.
IMPRESSION: 1. No acute fracture or dislocation.
2. Small to moderate suprapatellar effusion.

## 2020-05-02 ENCOUNTER — Emergency Department (HOSPITAL_COMMUNITY)
Admission: EM | Admit: 2020-05-02 | Discharge: 2020-05-02 | Disposition: A | Discharge status: Home / Self Care | Payer: Medicaid Other | Attending: Emergency Medicine | Admitting: Emergency Medicine

## 2020-05-02 ENCOUNTER — Other Ambulatory Visit: Payer: Self-pay

## 2020-05-02 ENCOUNTER — Encounter (HOSPITAL_COMMUNITY): Payer: Self-pay | Admitting: Emergency Medicine

## 2020-05-02 DIAGNOSIS — Z79899 Other long term (current) drug therapy: Secondary | ICD-10-CM | POA: Diagnosis not present

## 2020-05-02 DIAGNOSIS — J45909 Unspecified asthma, uncomplicated: Secondary | ICD-10-CM | POA: Insufficient documentation

## 2020-05-02 DIAGNOSIS — F17210 Nicotine dependence, cigarettes, uncomplicated: Secondary | ICD-10-CM | POA: Diagnosis not present

## 2020-05-02 DIAGNOSIS — E039 Hypothyroidism, unspecified: Secondary | ICD-10-CM | POA: Insufficient documentation

## 2020-05-02 DIAGNOSIS — Z79890 Hormone replacement therapy (postmenopausal): Secondary | ICD-10-CM | POA: Insufficient documentation

## 2020-05-02 DIAGNOSIS — I10 Essential (primary) hypertension: Secondary | ICD-10-CM | POA: Diagnosis not present

## 2020-05-02 DIAGNOSIS — L539 Erythematous condition, unspecified: Secondary | ICD-10-CM | POA: Insufficient documentation

## 2020-05-02 DIAGNOSIS — L299 Pruritus, unspecified: Secondary | ICD-10-CM | POA: Diagnosis present

## 2020-05-02 DIAGNOSIS — Z96659 Presence of unspecified artificial knee joint: Secondary | ICD-10-CM | POA: Insufficient documentation

## 2020-05-02 DIAGNOSIS — Z7982 Long term (current) use of aspirin: Secondary | ICD-10-CM | POA: Diagnosis not present

## 2020-05-02 DIAGNOSIS — L409 Psoriasis, unspecified: Secondary | ICD-10-CM | POA: Diagnosis not present

## 2020-05-02 MED ORDER — KETOROLAC TROMETHAMINE 60 MG/2ML IM SOLN
60.0000 mg | Freq: Once | INTRAMUSCULAR | Status: AC
  Administered 2020-05-02: 60 mg via INTRAMUSCULAR
  Filled 2020-05-02: qty 2

## 2020-05-02 MED ORDER — NAPROXEN 250 MG PO TABS: 500.0000 mg | ORAL_TABLET | Freq: Once | ORAL | Status: DC

## 2020-05-02 MED ORDER — LISINOPRIL 10 MG PO TABS
20.0000 mg | ORAL_TABLET | Freq: Once | ORAL | Status: AC
  Administered 2020-05-02: 20 mg via ORAL
  Filled 2020-05-02: qty 2

## 2020-05-02 NOTE — ED Triage Notes (Addendum)
Pt reports history of psoriasis and reports sites "have been there for awhile" but reports increased redness since Tuesday. Pt reports was given otezla two week trial and reports "I think its making things worse." pt reports sites itch. Pt noted to have patches of scaly red skin noted to bilateral elbows, left forearm. Pt reports additional areas on sacrum and bilateral hips.

## 2020-05-02 NOTE — Discharge Instructions (Addendum)
Contact your primary doctor this week regarding your psoriasis flareup.  Your blood pressure on arrival was elevated.  You have been given your regular dose of lisinopril today.  It is important that you take your blood pressure medication daily as directed.

## 2020-05-05 NOTE — ED Provider Notes (Signed)
[facility] EMERGENCY DEPARTMENT Provider Note   CSN: [id number] Arrival date & time: 05/02/20  1210     History Chief Complaint  Patient presents with  . Rash    [name] is a 54 y.o. male.  HPI      [name] is a 54 y.o. male with hx of psoriasis and HTN, who presents to the Emergency Department complaining of increasing redness and itching of bilateral arms, low back, and buttocks.  Recently began [place] for his psoriasis and reports initial improvement, but now states symptoms of his psoriasis has worsened.  He denies swelling, pain, fever, chills.   Past Medical History:  Diagnosis Date  . Acid reflux disease   . Arthritis   . Asthma   . Gout   . Headache   . High blood pressure   . High cholesterol   . Psoriasis     Patient Active Problem List   Diagnosis Date Noted  . Bilateral lower leg cellulitis 03/16/2018  . HLD (hyperlipidemia) 03/16/2018  . Obesity, Class III, BMI 40-49.9 (morbid obesity) (HCC) 03/16/2018  . Psoriasis 03/16/2018  . GERD (gastroesophageal reflux disease) 03/16/2018  . Asthma 03/16/2018  . Hypothyroidism 03/16/2018  . ASEPTIC NECROSIS 09/16/2007  . Benign essential HTN 08/17/2007    Past Surgical History:  Procedure Laterality Date  . JOINT REPLACEMENT     two total hip replacements  . MULTIPLE EXTRACTIONS WITH ALVEOLOPLASTY N/A 06/19/2017   Procedure: MULTIPLE EXTRACTION WITH ALVEOLOPLASTY;  Surgeon: Ocie Doyne, DDS;  Location: [facility] OR;  Service: Oral Surgery;  Laterality: N/A;  . SPINAL FUSION    . TOTAL HIP ARTHROPLASTY         Family History  Problem Relation Age of Onset  . Diabetes Other     Social History   Tobacco Use  . Smoking status: Current Every Day Smoker    Packs/day: 0.50    Years: 20.00    Pack years: 10.00    Types: Cigarettes  . Smokeless tobacco: Never Used  . Tobacco comment: patient states he smokes daily but only socially   Vaping Use  . Vaping Use: Never used  Substance Use  Topics  . Alcohol use: Yes    Comment: occasionally  . Drug use: No    Home Medications Prior to Admission medications   Medication Sig Start Date End Date Taking? Authorizing Provider  albuterol (PROVENTIL HFA;VENTOLIN HFA) 108 (90 BASE) MCG/ACT inhaler Inhale 2 puffs into the lungs 4 (four) times daily as needed for wheezing or shortness of breath.     [provider]  albuterol (PROVENTIL) (2.5 MG/3ML) 0.083% nebulizer solution Inhale 3 mLs into the lungs 4 (four) times daily as needed for wheezing or shortness of breath. 03/19/18   Oval Linsey, MD  amLODipine (NORVASC) 5 MG tablet Take 1 tablet (5 mg total) by mouth daily. 03/20/18   Oval Linsey, MD  aspirin EC 325 MG tablet Take 1 tablet (325 mg total) by mouth daily. 09/25/18   Gerhard Munch, MD  atorvastatin (LIPITOR) 40 MG tablet Take 40 mg by mouth daily.    [provider]  cholecalciferol (VITAMIN D) 1000 units tablet Take 5,000 Units by mouth daily.    [provider]  colchicine 0.6 MG tablet Take two tablets for first dose, then one tablet one hour later for a max dose of 1.8mg  on the first day.  Then take one tablet twice daily for 3 days. 02/23/20   Pauline Aus, PA-C  hydrOXYzine (ATARAX/VISTARIL) 25 MG tablet Take 1 tablet (25 mg total) by mouth every 6 (six) hours as needed for itching. 03/15/15   Jaquin Coy, PA-C  levothyroxine (SYNTHROID, LEVOTHROID) 50 MCG tablet Take 50 mcg by mouth daily before breakfast.    [provider]  lisinopril (PRINIVIL,ZESTRIL) 20 MG tablet Take 20 mg by mouth daily.    [provider]  naproxen (NAPROSYN) 500 MG tablet Take 500 mg by mouth daily.     [provider]  omeprazole (PRILOSEC) 20 MG capsule Take 20 mg by mouth daily as needed (for acid reflux).     [provider]  oxyCODONE (OXY IR/ROXICODONE) 5 MG immediate release tablet Take 5 mg by mouth every 6 (six) hours as needed for moderate pain.     [provider]  potassium chloride SA (K-DUR,KLOR-CON) 20 MEQ tablet Take 1 tablet (20 mEq total) by mouth daily. 03/20/18   Oval Linsey, MD  Skin Protectants, Misc. (EUCERIN) cream Apply 1 application topically as needed for dry skin.    [provider]    Allergies    Patient has no known allergies.  Review of Systems   Review of Systems  Constitutional: Negative for activity change, appetite change, chills and fever.  HENT: Negative for facial swelling, sore throat and trouble swallowing.   Respiratory: Negative for chest tightness, shortness of breath and wheezing.   Cardiovascular: Negative for chest pain and leg swelling.  Gastrointestinal: Negative for nausea and vomiting.  Musculoskeletal: Negative for arthralgias and myalgias.  Skin: Positive for rash. Negative for wound.  Neurological: Negative for dizziness, weakness, numbness and headaches.    Physical Exam Updated Vital Signs BP (!) 154/95 (BP Location: Right Arm)   Pulse (!) 110   Temp 98.1 F (36.7 C) (Oral)   Resp 20   Ht 5\' 7"  (1.702 m)   Wt 112.5 kg   SpO2 100%   BMI 38.84 kg/m   Physical Exam Vitals and nursing note reviewed.  Constitutional:      General: He is not in acute distress.    Appearance: Normal appearance. He is well-developed. He is obese. He is not ill-appearing.  HENT:     Head: Normocephalic.  Cardiovascular:     Rate and Rhythm: Normal rate and regular rhythm.     Pulses: Normal pulses.  Pulmonary:     Effort: Pulmonary effort is normal. No respiratory distress.     Breath sounds: Normal breath sounds.  Abdominal:     General: There is no distension.     Palpations: Abdomen is soft.     Tenderness: There is no abdominal tenderness.  Musculoskeletal:        General: No tenderness or signs of injury. Normal range of motion.     Cervical back: Normal range of motion and neck supple.  Lymphadenopathy:     Cervical: No cervical adenopathy.  Skin:     General: Skin is warm.     Capillary Refill: Capillary refill takes less than 2 seconds.     Findings: Erythema and rash present.     Comments: Scaly, large plaques to bilateral elbows, forearms of upper buttocks.    Neurological:     General: No focal deficit present.     Mental Status: He is alert and oriented to person, place, and time.     Sensory: No sensory deficit.     Motor: No weakness or abnormal muscle tone.     ED Results / Procedures / Treatments  Labs (all labs ordered are listed, but only abnormal results are displayed) Labs Reviewed - No data to display  EKG None  Radiology No results found.  Procedures Procedures (including critical care time)  Medications Ordered in ED Medications  ketorolac (TORADOL) injection 60 mg (60 mg Intramuscular Given 05/02/20 1318)  lisinopril (ZESTRIL) tablet 20 mg (20 mg Oral Given 05/02/20 1318)    ED Course  I have reviewed the triage vital signs and the nursing notes.  Pertinent labs & imaging results that were available during my care of the patient were reviewed by me and considered in my medical decision making (see chart for details).    MDM Rules/Calculators/A&P                          Pt with hx of psoriasis. Likely exacerbation.  No concerning sx's for systemic sx's.  Offered steroid, but pt has steroid at home.   Pt hypertensive w/o associated sx's.  Currently taking lisinopril, but hasn't taken it today.  Routine dose given here, on recheck, BP improving.  Appears appropriate for d/c home.  Agrees to close out patient f/u with PCP.     Final Clinical Impression(s) / ED Diagnoses Final diagnoses:  Psoriasis  Hypertension, unspecified type    Rx / DC Orders ED Discharge Orders    None       Pauline Aus, PA-C 05/05/20 1515    Terrilee Files, MD 05/05/20 1754

## 2022-05-13 ENCOUNTER — Encounter (HOSPITAL_COMMUNITY): Payer: Self-pay

## 2022-05-13 ENCOUNTER — Other Ambulatory Visit: Payer: Self-pay

## 2022-05-13 ENCOUNTER — Emergency Department (HOSPITAL_COMMUNITY)
Admission: EM | Admit: 2022-05-13 | Discharge: 2022-05-13 | Disposition: A | Discharge status: Home / Self Care | Payer: Medicaid Other | Attending: Emergency Medicine | Admitting: Emergency Medicine

## 2022-05-13 DIAGNOSIS — M25512 Pain in left shoulder: Secondary | ICD-10-CM | POA: Insufficient documentation

## 2022-05-13 DIAGNOSIS — Z7982 Long term (current) use of aspirin: Secondary | ICD-10-CM | POA: Insufficient documentation

## 2022-05-13 DIAGNOSIS — G8929 Other chronic pain: Secondary | ICD-10-CM | POA: Insufficient documentation

## 2022-05-13 MED ORDER — KETOROLAC TROMETHAMINE 60 MG/2ML IM SOLN
60.0000 mg | Freq: Once | INTRAMUSCULAR | Status: AC
  Administered 2022-05-13: 60 mg via INTRAMUSCULAR
  Filled 2022-05-13: qty 2

## 2022-05-13 NOTE — ED Triage Notes (Signed)
Patient here via POV with complaints of left shoulder pain for the past four months. Currently sees ortho and is supposed to follow-up with them on Monday for sx date. Patient states that he is here today because she is refusing to give him medication for pain until after sx. States that pain is unchanged, no chest pain, etc.

## 2022-05-13 NOTE — ED Notes (Addendum)
Provider made aware of pt BP

## 2022-05-13 NOTE — ED Provider Notes (Signed)
[facility] EMERGENCY DEPARTMENT Provider Note   CSN: [id number] Arrival date & time: 05/13/22  0900     History  Chief Complaint  Patient presents with   Shoulder Pain    [name] is a 56 y.o. male.  Pt reports he is suppose to have shoulder surgery in [place].  Pt reports no relief from tramadol.  Pt is in pain mangement.  Pt recently received 120 tramadol on 7/25.  Pt has had to stop butrans patch because it caused redness   The history is provided by the patient. No language interpreter was used.  Shoulder Pain Location:  Shoulder Shoulder location:  R shoulder Injury: no   Pain details:    Quality:  Aching   Radiates to:  Does not radiate   Severity:  Moderate   Onset quality:  Gradual   Duration:  3 months   Timing:  Constant   Progression:  Worsening Dislocation: no   Prior injury to area:  No Worsened by:  Nothing Ineffective treatments:  None tried Associated symptoms: no fever   Risk factors: no concern for non-accidental trauma        Home Medications Prior to Admission medications   Medication Sig Start Date End Date Taking? Authorizing Provider  albuterol (PROVENTIL HFA;VENTOLIN HFA) 108 (90 BASE) MCG/ACT inhaler Inhale 2 puffs into the lungs 4 (four) times daily as needed for wheezing or shortness of breath.     [provider]  albuterol (PROVENTIL) (2.5 MG/3ML) 0.083% nebulizer solution Inhale 3 mLs into the lungs 4 (four) times daily as needed for wheezing or shortness of breath. 03/19/18   Oval Linsey, MD  amLODipine (NORVASC) 5 MG tablet Take 1 tablet (5 mg total) by mouth daily. 03/20/18   Oval Linsey, MD  aspirin EC 325 MG tablet Take 1 tablet (325 mg total) by mouth daily. 09/25/18   Gerhard Munch, MD  atorvastatin (LIPITOR) 40 MG tablet Take 40 mg by mouth daily.    [provider]  cholecalciferol (VITAMIN D) 1000 units tablet Take 5,000 Units by mouth daily.    [provider]  colchicine  0.6 MG tablet Take two tablets for first dose, then one tablet one hour later for a max dose of 1.8mg  on the first day.  Then take one tablet twice daily for 3 days. 02/23/20   Triplett, Tammy, PA-C  hydrOXYzine (ATARAX/VISTARIL) 25 MG tablet Take 1 tablet (25 mg total) by mouth every 6 (six) hours as needed for itching. 03/15/15   Triplett, Tammy, PA-C  levothyroxine (SYNTHROID, LEVOTHROID) 50 MCG tablet Take 50 mcg by mouth daily before breakfast.    [provider]  lisinopril (PRINIVIL,ZESTRIL) 20 MG tablet Take 20 mg by mouth daily.    [provider]  naproxen (NAPROSYN) 500 MG tablet Take 500 mg by mouth daily.     [provider]  omeprazole (PRILOSEC) 20 MG capsule Take 20 mg by mouth daily as needed (for acid reflux).     [provider]  oxyCODONE (OXY IR/ROXICODONE) 5 MG immediate release tablet Take 5 mg by mouth every 6 (six) hours as needed for moderate pain.    [provider]  potassium chloride SA (K-DUR,KLOR-CON) 20 MEQ tablet Take 1 tablet (20 mEq total) by mouth daily. 03/20/18   Oval Linsey, MD  Skin Protectants, Misc. (EUCERIN) cream Apply 1 application topically as needed for dry skin.    [provider]      Allergies    Patient  has no known allergies.    Review of Systems   Review of Systems  Constitutional:  Negative for fever.  All other systems reviewed and are negative.   Physical Exam Updated Vital Signs BP (!) 175/116 (BP Location: Right Arm)   Pulse 96   Temp 98.1 F (36.7 C)   Resp 18   Ht 5\' 7"  (1.702 m)   Wt 112.5 kg   SpO2 97%   BMI 38.85 kg/m  Physical Exam Vitals and nursing note reviewed.  Constitutional:      Appearance: He is well-developed.  HENT:     Head: Normocephalic.  Cardiovascular:     Rate and Rhythm: Normal rate.  Pulmonary:     Effort: Pulmonary effort is normal.  Abdominal:     General: There is no distension.  Musculoskeletal:        General: Tenderness present.  No swelling or deformity.     Cervical back: Normal range of motion.  Skin:    General: Skin is warm.  Neurological:     General: No focal deficit present.     Mental Status: He is alert and oriented to person, place, and time.  Psychiatric:        Mood and Affect: Mood normal.     ED Results / Procedures / Treatments   Labs (all labs ordered are listed, but only abnormal results are displayed) Labs Reviewed - No data to display  EKG None  Radiology No results found.  Procedures Procedures    Medications Ordered in ED Medications  ketorolac (TORADOL) injection 60 mg (has no administration in time range)    ED Course/ Medical Decision Making/ A&P                           Medical Decision Making Chronic shoulder pain, Pt is suppose to have surgery   Amount and/or Complexity of Data Reviewed External Data Reviewed: notes.    Details: primary care notes reviewed  Risk Prescription drug management. Risk Details: Pt given an injection of toradol.  Pt advised he needs to see his MD for recheck    MDM:  Pt counseled on pain management.  I will give pt an injection of tramadol.  Pt advised he needs to discuss pain mangement with his MD         Final Clinical Impression(s) / ED Diagnoses Final diagnoses:  Chronic left shoulder pain    Rx / DC Orders ED Discharge Orders     None     An After Visit Summary was printed and given to the patient.     , Elson Areas 05/13/22 1005    05/15/22, MD 05/14/22 [phone]

## 2022-08-09 ENCOUNTER — Encounter (HOSPITAL_COMMUNITY): Payer: Self-pay

## 2022-08-09 ENCOUNTER — Emergency Department (HOSPITAL_COMMUNITY): Payer: Medicaid Other

## 2022-08-09 ENCOUNTER — Other Ambulatory Visit: Payer: Self-pay

## 2022-08-09 ENCOUNTER — Emergency Department (HOSPITAL_COMMUNITY)
Admission: EM | Admit: 2022-08-09 | Discharge: 2022-08-10 | Disposition: A | Discharge status: Home / Self Care | Payer: Medicaid Other | Attending: Emergency Medicine | Admitting: Emergency Medicine

## 2022-08-09 DIAGNOSIS — R4182 Altered mental status, unspecified: Secondary | ICD-10-CM | POA: Diagnosis not present

## 2022-08-09 DIAGNOSIS — F10220 Alcohol dependence with intoxication, uncomplicated: Secondary | ICD-10-CM | POA: Insufficient documentation

## 2022-08-09 DIAGNOSIS — Z79899 Other long term (current) drug therapy: Secondary | ICD-10-CM | POA: Insufficient documentation

## 2022-08-09 DIAGNOSIS — Z7982 Long term (current) use of aspirin: Secondary | ICD-10-CM | POA: Diagnosis not present

## 2022-08-09 DIAGNOSIS — F10920 Alcohol use, unspecified with intoxication, uncomplicated: Secondary | ICD-10-CM

## 2022-08-09 LAB — RAPID URINE DRUG SCREEN, HOSP PERFORMED
Amphetamines: NOT DETECTED
Barbiturates: NOT DETECTED
Benzodiazepines: NOT DETECTED
Cocaine: NOT DETECTED
Opiates: NOT DETECTED
Tetrahydrocannabinol: NOT DETECTED

## 2022-08-09 LAB — COMPREHENSIVE METABOLIC PANEL
ALT: 23 U/L (ref 0–44)
AST: 24 U/L (ref 15–41)
Albumin: 3.7 g/dL (ref 3.5–5.0)
Alkaline Phosphatase: 152 U/L — ABNORMAL HIGH (ref 38–126)
Anion gap: 12 (ref 5–15)
BUN: <5 mg/dL — ABNORMAL LOW (ref 6–20)
CO2: 21 mmol/L — ABNORMAL LOW (ref 22–32)
Calcium: 9.2 mg/dL (ref 8.9–10.3)
Chloride: 110 mmol/L (ref 98–111)
Creatinine, Ser: 0.66 mg/dL (ref 0.61–1.24)
GFR, Estimated: >60 mL/min (ref >60)
Glucose, Bld: 122 mg/dL — ABNORMAL HIGH (ref 70–99)
Potassium: 3.2 mmol/L — ABNORMAL LOW (ref 3.5–5.1)
Sodium: 143 mmol/L (ref 135–145)
Total Bilirubin: 0.2 mg/dL — ABNORMAL LOW (ref 0.3–1.2)
Total Protein: 7 g/dL (ref 6.5–8.1)

## 2022-08-09 LAB — CBC
HCT: 49.2 % (ref 39.0–52.0)
Hemoglobin: 16.8 g/dL (ref 13.0–17.0)
MCH: 31.2 pg (ref 26.0–34.0)
MCHC: 34.1 g/dL (ref 30.0–36.0)
MCV: 91.3 fL (ref 80.0–100.0)
Platelets: 459 10*3/uL — ABNORMAL HIGH (ref 150–400)
RBC: 5.39 MIL/uL (ref 4.22–5.81)
RDW: 12.9 % (ref 11.5–15.5)
WBC: 13.7 10*3/uL — ABNORMAL HIGH (ref 4.0–10.5)
nRBC: 0 % (ref 0.0–0.2)

## 2022-08-09 LAB — CBG MONITORING, ED: Glucose-Capillary: 139 mg/dL — ABNORMAL HIGH (ref 70–99)

## 2022-08-09 LAB — ETHANOL: Alcohol, Ethyl (B): 246 mg/dL — ABNORMAL HIGH (ref <10)

## 2022-08-09 MED ORDER — LACTATED RINGERS IV BOLUS
500.0000 mL | Freq: Once | INTRAVENOUS | Status: AC
  Administered 2022-08-09: 500 mL via INTRAVENOUS
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
## 2022-08-09 NOTE — ED Notes (Signed)
[name] [phone] wants an update asap
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
## 2022-08-10 MED ORDER — TRAMADOL HCL 50 MG PO TABS
50.0000 mg | ORAL_TABLET | Freq: Once | ORAL | Status: AC
  Administered 2022-08-10: 50 mg via ORAL
  Filled 2022-08-10: qty 1

## 2022-08-10 MED ORDER — IBUPROFEN 800 MG PO TABS
800.0000 mg | ORAL_TABLET | Freq: Once | ORAL | Status: AC
  Administered 2022-08-10: 800 mg via ORAL
  Filled 2022-08-10: qty 1
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
## 2022-08-10 NOTE — ED Notes (Signed)
Pt getting dressed and removed all of his monitoring

## 2022-08-10 NOTE — ED Notes (Signed)
Pt is more alert at this time and complaining of shoulder, hip and back pain. States he hasn't taken his home meds in several days because his doctor is going to take him off them.   Pt states he does not remember what brought him to the ED or how he got here.

## 2023-07-27 ENCOUNTER — Ambulatory Visit: Payer: Medicaid Other | Attending: Physician Assistant

## 2023-07-27 ENCOUNTER — Other Ambulatory Visit: Payer: Self-pay

## 2023-07-27 DIAGNOSIS — M5459 Other low back pain: Secondary | ICD-10-CM | POA: Diagnosis present

## 2023-07-27 DIAGNOSIS — M6281 Muscle weakness (generalized): Secondary | ICD-10-CM | POA: Insufficient documentation

## 2023-07-27 DIAGNOSIS — M961 Postlaminectomy syndrome, not elsewhere classified: Secondary | ICD-10-CM | POA: Insufficient documentation

## 2023-07-27 NOTE — Therapy (Signed)
OUTPATIENT PHYSICAL THERAPY THORACOLUMBAR EVALUATION   Patient Name: [name] MRN: [id number] DOB:1965/10/29, 57 y.o., male Today's Date: 07/27/2023  END OF SESSION:  PT End of Session - 07/27/23 1529     Visit Number 1    Number of Visits 7    Date for PT Re-Evaluation 09/26/23    Authorization Type [place] [facility]    PT Start Time 1445    PT Stop Time 1530    PT Time Calculation (min) 45 min    Activity Tolerance Patient tolerated treatment well    Behavior During Therapy WFL for tasks assessed/performed             Past Medical History:  Diagnosis Date   Acid reflux disease    Arthritis    Asthma    Gout    Headache    High blood pressure    High cholesterol    Psoriasis    Past Surgical History:  Procedure Laterality Date   JOINT REPLACEMENT     two total hip replacements   MULTIPLE EXTRACTIONS WITH ALVEOLOPLASTY N/A 06/19/2017   Procedure: MULTIPLE EXTRACTION WITH ALVEOLOPLASTY;  Surgeon: Ocie Doyne, DDS;  Location: [facility] OR;  Service: Oral Surgery;  Laterality: N/A;   SPINAL FUSION     TOTAL HIP ARTHROPLASTY     Patient Active Problem List   Diagnosis Date Noted   Bilateral lower leg cellulitis 03/16/2018   HLD (hyperlipidemia) 03/16/2018   Obesity, Class III, BMI 40-49.9 (morbid obesity) (HCC) 03/16/2018   Psoriasis 03/16/2018   GERD (gastroesophageal reflux disease) 03/16/2018   Asthma 03/16/2018   Hypothyroidism 03/16/2018   Aseptic necrosis of bone (HCC) 09/16/2007   Benign essential HTN 08/17/2007    PCP: Pcp, No   REFERRING PROVIDER: Jamey Reas, PA-C  REFERRING DIAG: M96.1 (ICD-10-CM) - Postlaminectomy syndrome, not elsewhere classified  Rationale for Evaluation and Treatment: Rehabilitation  THERAPY DIAG:  Other low back pain  Muscle weakness (generalized)  Failed back syndrome of lumbar spine  ONSET DATE: chronic  SUBJECTIVE:                                                                                                                                                                                            SUBJECTIVE STATEMENT: Chronic low back and LE pain over 10 years.  Gets relief from meds and rest, does not exercise regularly.  Denies radicular symptoms.  PERTINENT HISTORY:  Referred to OPPT for chronic low back pain ongoing since lumbar fusion 2014.  B hip replacements 2012.  PAIN:  Are you having pain? Yes: NPRS scale: 10/10 Pain location: low back and BLE's  Pain description: ache  Aggravating factors: activity, walking Relieving factors: medication ,lying supine  PRECAUTIONS: Back fusion L5-S1  RED FLAGS: None   WEIGHT BEARING RESTRICTIONS: No  FALLS:  Has patient fallen in last 6 months? No  OCCUPATION: disabled  PLOF: Independent  PATIENT GOALS: To manage my back pain and symptoms  NEXT MD VISIT: 07/2023  OBJECTIVE:  Note: Objective measures were completed at Evaluation unless otherwise noted.  DIAGNOSTIC FINDINGS:  Narrative & Impression  CLINICAL DATA:  Low back pain, weakness for 1 month   EXAM: MRI LUMBAR SPINE WITHOUT CONTRAST   TECHNIQUE: Multiplanar, multisequence MR imaging of the lumbar spine was performed. No intravenous contrast was administered.   COMPARISON:  04/14/2013   FINDINGS: Segmentation:  Standard.   Alignment:  Grade 1 anterolisthesis of L5 on S1.   Vertebrae:  No fracture, evidence of discitis, or bone lesion.   Conus medullaris and cauda equina: Conus extends to the T12 level. Conus and cauda equina appear normal.   Paraspinal and other soft tissues: No acute paraspinal abnormality.   Disc levels:   Disc spaces: Posterior lumbar fusion at L5-S1.   T12-L1: No significant disc bulge. No evidence of neural foraminal stenosis. No central canal stenosis.   L1-L2: Minimal broad-based disc bulge. No evidence of neural foraminal stenosis. No central canal stenosis.   L2-L3: No significant disc bulge. No evidence of neural  foraminal stenosis. No central canal stenosis.   L3-L4: Minimal broad-based disc bulge. No evidence of neural foraminal stenosis. No central canal stenosis.   L4-L5: No significant disc bulge. No evidence of neural foraminal stenosis. No central canal stenosis.   L5-S1: Interbody fusion. Severe bilateral foraminal stenosis. No central canal stenosis.   IMPRESSION: 1. Posterior lumbar interbody fusion at L5-S1 with severe bilateral foraminal stenosis.     Electronically Signed   By: Elige Ko   On: 09/15/2018 10:23    PATIENT SURVEYS:  FOTO 30(40 predicted)  MUSCLE LENGTH: Hamstrings: Right 80 deg; Left 70 deg  POSTURE: increased lumbar lordosis  PALPATION: Not tested  LUMBAR ROM:   AROM eval  Flexion 50%  Extension 50%  Right lateral flexion 50%  Left lateral flexion 50%  Right rotation   Left rotation    (Blank rows = not tested)  LOWER EXTREMITY ROM:   WFL for B THA's  Active  Right eval Left eval  Hip flexion    Hip extension    Hip abduction    Hip adduction    Hip internal rotation    Hip external rotation    Knee flexion    Knee extension    Ankle dorsiflexion    Ankle plantarflexion    Ankle inversion    Ankle eversion     (Blank rows = not tested)  LOWER EXTREMITY MMT:    MMT Right eval Left eval  Hip flexion 4+ 4+  Hip extension 4+ 4+  Hip abduction 4+ 4+  Hip adduction    Hip internal rotation    Hip external rotation    Knee flexion 4+ 4+  Knee extension 4+ 4+  Ankle dorsiflexion    Ankle plantarflexion 4+ 4+  Ankle inversion    Ankle eversion     (Blank rows = not tested)  LUMBAR SPECIAL TESTS:  Straight leg raise test: Negative and Slump test: Negative  FUNCTIONAL TESTS:  30 seconds chair stand test 22 arms   GAIT: Distance walked: 18ft x2 Assistive device utilized: None Level of assistance: Complete Independence Comments:  slow cadence  TODAY'S TREATMENT:                                                                                                                               DATE: 07/27/23 Eval     PATIENT EDUCATION:  Education details: Discussed eval findings, rehab rationale and POC and patient is in agreement  Person educated: Patient Education method: Explanation Education comprehension: verbalized understanding  HOME EXERCISE PROGRAM: Access Code: [email or web address] URL: [email or web address] Date: 07/27/2023 Prepared by: Gustavus Bryant  Exercises - Sit to Stand with Arms Crossed  - 2 x daily - 5 x weekly - 1 sets - 10 reps  ASSESSMENT:  CLINICAL IMPRESSION: Patient is a 57 y.o. male who was seen today for physical therapy evaluation and treatment for chronic low back pain. Patient presents with good LE strength and mobility, negative neuro tension signs and a good 30s chair stand test score.  Patient main deficits are core weakness and lack of endurance and activity tolerance.  Recommend 1w6 OPPT to establish HEP due to limited need of skilled PT.  OBJECTIVE IMPAIRMENTS: Abnormal gait, decreased activity tolerance, decreased endurance, decreased knowledge of condition, decreased mobility, difficulty walking, decreased strength, increased fascial restrictions, improper body mechanics, obesity, and pain.   ACTIVITY LIMITATIONS: carrying, lifting, bending, sitting, standing, and squatting  PERSONAL FACTORS: Age, Education, Fitness, and 3+ comorbidities: B THA, spinal fusion, DM  are also affecting patient's functional outcome.   REHAB POTENTIAL: Good  CLINICAL DECISION MAKING: Stable/uncomplicated  EVALUATION COMPLEXITY: Low   GOALS: Goals reviewed with patient? No  SHORT TERM GOALS=LONG TERM GOALS: Target date: 09/21/2023    Patient to demonstrate independence in HEP  Baseline: [id number] Goal status: INITIAL  2.  Increase trunk mobility to 66% Baseline:  AROM eval  Flexion 50%  Extension 50%  Right lateral flexion 50%  Left lateral flexion 50%   Goal  status: INITIAL  3.  Patient will score at least 40% on FOTO to signify clinically meaningful improvement in functional abilities.   Baseline: 30% Goal status: INITIAL  4.  Patient to acknowledge <10/10 pain at least once during episode of care Baseline: 10/10 Goal status: INITIAL    PLAN:  PT FREQUENCY: 1-2x/week  PT DURATION: 6 weeks  PLANNED INTERVENTIONS: 97164- PT Re-evaluation, 97110-Therapeutic exercises, 97530- Therapeutic activity, 97112- Neuromuscular re-education, 97535- Self Care, 40981- Manual therapy, [id number]- Gait training, 97014- Electrical stimulation (unattended), Spinal mobilization, Cryotherapy, and Moist heat.  PLAN FOR NEXT SESSION: HEP review and update, manual techniques as appropriate, aerobic tasks, ROM and flexibility activities, strengthening and PREs, TPDN, gait and balance training as needed    For all possible CPT codes, reference the Planned Interventions line above.     Check all conditions that are expected to impact treatment: {Conditions expected to impact treatment:Morbid obesity, Diabetes mellitus, and Uncorrected hearing or vision impairment   If treatment provided at initial evaluation, no treatment charged due to lack of authorization.  Hildred Laser, PT 07/27/2023, 3:48 PM

## 2023-07-27 NOTE — Therapy (Deleted)
OUTPATIENT PHYSICAL THERAPY THORACOLUMBAR EVALUATION   Patient Name: [name] MRN: [id number] DOB:01/26/1966, 57 y.o., male Today's Date: 07/27/2023  END OF SESSION:   Past Medical History:  Diagnosis Date   Acid reflux disease    Arthritis    Asthma    Gout    Headache    High blood pressure    High cholesterol    Psoriasis    Past Surgical History:  Procedure Laterality Date   JOINT REPLACEMENT     two total hip replacements   MULTIPLE EXTRACTIONS WITH ALVEOLOPLASTY N/A 06/19/2017   Procedure: MULTIPLE EXTRACTION WITH ALVEOLOPLASTY;  Surgeon: Ocie Doyne, DDS;  Location: [facility] OR;  Service: Oral Surgery;  Laterality: N/A;   SPINAL FUSION     TOTAL HIP ARTHROPLASTY     Patient Active Problem List   Diagnosis Date Noted   Bilateral lower leg cellulitis 03/16/2018   HLD (hyperlipidemia) 03/16/2018   Obesity, Class III, BMI 40-49.9 (morbid obesity) (HCC) 03/16/2018   Psoriasis 03/16/2018   GERD (gastroesophageal reflux disease) 03/16/2018   Asthma 03/16/2018   Hypothyroidism 03/16/2018   Aseptic necrosis of bone (HCC) 09/16/2007   Benign essential HTN 08/17/2007    PCP: Pcp, No  REFERRING PROVIDER: Jamey Reas, PA-C  REFERRING DIAG: M96.1 (ICD-10-CM) - Postlaminectomy syndrome, not elsewhere classified  Rationale for Evaluation and Treatment: Rehabilitation  THERAPY DIAG:  No diagnosis found.  ONSET DATE: ***  SUBJECTIVE:                                                                                                                                                                                           SUBJECTIVE STATEMENT: ***  PERTINENT HISTORY:  ***  PAIN:  Are you having pain? {OPRCPAIN:27236}  PRECAUTIONS: {Therapy precautions:24002}  RED FLAGS: {PT Red Flags:29287}   WEIGHT BEARING RESTRICTIONS: {Yes ***/No:24003}  FALLS:  Has patient fallen in last 6 months? {fallsyesno:27318}  LIVING ENVIRONMENT: Lives with: {[facility]  lives with:25569::"lives with their family"} Lives in: {Lives in:25570} Stairs: {opstairs:27293} Has following equipment at home: {Assistive devices:23999}  OCCUPATION: ***  PLOF: {PLOF:24004}  PATIENT GOALS: ***  NEXT MD VISIT: ***  OBJECTIVE:  Note: Objective measures were completed at Evaluation unless otherwise noted.  DIAGNOSTIC FINDINGS:  ***  PATIENT SURVEYS:  {rehab surveys:24030}  SCREENING FOR RED FLAGS: Bowel or bladder incontinence: {Yes/No:[id number]} Spinal tumors: {Yes/No:304960894} Cauda equina syndrome: {Yes/No:304960894} Compression fracture: {Yes/No:304960894} Abdominal aneurysm: {Yes/No:304960894}  COGNITION: Overall cognitive status: {cognition:24006}     SENSATION: {sensation:27233}  MUSCLE LENGTH: Hamstrings: Right *** deg; Left *** deg Thomas test: Right *** deg; Left *** deg  POSTURE: {posture:25561}  PALPATION: ***  LUMBAR ROM:   AROM eval  Flexion   Extension   Right lateral flexion   Left lateral flexion   Right rotation   Left rotation    (Blank rows = not tested)  LOWER EXTREMITY ROM:     {AROM/PROM:27142}  Right eval Left eval  Hip flexion    Hip extension    Hip abduction    Hip adduction    Hip internal rotation    Hip external rotation    Knee flexion    Knee extension    Ankle dorsiflexion    Ankle plantarflexion    Ankle inversion    Ankle eversion     (Blank rows = not tested)  LOWER EXTREMITY MMT:    MMT Right eval Left eval  Hip flexion    Hip extension    Hip abduction    Hip adduction    Hip internal rotation    Hip external rotation    Knee flexion    Knee extension    Ankle dorsiflexion    Ankle plantarflexion    Ankle inversion    Ankle eversion     (Blank rows = not tested)  LUMBAR SPECIAL TESTS:  {lumbar special test:25242}  FUNCTIONAL TESTS:  {Functional tests:24029}  GAIT: Distance walked: *** Assistive device utilized: {Assistive devices:23999} Level of assistance:  {Levels of assistance:24026} Comments: ***  TODAY'S TREATMENT:                                                                                                                              DATE: ***    PATIENT EDUCATION:  Education details: *** Person educated: {Person educated:25204} Education method: {Education Method:25205} Education comprehension: {Education Comprehension:25206}  HOME EXERCISE PROGRAM: ***  ASSESSMENT:  CLINICAL IMPRESSION: Patient is a *** y.o. *** who was seen today for physical therapy evaluation and treatment for ***.   OBJECTIVE IMPAIRMENTS: {opptimpairments:25111}.   ACTIVITY LIMITATIONS: {activitylimitations:27494}  PARTICIPATION LIMITATIONS: {participationrestrictions:25113}  PERSONAL FACTORS: {Personal factors:25162} are also affecting patient's functional outcome.   REHAB POTENTIAL: {rehabpotential:25112}  CLINICAL DECISION MAKING: {clinical decision making:25114}  EVALUATION COMPLEXITY: {Evaluation complexity:25115}   GOALS: Goals reviewed with patient? {yes/no:20286}  SHORT TERM GOALS: Target date: ***  *** Baseline: Goal status: INITIAL  2.  *** Baseline:  Goal status: INITIAL  3.  *** Baseline:  Goal status: INITIAL  4.  *** Baseline:  Goal status: INITIAL  5.  *** Baseline:  Goal status: INITIAL  6.  *** Baseline:  Goal status: INITIAL  LONG TERM GOALS: Target date: ***  *** Baseline:  Goal status: INITIAL  2.  *** Baseline:  Goal status: INITIAL  3.  *** Baseline:  Goal status: INITIAL  4.  *** Baseline:  Goal status: INITIAL  5.  *** Baseline:  Goal status: INITIAL  6.  *** Baseline:  Goal status: INITIAL  PLAN:  PT FREQUENCY: {rehab frequency:25116}  PT DURATION: {rehab duration:25117}  PLANNED INTERVENTIONS: {rehab planned interventions:25118::"97110-Therapeutic exercises","97530- Therapeutic  [phone]- Neuromuscular re-education","97535- Self JXBJ","47829- Manual  therapy"}.  PLAN FOR NEXT SESSION: ***   Naman Spychalski April Ma L Rawleigh Rode, PT 07/27/2023, 10:03 AM

## 2023-08-05 ENCOUNTER — Ambulatory Visit: Payer: Medicaid Other

## 2023-08-12 ENCOUNTER — Ambulatory Visit: Payer: Medicaid Other | Attending: Physician Assistant

## 2023-08-12 DIAGNOSIS — M5459 Other low back pain: Secondary | ICD-10-CM | POA: Insufficient documentation

## 2023-08-12 DIAGNOSIS — M961 Postlaminectomy syndrome, not elsewhere classified: Secondary | ICD-10-CM | POA: Insufficient documentation

## 2023-08-12 DIAGNOSIS — M6281 Muscle weakness (generalized): Secondary | ICD-10-CM | POA: Insufficient documentation

## 2023-08-12 NOTE — Therapy (Addendum)
OUTPATIENT PHYSICAL THERAPY TREATMENT NOTE/DISCHARGE   Patient Name: [name] MRN: [id number] DOB:1965/10/26, 57 y.o., male Today's Date: 08/12/2023  END OF SESSION:  PT End of Session - 08/12/23 1047     Visit Number 2    Number of Visits 7    Date for PT Re-Evaluation 09/26/23    Authorization Type [place] [facility]    PT Start Time 1050    PT Stop Time 1130    PT Time Calculation (min) 40 min    Activity Tolerance Patient tolerated treatment well    Behavior During Therapy WFL for tasks assessed/performed             Past Medical History:  Diagnosis Date   Acid reflux disease    Arthritis    Asthma    Gout    Headache    High blood pressure    High cholesterol    Psoriasis    Past Surgical History:  Procedure Laterality Date   JOINT REPLACEMENT     two total hip replacements   MULTIPLE EXTRACTIONS WITH ALVEOLOPLASTY N/A 06/19/2017   Procedure: MULTIPLE EXTRACTION WITH ALVEOLOPLASTY;  Surgeon: Ocie Doyne, DDS;  Location: [facility] OR;  Service: Oral Surgery;  Laterality: N/A;   SPINAL FUSION     TOTAL HIP ARTHROPLASTY     Patient Active Problem List   Diagnosis Date Noted   Bilateral lower leg cellulitis 03/16/2018   HLD (hyperlipidemia) 03/16/2018   Obesity, Class III, BMI 40-49.9 (morbid obesity) (HCC) 03/16/2018   Psoriasis 03/16/2018   GERD (gastroesophageal reflux disease) 03/16/2018   Asthma 03/16/2018   Hypothyroidism 03/16/2018   Aseptic necrosis of bone (HCC) 09/16/2007   Benign essential HTN 08/17/2007    PCP: Pcp, No   REFERRING PROVIDER: Jamey Reas, PA-C  REFERRING DIAG: M96.1 (ICD-10-CM) - Postlaminectomy syndrome, not elsewhere classified  Rationale for Evaluation and Treatment: Rehabilitation  THERAPY DIAG:  Other low back pain  Muscle weakness (generalized)  Failed back syndrome of lumbar spine  ONSET DATE: chronic  SUBJECTIVE:                                                                                                                                                                                            SUBJECTIVE STATEMENT: patient returns for first f/u session.  Low back pain rated at 3/10 in intensity  PERTINENT HISTORY:  Referred to OPPT for chronic low back pain ongoing since lumbar fusion 2014.  B hip replacements 2012.  PAIN:  Are you having pain? Yes: NPRS scale: 10/10 Pain location: low back and BLE's Pain description: ache  Aggravating factors: activity, walking Relieving  factors: medication ,lying supine  PRECAUTIONS: Back fusion L5-S1  RED FLAGS: None   WEIGHT BEARING RESTRICTIONS: No  FALLS:  Has patient fallen in last 6 months? No  OCCUPATION: disabled  PLOF: Independent  PATIENT GOALS: To manage my back pain and symptoms  NEXT MD VISIT: 07/2023  OBJECTIVE:  Note: Objective measures were completed at Evaluation unless otherwise noted.  DIAGNOSTIC FINDINGS:  Narrative & Impression  CLINICAL DATA:  Low back pain, weakness for 1 month   EXAM: MRI LUMBAR SPINE WITHOUT CONTRAST   TECHNIQUE: Multiplanar, multisequence MR imaging of the lumbar spine was performed. No intravenous contrast was administered.   COMPARISON:  04/14/2013   FINDINGS: Segmentation:  Standard.   Alignment:  Grade 1 anterolisthesis of L5 on S1.   Vertebrae:  No fracture, evidence of discitis, or bone lesion.   Conus medullaris and cauda equina: Conus extends to the T12 level. Conus and cauda equina appear normal.   Paraspinal and other soft tissues: No acute paraspinal abnormality.   Disc levels:   Disc spaces: Posterior lumbar fusion at L5-S1.   T12-L1: No significant disc bulge. No evidence of neural foraminal stenosis. No central canal stenosis.   L1-L2: Minimal broad-based disc bulge. No evidence of neural foraminal stenosis. No central canal stenosis.   L2-L3: No significant disc bulge. No evidence of neural foraminal stenosis. No central canal stenosis.   L3-L4:  Minimal broad-based disc bulge. No evidence of neural foraminal stenosis. No central canal stenosis.   L4-L5: No significant disc bulge. No evidence of neural foraminal stenosis. No central canal stenosis.   L5-S1: Interbody fusion. Severe bilateral foraminal stenosis. No central canal stenosis.   IMPRESSION: 1. Posterior lumbar interbody fusion at L5-S1 with severe bilateral foraminal stenosis.     Electronically Signed   By: Elige Ko   On: 09/15/2018 10:23    PATIENT SURVEYS:  FOTO 30(40 predicted)  MUSCLE LENGTH: Hamstrings: Right 80 deg; Left 70 deg  POSTURE: increased lumbar lordosis  PALPATION: Not tested  LUMBAR ROM:   AROM eval  Flexion 50%  Extension 50%  Right lateral flexion 50%  Left lateral flexion 50%  Right rotation   Left rotation    (Blank rows = not tested)  LOWER EXTREMITY ROM:   WFL for B THA's  Active  Right eval Left eval  Hip flexion    Hip extension    Hip abduction    Hip adduction    Hip internal rotation    Hip external rotation    Knee flexion    Knee extension    Ankle dorsiflexion    Ankle plantarflexion    Ankle inversion    Ankle eversion     (Blank rows = not tested)  LOWER EXTREMITY MMT:    MMT Right eval Left eval  Hip flexion 4+ 4+  Hip extension 4+ 4+  Hip abduction 4+ 4+  Hip adduction    Hip internal rotation    Hip external rotation    Knee flexion 4+ 4+  Knee extension 4+ 4+  Ankle dorsiflexion    Ankle plantarflexion 4+ 4+  Ankle inversion    Ankle eversion     (Blank rows = not tested)  LUMBAR SPECIAL TESTS:  Straight leg raise test: Negative and Slump test: Negative  FUNCTIONAL TESTS:  30 seconds chair stand test 22 arms   GAIT: Distance walked: 24ft x2 Assistive device utilized: None Level of assistance: Complete Independence Comments: slow cadence  TODAY'S TREATMENT:  [facility] Adult PT Treatment:                                                DATE: 08/12/23 Therapeutic  Exercise: Nustep L4 8 min Seated hamstring stretch 30s x2 Supine LTR 30s x2 STS 5# KB 10x Heel raises against wall 10x Supine hip fallouts GTB 10x B, 10/10 unilaterally Bridge against GTB 10x Bridge with ball 10x                                                                                                                          DATE: 07/27/23 Eval     PATIENT EDUCATION:  Education details: Discussed eval findings, rehab rationale and POC and patient is in agreement  Person educated: Patient Education method: Explanation Education comprehension: verbalized understanding  HOME EXERCISE PROGRAM: Access Code: [email or web address] URL: [email or web address] Date: 07/27/2023 Prepared by: Gustavus Bryant  Exercises - Sit to Stand with Arms Crossed  - 2 x daily - 5 x weekly - 1 sets - 10 reps  ASSESSMENT:  CLINICAL IMPRESSION: First f/u session no questions or concerns presented.  Todays session focused on LE stretching lumbar extension ROM, hip strength and stabilization tasks.  Able to perform all tasks w/o aggravation of symptoms.   Patient is a 57 y.o. male who was seen today for physical therapy evaluation and treatment for chronic low back pain. Patient presents with good LE strength and mobility, negative neuro tension signs and a good 30s chair stand test score.  Patient main deficits are core weakness and lack of endurance and activity tolerance.  Recommend 1w6 OPPT to establish HEP due to limited need of skilled PT.  OBJECTIVE IMPAIRMENTS: Abnormal gait, decreased activity tolerance, decreased endurance, decreased knowledge of condition, decreased mobility, difficulty walking, decreased strength, increased fascial restrictions, improper body mechanics, obesity, and pain.   ACTIVITY LIMITATIONS: carrying, lifting, bending, sitting, standing, and squatting  PERSONAL FACTORS: Age, Education, Fitness, and 3+ comorbidities: B THA, spinal fusion, DM  are also affecting  patient's functional outcome.   REHAB POTENTIAL: Good  CLINICAL DECISION MAKING: Stable/uncomplicated  EVALUATION COMPLEXITY: Low   GOALS: Goals reviewed with patient? No  SHORT TERM GOALS=LONG TERM GOALS: Target date: 09/21/2023    Patient to demonstrate independence in HEP  Baseline: J6VGTNPY Goal status: INITIAL  2.  Increase trunk mobility to 66% Baseline:  AROM eval  Flexion 50%  Extension 50%  Right lateral flexion 50%  Left lateral flexion 50%   Goal status: INITIAL  3.  Patient will score at least 40% on FOTO to signify clinically meaningful improvement in functional abilities.   Baseline: 30% Goal status: INITIAL  4.  Patient to acknowledge <10/10 pain at least once during episode of care Baseline: 10/10 Goal status: INITIAL    PLAN:  PT FREQUENCY: 1-2x/week  PT DURATION: 6 weeks  PLANNED  INTERVENTIONS: 97164- PT Re-evaluation, 97110-Therapeutic exercises, 97530- Therapeutic activity, [id number]- Neuromuscular re-education, 97535- Self Care, 16109- Manual therapy, [id number]- Gait training, 97014- Electrical stimulation (unattended), Spinal mobilization, Cryotherapy, and Moist heat.  PLAN FOR NEXT SESSION: HEP review and update, manual techniques as appropriate, aerobic tasks, ROM and flexibility activities, strengthening and PREs, TPDN, gait and balance training as needed    For all possible CPT codes, reference the Planned Interventions line above.     Check all conditions that are expected to impact treatment: {Conditions expected to impact treatment:Morbid obesity, Diabetes mellitus, and Uncorrected hearing or vision impairment   If treatment provided at initial evaluation, no treatment charged due to lack of authorization.      Hildred Laser, PT 08/12/2023, 11:27 AM

## 2023-08-18 NOTE — Therapy (Deleted)
OUTPATIENT PHYSICAL THERAPY TREATMENT NOTE   Patient Name: [name] MRN: [id number] DOB:[birth year], 57 y.o., male Today's Date: 08/18/2023  END OF SESSION:    Past Medical History:  Diagnosis Date   Acid reflux disease    Arthritis    Asthma    Gout    Headache    High blood pressure    High cholesterol    Psoriasis    Past Surgical History:  Procedure Laterality Date   JOINT REPLACEMENT     two total hip replacements   MULTIPLE EXTRACTIONS WITH ALVEOLOPLASTY N/A 06/19/2017   Procedure: MULTIPLE EXTRACTION WITH ALVEOLOPLASTY;  Surgeon: Ocie Doyne, DDS;  Location: [facility] OR;  Service: Oral Surgery;  Laterality: N/A;   SPINAL FUSION     TOTAL HIP ARTHROPLASTY     Patient Active Problem List   Diagnosis Date Noted   Bilateral lower leg cellulitis 03/16/2018   HLD (hyperlipidemia) 03/16/2018   Obesity, Class III, BMI 40-49.9 (morbid obesity) (HCC) 03/16/2018   Psoriasis 03/16/2018   GERD (gastroesophageal reflux disease) 03/16/2018   Asthma 03/16/2018   Hypothyroidism 03/16/2018   Aseptic necrosis of bone (HCC) 09/16/2007   Benign essential HTN 08/17/2007    PCP: Pcp, No   REFERRING PROVIDER: Jamey Reas, PA-C  REFERRING DIAG: M96.1 (ICD-10-CM) - Postlaminectomy syndrome, not elsewhere classified  Rationale for Evaluation and Treatment: Rehabilitation  THERAPY DIAG:  No diagnosis found.  ONSET DATE: chronic  SUBJECTIVE:                                                                                                                                                                                           SUBJECTIVE STATEMENT: patient returns for first f/u session.  Low back pain rated at 3/10 in intensity  PERTINENT HISTORY:  Referred to OPPT for chronic low back pain ongoing since lumbar fusion 2014.  B hip replacements 2012.  PAIN:  Are you having pain? Yes: NPRS scale: 10/10 Pain location: low back and BLE's Pain description: ache   Aggravating factors: activity, walking Relieving factors: medication ,lying supine  PRECAUTIONS: Back fusion L5-S1  RED FLAGS: None   WEIGHT BEARING RESTRICTIONS: No  FALLS:  Has patient fallen in last 6 months? No  OCCUPATION: disabled  PLOF: Independent  PATIENT GOALS: To manage my back pain and symptoms  NEXT MD VISIT: 07/2023  OBJECTIVE:  Note: Objective measures were completed at Evaluation unless otherwise noted.  DIAGNOSTIC FINDINGS:  Narrative & Impression  CLINICAL DATA:  Low back pain, weakness for 1 month   EXAM: MRI LUMBAR SPINE WITHOUT CONTRAST   TECHNIQUE: Multiplanar, multisequence MR imaging of the lumbar  spine was performed. No intravenous contrast was administered.   COMPARISON:  04/14/2013   FINDINGS: Segmentation:  Standard.   Alignment:  Grade 1 anterolisthesis of L5 on S1.   Vertebrae:  No fracture, evidence of discitis, or bone lesion.   Conus medullaris and cauda equina: Conus extends to the T12 level. Conus and cauda equina appear normal.   Paraspinal and other soft tissues: No acute paraspinal abnormality.   Disc levels:   Disc spaces: Posterior lumbar fusion at L5-S1.   T12-L1: No significant disc bulge. No evidence of neural foraminal stenosis. No central canal stenosis.   L1-L2: Minimal broad-based disc bulge. No evidence of neural foraminal stenosis. No central canal stenosis.   L2-L3: No significant disc bulge. No evidence of neural foraminal stenosis. No central canal stenosis.   L3-L4: Minimal broad-based disc bulge. No evidence of neural foraminal stenosis. No central canal stenosis.   L4-L5: No significant disc bulge. No evidence of neural foraminal stenosis. No central canal stenosis.   L5-S1: Interbody fusion. Severe bilateral foraminal stenosis. No central canal stenosis.   IMPRESSION: 1. Posterior lumbar interbody fusion at L5-S1 with severe bilateral foraminal stenosis.     Electronically Signed    By: Elige Ko   On: 09/15/2018 10:23    PATIENT SURVEYS:  FOTO 30(40 predicted)  MUSCLE LENGTH: Hamstrings: Right 80 deg; Left 70 deg  POSTURE: increased lumbar lordosis  PALPATION: Not tested  LUMBAR ROM:   AROM eval  Flexion 50%  Extension 50%  Right lateral flexion 50%  Left lateral flexion 50%  Right rotation   Left rotation    (Blank rows = not tested)  LOWER EXTREMITY ROM:   WFL for B THA's  Active  Right eval Left eval  Hip flexion    Hip extension    Hip abduction    Hip adduction    Hip internal rotation    Hip external rotation    Knee flexion    Knee extension    Ankle dorsiflexion    Ankle plantarflexion    Ankle inversion    Ankle eversion     (Blank rows = not tested)  LOWER EXTREMITY MMT:    MMT Right eval Left eval  Hip flexion 4+ 4+  Hip extension 4+ 4+  Hip abduction 4+ 4+  Hip adduction    Hip internal rotation    Hip external rotation    Knee flexion 4+ 4+  Knee extension 4+ 4+  Ankle dorsiflexion    Ankle plantarflexion 4+ 4+  Ankle inversion    Ankle eversion     (Blank rows = not tested)  LUMBAR SPECIAL TESTS:  Straight leg raise test: Negative and Slump test: Negative  FUNCTIONAL TESTS:  30 seconds chair stand test 22 arms   GAIT: Distance walked: 2ft x2 Assistive device utilized: None Level of assistance: Complete Independence Comments: slow cadence  TODAY'S TREATMENT:      [facility] Adult PT Treatment:                                                DATE: 08/12/23 Therapeutic Exercise: Nustep L4 8 min Seated hamstring stretch 30s x2 Supine LTR 30s x2 STS 5# KB 10x Heel raises against wall 10x Supine hip fallouts GTB 10x B, 10/10 unilaterally Bridge against GTB 10x Bridge with ball 10x  DATE: 07/27/23 Eval     PATIENT EDUCATION:  Education details: Discussed eval findings, rehab  rationale and POC and patient is in agreement  Person educated: Patient Education method: Explanation Education comprehension: verbalized understanding  HOME EXERCISE PROGRAM: Access Code: [email or web address] URL: [email or web address] Date: 07/27/2023 Prepared by: Gustavus Bryant  Exercises - Sit to Stand with Arms Crossed  - 2 x daily - 5 x weekly - 1 sets - 10 reps  ASSESSMENT:  CLINICAL IMPRESSION: First f/u session no questions or concerns presented.  Todays session focused on LE stretching lumbar extension ROM, hip strength and stabilization tasks.  Able to perform all tasks w/o aggravation of symptoms.   Patient is a 57 y.o. male who was seen today for physical therapy evaluation and treatment for chronic low back pain. Patient presents with good LE strength and mobility, negative neuro tension signs and a good 30s chair stand test score.  Patient main deficits are core weakness and lack of endurance and activity tolerance.  Recommend 1w6 OPPT to establish HEP due to limited need of skilled PT.  OBJECTIVE IMPAIRMENTS: Abnormal gait, decreased activity tolerance, decreased endurance, decreased knowledge of condition, decreased mobility, difficulty walking, decreased strength, increased fascial restrictions, improper body mechanics, obesity, and pain.   ACTIVITY LIMITATIONS: carrying, lifting, bending, sitting, standing, and squatting  PERSONAL FACTORS: Age, Education, Fitness, and 3+ comorbidities: B THA, spinal fusion, DM  are also affecting patient's functional outcome.   REHAB POTENTIAL: Good  CLINICAL DECISION MAKING: Stable/uncomplicated  EVALUATION COMPLEXITY: Low   GOALS: Goals reviewed with patient? No  SHORT TERM GOALS=LONG TERM GOALS: Target date: 09/21/2023    Patient to demonstrate independence in HEP  Baseline: [id number] Goal status: INITIAL  2.  Increase trunk mobility to 66% Baseline:  AROM eval  Flexion 50%  Extension 50%  Right lateral flexion  50%  Left lateral flexion 50%   Goal status: INITIAL  3.  Patient will score at least 40% on FOTO to signify clinically meaningful improvement in functional abilities.   Baseline: 30% Goal status: INITIAL  4.  Patient to acknowledge <10/10 pain at least once during episode of care Baseline: 10/10 Goal status: INITIAL    PLAN:  PT FREQUENCY: 1-2x/week  PT DURATION: 6 weeks  PLANNED INTERVENTIONS: 97164- PT Re-evaluation, 97110-Therapeutic exercises, 97530- Therapeutic activity, 97112- Neuromuscular re-education, 97535- Self Care, 78295- Manual therapy, [id number]- Gait training, 97014- Electrical stimulation (unattended), Spinal mobilization, Cryotherapy, and Moist heat.  PLAN FOR NEXT SESSION: HEP review and update, manual techniques as appropriate, aerobic tasks, ROM and flexibility activities, strengthening and PREs, TPDN, gait and balance training as needed    For all possible CPT codes, reference the Planned Interventions line above.     Check all conditions that are expected to impact treatment: {Conditions expected to impact treatment:Morbid obesity, Diabetes mellitus, and Uncorrected hearing or vision impairment   If treatment provided at initial evaluation, no treatment charged due to lack of authorization.      Hildred Laser, PT 08/18/2023, 2:40 PM

## 2023-08-20 ENCOUNTER — Ambulatory Visit: Payer: Medicaid Other

## 2023-08-26 ENCOUNTER — Ambulatory Visit: Payer: Medicaid Other

## 2023-09-02 ENCOUNTER — Ambulatory Visit: Payer: Medicaid Other

## 2023-09-02 NOTE — Therapy (Incomplete)
OUTPATIENT PHYSICAL THERAPY TREATMENT NOTE   Patient Name: [name] MRN: [id number] DOB:1966/07/10, 57 y.o., male Today's Date: 09/02/2023  END OF SESSION:    Past Medical History:  Diagnosis Date   Acid reflux disease    Arthritis    Asthma    Gout    Headache    High blood pressure    High cholesterol    Psoriasis    Past Surgical History:  Procedure Laterality Date   JOINT REPLACEMENT     two total hip replacements   MULTIPLE EXTRACTIONS WITH ALVEOLOPLASTY N/A 06/19/2017   Procedure: MULTIPLE EXTRACTION WITH ALVEOLOPLASTY;  Surgeon: Ocie Doyne, DDS;  Location: [facility] OR;  Service: Oral Surgery;  Laterality: N/A;   SPINAL FUSION     TOTAL HIP ARTHROPLASTY     Patient Active Problem List   Diagnosis Date Noted   Bilateral lower leg cellulitis 03/16/2018   HLD (hyperlipidemia) 03/16/2018   Obesity, Class III, BMI 40-49.9 (morbid obesity) (HCC) 03/16/2018   Psoriasis 03/16/2018   GERD (gastroesophageal reflux disease) 03/16/2018   Asthma 03/16/2018   Hypothyroidism 03/16/2018   Aseptic necrosis of bone (HCC) 09/16/2007   Benign essential HTN 08/17/2007    PCP: Pcp, No   REFERRING PROVIDER: Jamey Reas, PA-C  REFERRING DIAG: M96.1 (ICD-10-CM) - Postlaminectomy syndrome, not elsewhere classified  Rationale for Evaluation and Treatment: Rehabilitation  THERAPY DIAG:  No diagnosis found.  ONSET DATE: chronic  SUBJECTIVE:                                                                                                                                                                                           SUBJECTIVE STATEMENT: *** patient returns for first f/u session.  Low back pain rated at 3/10 in intensity  PERTINENT HISTORY:  Referred to OPPT for chronic low back pain ongoing since lumbar fusion 2014.  B hip replacements 2012.  PAIN:  Are you having pain? Yes: NPRS scale: *** 10/10 Pain location: low back and BLE's Pain description: ache   Aggravating factors: activity, walking Relieving factors: medication,lying supine  PRECAUTIONS: Back fusion L5-S1  RED FLAGS: None   WEIGHT BEARING RESTRICTIONS: No  FALLS:  Has patient fallen in last 6 months? No  OCCUPATION: disabled  PLOF: Independent  PATIENT GOALS: To manage my back pain and symptoms  NEXT MD VISIT: 07/2023  OBJECTIVE:  Note: Objective measures were completed at Evaluation unless otherwise noted.  DIAGNOSTIC FINDINGS:  Narrative & Impression  CLINICAL DATA:  Low back pain, weakness for 1 month   EXAM: MRI LUMBAR SPINE WITHOUT CONTRAST   TECHNIQUE: Multiplanar, multisequence MR imaging of the  lumbar spine was performed. No intravenous contrast was administered.   COMPARISON:  04/14/2013   FINDINGS: Segmentation:  Standard.   Alignment:  Grade 1 anterolisthesis of L5 on S1.   Vertebrae:  No fracture, evidence of discitis, or bone lesion.   Conus medullaris and cauda equina: Conus extends to the T12 level. Conus and cauda equina appear normal.   Paraspinal and other soft tissues: No acute paraspinal abnormality.   Disc levels:   Disc spaces: Posterior lumbar fusion at L5-S1.   T12-L1: No significant disc bulge. No evidence of neural foraminal stenosis. No central canal stenosis.   L1-L2: Minimal broad-based disc bulge. No evidence of neural foraminal stenosis. No central canal stenosis.   L2-L3: No significant disc bulge. No evidence of neural foraminal stenosis. No central canal stenosis.   L3-L4: Minimal broad-based disc bulge. No evidence of neural foraminal stenosis. No central canal stenosis.   L4-L5: No significant disc bulge. No evidence of neural foraminal stenosis. No central canal stenosis.   L5-S1: Interbody fusion. Severe bilateral foraminal stenosis. No central canal stenosis.   IMPRESSION: 1. Posterior lumbar interbody fusion at L5-S1 with severe bilateral foraminal stenosis.     Electronically Signed    By: Elige Ko   On: 09/15/2018 10:23    PATIENT SURVEYS:  FOTO 30(40 predicted)  MUSCLE LENGTH: Hamstrings: Right 80 deg; Left 70 deg  POSTURE: increased lumbar lordosis  PALPATION: Not tested  LUMBAR ROM:   AROM eval  Flexion 50%  Extension 50%  Right lateral flexion 50%  Left lateral flexion 50%  Right rotation   Left rotation    (Blank rows = not tested)  LOWER EXTREMITY ROM:   WFL for B THA's  Active  Right eval Left eval  Hip flexion    Hip extension    Hip abduction    Hip adduction    Hip internal rotation    Hip external rotation    Knee flexion    Knee extension    Ankle dorsiflexion    Ankle plantarflexion    Ankle inversion    Ankle eversion     (Blank rows = not tested)  LOWER EXTREMITY MMT:    MMT Right eval Left eval  Hip flexion 4+ 4+  Hip extension 4+ 4+  Hip abduction 4+ 4+  Hip adduction    Hip internal rotation    Hip external rotation    Knee flexion 4+ 4+  Knee extension 4+ 4+  Ankle dorsiflexion    Ankle plantarflexion 4+ 4+  Ankle inversion    Ankle eversion     (Blank rows = not tested)  LUMBAR SPECIAL TESTS:  Straight leg raise test: Negative and Slump test: Negative  FUNCTIONAL TESTS:  30 seconds chair stand test 22 arms   GAIT: Distance walked: 59ft x2 Assistive device utilized: None Level of assistance: Complete Independence Comments: slow cadence  TODAY'S TREATMENT:   [facility] Adult PT Treatment:                                                DATE: 09/02/23 Therapeutic Exercise: Nustep L4 8 min Seated hamstring stretch 30s x2 Supine LTR 30s x2 STS 5# KB 10x Heel raises against wall 10x Supine hip fallouts GTB 10x B, 10/10 unilaterally Bridge against GTB 10x Bridge with ball 10x      [facility] Adult PT  Treatment:                                                DATE: 08/12/23 Therapeutic Exercise: Nustep L4 8 min Seated hamstring stretch 30s x2 Supine LTR 30s x2 STS 5# KB 10x Heel raises against wall  10x Supine hip fallouts GTB 10x B, 10/10 unilaterally Bridge against GTB 10x Bridge with ball 10x                                                                                                                          DATE: 07/27/23 Eval     PATIENT EDUCATION:  Education details: Discussed eval findings, rehab rationale and POC and patient is in agreement  Person educated: Patient Education method: Explanation Education comprehension: verbalized understanding  HOME EXERCISE PROGRAM: Access Code: [email or web address] URL: [email or web address] Date: 07/27/2023 Prepared by: Gustavus Bryant  Exercises - Sit to Stand with Arms Crossed  - 2 x daily - 5 x weekly - 1 sets - 10 reps  ASSESSMENT:  CLINICAL IMPRESSION:  *** First f/u session no questions or concerns presented.  Todays session focused on LE stretching lumbar extension ROM, hip strength and stabilization tasks.  Able to perform all tasks w/o aggravation of symptoms.   Patient is a 57 y.o. male who was seen today for physical therapy evaluation and treatment for chronic low back pain. Patient presents with good LE strength and mobility, negative neuro tension signs and a good 30s chair stand test score.  Patient main deficits are core weakness and lack of endurance and activity tolerance.  Recommend 1w6 OPPT to establish HEP due to limited need of skilled PT.  OBJECTIVE IMPAIRMENTS: Abnormal gait, decreased activity tolerance, decreased endurance, decreased knowledge of condition, decreased mobility, difficulty walking, decreased strength, increased fascial restrictions, improper body mechanics, obesity, and pain.   ACTIVITY LIMITATIONS: carrying, lifting, bending, sitting, standing, and squatting  PERSONAL FACTORS: Age, Education, Fitness, and 3+ comorbidities: B THA, spinal fusion, DM  are also affecting patient's functional outcome.   REHAB POTENTIAL: Good  CLINICAL DECISION MAKING: Stable/uncomplicated  EVALUATION  COMPLEXITY: Low   GOALS: Goals reviewed with patient? No  SHORT TERM GOALS=LONG TERM GOALS: Target date: 09/21/2023    Patient to demonstrate independence in HEP  Baseline: [id number] Goal status: INITIAL  2.  Increase trunk mobility to 66% Baseline:  AROM eval  Flexion 50%  Extension 50%  Right lateral flexion 50%  Left lateral flexion 50%   Goal status: INITIAL  3.  Patient will score at least 40% on FOTO to signify clinically meaningful improvement in functional abilities.   Baseline: 30% Goal status: INITIAL  4.  Patient to acknowledge <10/10 pain at least once during episode of care Baseline: 10/10 Goal status: INITIAL    PLAN:  PT FREQUENCY: 1-2x/week  PT DURATION: 6 weeks  PLANNED INTERVENTIONS:  [id number]- PT Re-evaluation, 97110-Therapeutic exercises, 97530- Therapeutic activity, [id number]- Neuromuscular re-education, 97535- Self Care, [id number]- Manual therapy, [id number]- Gait training, 97014- Electrical stimulation (unattended), Spinal mobilization, Cryotherapy, and Moist heat.  PLAN FOR NEXT SESSION: HEP review and update, manual techniques as appropriate, aerobic tasks, ROM and flexibility activities, strengthening and PREs, TPDN, gait and balance training as needed    For all possible CPT codes, reference the Planned Interventions line above.     Check all conditions that are expected to impact treatment: {Conditions expected to impact treatment:Morbid obesity, Diabetes mellitus, and Uncorrected hearing or vision impairment   If treatment provided at initial evaluation, no treatment charged due to lack of authorization.      Berta Minor, PTA 09/02/2023, 8:02 AM

## 2023-09-08 NOTE — Therapy (Unsigned)
OUTPATIENT PHYSICAL THERAPY TREATMENT NOTE   Patient Name: [name] MRN: [id number] DOB:1966-09-18, 57 y.o., male Today's Date: 09/08/2023  END OF SESSION:    Past Medical History:  Diagnosis Date   Acid reflux disease    Arthritis    Asthma    Gout    Headache    High blood pressure    High cholesterol    Psoriasis    Past Surgical History:  Procedure Laterality Date   JOINT REPLACEMENT     two total hip replacements   MULTIPLE EXTRACTIONS WITH ALVEOLOPLASTY N/A 06/19/2017   Procedure: MULTIPLE EXTRACTION WITH ALVEOLOPLASTY;  Surgeon: Ocie Doyne, DDS;  Location: [facility] OR;  Service: Oral Surgery;  Laterality: N/A;   SPINAL FUSION     TOTAL HIP ARTHROPLASTY     Patient Active Problem List   Diagnosis Date Noted   Bilateral lower leg cellulitis 03/16/2018   HLD (hyperlipidemia) 03/16/2018   Obesity, Class III, BMI 40-49.9 (morbid obesity) (HCC) 03/16/2018   Psoriasis 03/16/2018   GERD (gastroesophageal reflux disease) 03/16/2018   Asthma 03/16/2018   Hypothyroidism 03/16/2018   Aseptic necrosis of bone (HCC) 09/16/2007   Benign essential HTN 08/17/2007    PCP: Pcp, No   REFERRING PROVIDER: Jamey Reas, PA-C  REFERRING DIAG: M96.1 (ICD-10-CM) - Postlaminectomy syndrome, not elsewhere classified  Rationale for Evaluation and Treatment: Rehabilitation  THERAPY DIAG:  No diagnosis found.  ONSET DATE: chronic  SUBJECTIVE:                                                                                                                                                                                           SUBJECTIVE STATEMENT: *** patient returns for first f/u session.  Low back pain rated at 3/10 in intensity  PERTINENT HISTORY:  Referred to OPPT for chronic low back pain ongoing since lumbar fusion 2014.  B hip replacements 2012.  PAIN:  Are you having pain? Yes: NPRS scale: *** 10/10 Pain location: low back and BLE's Pain description:  ache  Aggravating factors: activity, walking Relieving factors: medication,lying supine  PRECAUTIONS: Back fusion L5-S1  RED FLAGS: None   WEIGHT BEARING RESTRICTIONS: No  FALLS:  Has patient fallen in last 6 months? No  OCCUPATION: disabled  PLOF: Independent  PATIENT GOALS: To manage my back pain and symptoms  NEXT MD VISIT: 07/2023  OBJECTIVE:  Note: Objective measures were completed at Evaluation unless otherwise noted.  DIAGNOSTIC FINDINGS:  Narrative & Impression  CLINICAL DATA:  Low back pain, weakness for 1 month   EXAM: MRI LUMBAR SPINE WITHOUT CONTRAST   TECHNIQUE: Multiplanar, multisequence MR imaging of the  lumbar spine was performed. No intravenous contrast was administered.   COMPARISON:  04/14/2013   FINDINGS: Segmentation:  Standard.   Alignment:  Grade 1 anterolisthesis of L5 on S1.   Vertebrae:  No fracture, evidence of discitis, or bone lesion.   Conus medullaris and cauda equina: Conus extends to the T12 level. Conus and cauda equina appear normal.   Paraspinal and other soft tissues: No acute paraspinal abnormality.   Disc levels:   Disc spaces: Posterior lumbar fusion at L5-S1.   T12-L1: No significant disc bulge. No evidence of neural foraminal stenosis. No central canal stenosis.   L1-L2: Minimal broad-based disc bulge. No evidence of neural foraminal stenosis. No central canal stenosis.   L2-L3: No significant disc bulge. No evidence of neural foraminal stenosis. No central canal stenosis.   L3-L4: Minimal broad-based disc bulge. No evidence of neural foraminal stenosis. No central canal stenosis.   L4-L5: No significant disc bulge. No evidence of neural foraminal stenosis. No central canal stenosis.   L5-S1: Interbody fusion. Severe bilateral foraminal stenosis. No central canal stenosis.   IMPRESSION: 1. Posterior lumbar interbody fusion at L5-S1 with severe bilateral foraminal stenosis.     Electronically  Signed   By: Elige Ko   On: 09/15/2018 10:23    PATIENT SURVEYS:  FOTO 30(40 predicted)  MUSCLE LENGTH: Hamstrings: Right 80 deg; Left 70 deg  POSTURE: increased lumbar lordosis  PALPATION: Not tested  LUMBAR ROM:   AROM eval  Flexion 50%  Extension 50%  Right lateral flexion 50%  Left lateral flexion 50%  Right rotation   Left rotation    (Blank rows = not tested)  LOWER EXTREMITY ROM:   WFL for B THA's  Active  Right eval Left eval  Hip flexion    Hip extension    Hip abduction    Hip adduction    Hip internal rotation    Hip external rotation    Knee flexion    Knee extension    Ankle dorsiflexion    Ankle plantarflexion    Ankle inversion    Ankle eversion     (Blank rows = not tested)  LOWER EXTREMITY MMT:    MMT Right eval Left eval  Hip flexion 4+ 4+  Hip extension 4+ 4+  Hip abduction 4+ 4+  Hip adduction    Hip internal rotation    Hip external rotation    Knee flexion 4+ 4+  Knee extension 4+ 4+  Ankle dorsiflexion    Ankle plantarflexion 4+ 4+  Ankle inversion    Ankle eversion     (Blank rows = not tested)  LUMBAR SPECIAL TESTS:  Straight leg raise test: Negative and Slump test: Negative  FUNCTIONAL TESTS:  30 seconds chair stand test 22 arms   GAIT: Distance walked: 38ft x2 Assistive device utilized: None Level of assistance: Complete Independence Comments: slow cadence  TODAY'S TREATMENT:   [facility] Adult PT Treatment:                                                DATE: 09/02/23 Therapeutic Exercise: Nustep L4 8 min Seated hamstring stretch 30s x2 Supine LTR 30s x2 STS 5# KB 10x Heel raises against wall 10x Supine hip fallouts GTB 10x B, 10/10 unilaterally Bridge against GTB 10x Bridge with ball 10x      [facility] Adult PT  Treatment:                                                DATE: 08/12/23 Therapeutic Exercise: Nustep L4 8 min Seated hamstring stretch 30s x2 Supine LTR 30s x2 STS 5# KB 10x Heel raises  against wall 10x Supine hip fallouts GTB 10x B, 10/10 unilaterally Bridge against GTB 10x Bridge with ball 10x                                                                                                                          DATE: 07/27/23 Eval     PATIENT EDUCATION:  Education details: Discussed eval findings, rehab rationale and POC and patient is in agreement  Person educated: Patient Education method: Explanation Education comprehension: verbalized understanding  HOME EXERCISE PROGRAM: Access Code: [email or web address] URL: [email or web address] Date: 07/27/2023 Prepared by: Gustavus Bryant  Exercises - Sit to Stand with Arms Crossed  - 2 x daily - 5 x weekly - 1 sets - 10 reps  ASSESSMENT:  CLINICAL IMPRESSION:  *** First f/u session no questions or concerns presented.  Todays session focused on LE stretching lumbar extension ROM, hip strength and stabilization tasks.  Able to perform all tasks w/o aggravation of symptoms.   Patient is a 57 y.o. male who was seen today for physical therapy evaluation and treatment for chronic low back pain. Patient presents with good LE strength and mobility, negative neuro tension signs and a good 30s chair stand test score.  Patient main deficits are core weakness and lack of endurance and activity tolerance.  Recommend 1w6 OPPT to establish HEP due to limited need of skilled PT.  OBJECTIVE IMPAIRMENTS: Abnormal gait, decreased activity tolerance, decreased endurance, decreased knowledge of condition, decreased mobility, difficulty walking, decreased strength, increased fascial restrictions, improper body mechanics, obesity, and pain.   ACTIVITY LIMITATIONS: carrying, lifting, bending, sitting, standing, and squatting  PERSONAL FACTORS: Age, Education, Fitness, and 3+ comorbidities: B THA, spinal fusion, DM  are also affecting patient's functional outcome.   REHAB POTENTIAL: Good  CLINICAL DECISION MAKING:  Stable/uncomplicated  EVALUATION COMPLEXITY: Low   GOALS: Goals reviewed with patient? No  SHORT TERM GOALS=LONG TERM GOALS: Target date: 09/21/2023    Patient to demonstrate independence in HEP  Baseline: [email or web address] Goal status: INITIAL  2.  Increase trunk mobility to 66% Baseline:  AROM eval  Flexion 50%  Extension 50%  Right lateral flexion 50%  Left lateral flexion 50%   Goal status: INITIAL  3.  Patient will score at least 40% on FOTO to signify clinically meaningful improvement in functional abilities.   Baseline: 30% Goal status: INITIAL  4.  Patient to acknowledge <10/10 pain at least once during episode of care Baseline: 10/10 Goal status: INITIAL    PLAN:  PT FREQUENCY: 1-2x/week  PT DURATION: 6 weeks  PLANNED INTERVENTIONS:  54098- PT Re-evaluation, 97110-Therapeutic exercises, 97530- Therapeutic activity, [id number]- Neuromuscular re-education, 97535- Self Care, 11914- Manual therapy, [id number]- Gait training, 97014- Electrical stimulation (unattended), Spinal mobilization, Cryotherapy, and Moist heat.  PLAN FOR NEXT SESSION: HEP review and update, manual techniques as appropriate, aerobic tasks, ROM and flexibility activities, strengthening and PREs, TPDN, gait and balance training as needed    For all possible CPT codes, reference the Planned Interventions line above.     Check all conditions that are expected to impact treatment: {Conditions expected to impact treatment:Morbid obesity, Diabetes mellitus, and Uncorrected hearing or vision impairment   If treatment provided at initial evaluation, no treatment charged due to lack of authorization.      Hildred Laser, PT 09/08/2023, 5:47 PM

## 2023-09-09 ENCOUNTER — Ambulatory Visit: Payer: Medicaid Other

## 2024-01-20 ENCOUNTER — Emergency Department (HOSPITAL_COMMUNITY)

## 2024-01-20 ENCOUNTER — Other Ambulatory Visit: Payer: Self-pay

## 2024-01-20 ENCOUNTER — Encounter (HOSPITAL_COMMUNITY): Payer: Self-pay

## 2024-01-20 ENCOUNTER — Emergency Department (HOSPITAL_COMMUNITY)
Admission: EM | Admit: 2024-01-20 | Discharge: 2024-01-21 | Disposition: A | Discharge status: Home / Self Care | Attending: Emergency Medicine | Admitting: Emergency Medicine

## 2024-01-20 DIAGNOSIS — Z79899 Other long term (current) drug therapy: Secondary | ICD-10-CM | POA: Insufficient documentation

## 2024-01-20 DIAGNOSIS — R1013 Epigastric pain: Secondary | ICD-10-CM | POA: Diagnosis present

## 2024-01-20 DIAGNOSIS — Z7982 Long term (current) use of aspirin: Secondary | ICD-10-CM | POA: Diagnosis not present

## 2024-01-20 DIAGNOSIS — I10 Essential (primary) hypertension: Secondary | ICD-10-CM | POA: Diagnosis not present

## 2024-01-20 DIAGNOSIS — K529 Noninfective gastroenteritis and colitis, unspecified: Secondary | ICD-10-CM | POA: Insufficient documentation

## 2024-01-20 LAB — URINALYSIS, ROUTINE W REFLEX MICROSCOPIC
Bilirubin Urine: NEGATIVE
Glucose, UA: NEGATIVE mg/dL
Hgb urine dipstick: NEGATIVE
Ketones, ur: NEGATIVE mg/dL
Leukocytes,Ua: NEGATIVE
Nitrite: NEGATIVE
Protein, ur: 30 mg/dL — AB
Specific Gravity, Urine: 1.021 (ref 1.005–1.030)
pH: 5 (ref 5.0–8.0)

## 2024-01-20 LAB — COMPREHENSIVE METABOLIC PANEL WITH GFR
ALT: 18 U/L (ref 0–44)
AST: 23 U/L (ref 15–41)
Albumin: 3.3 g/dL — ABNORMAL LOW (ref 3.5–5.0)
Alkaline Phosphatase: 109 U/L (ref 38–126)
Anion gap: 11 (ref 5–15)
BUN: 15 mg/dL (ref 6–20)
CO2: 21 mmol/L — ABNORMAL LOW (ref 22–32)
Calcium: 9.3 mg/dL (ref 8.9–10.3)
Chloride: 103 mmol/L (ref 98–111)
Creatinine, Ser: 0.69 mg/dL (ref 0.61–1.24)
GFR, Estimated: >60 mL/min
Glucose, Bld: 148 mg/dL — ABNORMAL HIGH (ref 70–99)
Potassium: 3.7 mmol/L (ref 3.5–5.1)
Sodium: 135 mmol/L (ref 135–145)
Total Bilirubin: 0.8 mg/dL (ref 0.0–1.2)
Total Protein: 7 g/dL (ref 6.5–8.1)

## 2024-01-20 LAB — CBC WITH DIFFERENTIAL/PLATELET
Abs Immature Granulocytes: 0.15 K/uL — ABNORMAL HIGH (ref 0.00–0.07)
Basophils Absolute: 0.1 K/uL (ref 0.0–0.1)
Basophils Relative: 0 %
Eosinophils Absolute: 0.3 K/uL (ref 0.0–0.5)
Eosinophils Relative: 2 %
HCT: 48.8 % (ref 39.0–52.0)
Hemoglobin: 16.4 g/dL (ref 13.0–17.0)
Immature Granulocytes: 1 %
Lymphocytes Relative: 20 %
Lymphs Abs: 2.9 K/uL (ref 0.7–4.0)
MCH: 30.8 pg (ref 26.0–34.0)
MCHC: 33.6 g/dL (ref 30.0–36.0)
MCV: 91.7 fL (ref 80.0–100.0)
Monocytes Absolute: 1.1 K/uL — ABNORMAL HIGH (ref 0.1–1.0)
Monocytes Relative: 7 %
Neutro Abs: 10.2 K/uL — ABNORMAL HIGH (ref 1.7–7.7)
Neutrophils Relative %: 70 %
Platelets: 367 K/uL (ref 150–400)
RBC: 5.32 MIL/uL (ref 4.22–5.81)
RDW: 12.5 % (ref 11.5–15.5)
WBC: 14.7 K/uL — ABNORMAL HIGH (ref 4.0–10.5)
nRBC: 0 % (ref 0.0–0.2)

## 2024-01-20 LAB — LIPASE, BLOOD: Lipase: 32 U/L (ref 11–51)

## 2024-01-20 MED ORDER — SODIUM CHLORIDE 0.9 % IV BOLUS
1000.0000 mL | Freq: Once | INTRAVENOUS | Status: AC
  Administered 2024-01-20: 1000 mL via INTRAVENOUS

## 2024-01-20 MED ORDER — DICYCLOMINE HCL 20 MG PO TABS: ORAL_TABLET | ORAL | 0 refills | Status: AC

## 2024-01-20 MED ORDER — IOHEXOL 300 MG/ML  SOLN
100.0000 mL | Freq: Once | INTRAMUSCULAR | Status: AC | PRN
  Administered 2024-01-20: 100 mL via INTRAVENOUS

## 2024-01-20 MED ORDER — HYDROMORPHONE HCL 1 MG/ML IJ SOLN
0.5000 mg | Freq: Once | INTRAMUSCULAR | Status: AC
  Administered 2024-01-20: 0.5 mg via INTRAVENOUS
  Filled 2024-01-20: qty 0.5

## 2024-01-20 MED ORDER — IOHEXOL 350 MG/ML SOLN
75.0000 mL | Freq: Once | INTRAVENOUS | Status: AC | PRN
  Administered 2024-01-20: 75 mL via INTRAVENOUS

## 2024-01-20 MED ORDER — PANTOPRAZOLE SODIUM 40 MG IV SOLR
40.0000 mg | Freq: Once | INTRAVENOUS | Status: AC
  Administered 2024-01-20: 40 mg via INTRAVENOUS
  Filled 2024-01-20 (×2): qty 10

## 2024-01-20 MED ORDER — PANTOPRAZOLE SODIUM 20 MG PO TBEC: 20.0000 mg | DELAYED_RELEASE_TABLET | Freq: Every day | ORAL | 0 refills | Status: AC

## 2024-01-20 NOTE — Discharge Instructions (Signed)
 Follow-up with your family doctor or the stomach specialist Dr. Riley Cheadle next week

## 2024-01-20 NOTE — ED Triage Notes (Signed)
 Pt arrived via POV c/o generalized lower abdominal pain and epigastric pain. Pt reports last BM was earlier today and reports it was black tarry stool.

## 2024-01-20 NOTE — ED Provider Notes (Signed)
 [place] EMERGENCY DEPARTMENT AT [facility] Provider Note   CSN: [id number] Arrival date & time: 01/20/24  1648     History {Add pertinent medical, surgical, social history, OB history to HPI:1} Chief Complaint  Patient presents with   Abdominal Pain    [name] is a 58 y.o. male.  Patient has a history of hypertension and psoriasis.  Patient complains of epigastric and periumbilical abdominal discomfort.  Some nausea no vomiting  The history is provided by the patient and medical records.  Abdominal Pain Pain location:  Epigastric Pain quality: not aching   Pain radiates to:  Does not radiate Pain severity:  Moderate Onset quality:  Sudden Timing:  Constant Progression:  Worsening Chronicity:  New Context: not alcohol use   Relieved by:  Nothing Associated symptoms: no chest pain, no cough, no diarrhea, no fatigue and no hematuria        Home Medications Prior to Admission medications   Medication Sig Start Date End Date Taking? Authorizing Provider  albuterol  (PROVENTIL  HFA;VENTOLIN  HFA) 108 (90 BASE) MCG/ACT inhaler Inhale 2 puffs into the lungs 4 (four) times daily as needed for wheezing or shortness of breath.     [provider]  albuterol  (PROVENTIL ) (2.5 MG/3ML) 0.083% nebulizer solution Inhale 3 mLs into the lungs 4 (four) times daily as needed for wheezing or shortness of breath. 03/19/18   Jefferson Mines, MD  amLODipine  (NORVASC ) 5 MG tablet Take 1 tablet (5 mg total) by mouth daily. 03/20/18   Jefferson Mines, MD  aspirin  EC 325 MG tablet Take 1 tablet (325 mg total) by mouth daily. 09/25/18   Dorenda Gandy, MD  atorvastatin  (LIPITOR) 40 MG tablet Take 40 mg by mouth daily.    [provider]  cholecalciferol  (VITAMIN D ) 1000 units tablet Take 5,000 Units by mouth daily.    [provider]  colchicine  0.6 MG tablet Take two tablets for first dose, then one tablet one hour later for a max dose of 1.8mg  on  the first day.  Then take one tablet twice daily for 3 days. 02/23/20   Triplett, Tammy, PA-C  hydrOXYzine  (ATARAX /VISTARIL ) 25 MG tablet Take 1 tablet (25 mg total) by mouth every 6 (six) hours as needed for itching. 03/15/15   Triplett, Tammy, PA-C  levothyroxine  (SYNTHROID , LEVOTHROID) 50 MCG tablet Take 50 mcg by mouth daily before breakfast.    [provider]  lisinopril  (PRINIVIL ,ZESTRIL ) 20 MG tablet Take 20 mg by mouth daily.    [provider]  naproxen  (NAPROSYN ) 500 MG tablet Take 500 mg by mouth daily.     [provider]  omeprazole (PRILOSEC) 20 MG capsule Take 20 mg by mouth daily as needed (for acid reflux).     [provider]  oxyCODONE  (OXY IR/ROXICODONE ) 5 MG immediate release tablet Take 5 mg by mouth every 6 (six) hours as needed for moderate pain.    [provider]  potassium chloride  SA (K-DUR,KLOR-CON ) 20 MEQ tablet Take 1 tablet (20 mEq total) by mouth daily. 03/20/18   Jefferson Mines, MD  Skin Protectants, Misc. (EUCERIN) cream Apply 1 application topically as needed for dry skin.    [provider]      Allergies    Patient has no known allergies.    Review of Systems   Review of Systems  Constitutional:  Negative for appetite change and fatigue.  HENT:  Negative for congestion, ear discharge and sinus pressure.   Eyes:  Negative for  discharge.  Respiratory:  Negative for cough.   Cardiovascular:  Negative for chest pain.  Gastrointestinal:  Positive for abdominal pain. Negative for diarrhea.  Genitourinary:  Negative for frequency and hematuria.  Musculoskeletal:  Negative for back pain.  Skin:  Negative for rash.  Neurological:  Negative for seizures and headaches.  Psychiatric/Behavioral:  Negative for hallucinations.     Physical Exam Updated Vital Signs BP (!) 138/94 (BP Location: Right Arm)   Pulse 83   Temp 98.4 F (36.9 C) (Oral)   Resp 17   Ht 5\' 7"  (1.702 m)   Wt 113 kg   SpO2 96%    BMI 39.02 kg/m  Physical Exam Vitals and nursing note reviewed.  Constitutional:      Appearance: He is well-developed.  HENT:     Head: Normocephalic.  Eyes:     General: No scleral icterus.    Conjunctiva/sclera: Conjunctivae normal.  Neck:     Thyroid: No thyromegaly.  Cardiovascular:     Rate and Rhythm: Normal rate and regular rhythm.     Heart sounds: No murmur heard.    No friction rub. No gallop.  Pulmonary:     Breath sounds: No stridor. No wheezing or rales.  Chest:     Chest wall: No tenderness.  Abdominal:     General: There is no distension.     Tenderness: There is abdominal tenderness. There is no rebound.  Musculoskeletal:        General: Normal range of motion.     Cervical back: Neck supple.  Lymphadenopathy:     Cervical: No cervical adenopathy.  Skin:    Findings: No erythema or rash.  Neurological:     Mental Status: He is alert and oriented to person, place, and time.     Motor: No abnormal muscle tone.     Coordination: Coordination normal.  Psychiatric:        Behavior: Behavior normal.     ED Results / Procedures / Treatments   Labs (all labs ordered are listed, but only abnormal results are displayed) Labs Reviewed  COMPREHENSIVE METABOLIC PANEL WITH GFR - Abnormal; Notable for the following components:      Result Value   CO2 21 (*)    Glucose, Bld 148 (*)    Albumin 3.3 (*)    All other components within normal limits  URINALYSIS, ROUTINE W REFLEX MICROSCOPIC - Abnormal; Notable for the following components:   Protein, ur 30 (*)    Bacteria, UA RARE (*)    All other components within normal limits  CBC WITH DIFFERENTIAL/PLATELET - Abnormal; Notable for the following components:   WBC 14.7 (*)    Neutro Abs 10.2 (*)    Monocytes Absolute 1.1 (*)    Abs Immature Granulocytes 0.15 (*)    All other components within normal limits  LIPASE, BLOOD    EKG None  Radiology CT ABDOMEN PELVIS W CONTRAST Result Date:  01/20/2024 CLINICAL DATA:  Acute abdominal pain EXAM: CT ABDOMEN AND PELVIS WITH CONTRAST TECHNIQUE: Multidetector CT imaging of the abdomen and pelvis was performed using the standard protocol following bolus administration of intravenous contrast. RADIATION DOSE REDUCTION: This exam was performed according to the departmental dose-optimization program which includes automated exposure control, adjustment of the mA and/or kV according to patient size and/or use of iterative reconstruction technique. CONTRAST:  OMNIPAQUE  IOHEXOL  300 MG/ML  SOLN COMPARISON:  None Available. FINDINGS: Lower chest: No acute abnormality. Hepatobiliary: There is diffuse fatty infiltration  of the liver. The liver is mildly enlarged gallbladder and bile ducts are within normal limits. Pancreas: Unremarkable. No pancreatic ductal dilatation or surrounding inflammatory changes. Spleen: Normal in size without focal abnormality. Adrenals/Urinary Tract: The bladder and distal ureters are not well seen secondary to streak artifact in the pelvis. There is a rounded hypodensity in the left kidney which is too small to characterize, likely a cyst. Otherwise, the adrenal glands and kidneys are within normal limits. Stomach/Bowel: Is moderate length segment of distal ileum/terminal ileum demonstrates diffuse wall thickening with associated mesenteric edema. The appendix measures 8 mm without surrounding inflammation. There is no pneumatosis or free air. There is no bowel obstruction. Stomach and colon are within normal limits. Vascular/Lymphatic: Aortic atherosclerosis. No enlarged abdominal or pelvic lymph nodes. Reproductive: Prostate gland grossly within normal limits, but not well evaluated secondary to streak artifact in the pelvis. Other: There is a small amount of free fluid in the right upper quadrant and pelvis. Musculoskeletal: L5-S1 posterior fusion hardware present. Bilateral hip arthroplasties are present. IMPRESSION: 1. Moderate  length segment of distal ileum/terminal ileum demonstrates diffuse wall thickening with associated mesenteric edema. Findings are compatible with nonspecific enteritis including infectious, inflammatory and ischemic etiologies. 2. Small amount of free fluid in the right upper quadrant and pelvis. 3. Mild hepatomegaly with fatty infiltration of the liver. 4. Aortic atherosclerosis. Aortic Atherosclerosis (ICD10-I70.0). Electronically Signed   By: Tyron Gallon M.D.   On: 01/20/2024 22:35    Procedures Procedures  {Document cardiac monitor, telemetry assessment procedure when appropriate:1}  Medications Ordered in ED Medications  sodium chloride  0.9 % bolus 1,000 mL (1,000 mLs Intravenous New Bag/Given 01/20/24 2107)  pantoprazole  (PROTONIX ) injection 40 mg (40 mg Intravenous Given 01/20/24 2117)  iohexol  (OMNIPAQUE ) 300 MG/ML solution 100 mL (100 mLs Intravenous Contrast Given 01/20/24 2138)  HYDROmorphone  (DILAUDID ) injection 0.5 mg (0.5 mg Intravenous Given 01/20/24 2325)  iohexol  (OMNIPAQUE ) 350 MG/ML injection 75 mL (75 mLs Intravenous Contrast Given 01/20/24 2329)    ED Course/ Medical Decision Making/ A&P   {   Click here for ABCD2, HEART and other calculatorsREFRESH Note before signing :1}                              Medical Decision Making Amount and/or Complexity of Data Reviewed Labs: ordered. Radiology: ordered.  Risk Prescription drug management.   ***  {Document critical care time when appropriate:1} {Document review of labs and clinical decision tools ie heart score, Chads2Vasc2 etc:1}  {Document your independent review of radiology images, and any outside records:1} {Document your discussion with family members, caretakers, and with consultants:1} {Document social determinants of health affecting pt's care:1} {Document your decision making why or why not admission, treatments were needed:1} Final Clinical Impression(s) / ED Diagnoses Final diagnoses:  None    Rx /  DC Orders ED Discharge Orders     None

## 2024-01-20 NOTE — ED Provider Notes (Signed)
 11:51 PM Assumed care from Dr. Zammit, please see their note for full history, physical and decision making until this point. In brief this is a 58 y.o. year old male who presented to the ED tonight with Abdominal Pain     R/O mesenteric ischemia, pending CTA.   Discharge instructions, including strict return precautions for new or worsening symptoms, given. Patient and/or family verbalized understanding and agreement with the plan as described.   Labs, studies and imaging reviewed by myself and considered in medical decision making if ordered. Imaging interpreted by radiology.  Labs Reviewed  COMPREHENSIVE METABOLIC PANEL WITH GFR - Abnormal; Notable for the following components:      Result Value   CO2 21 (*)    Glucose, Bld 148 (*)    Albumin 3.3 (*)    All other components within normal limits  URINALYSIS, ROUTINE W REFLEX MICROSCOPIC - Abnormal; Notable for the following components:   Protein, ur 30 (*)    Bacteria, UA RARE (*)    All other components within normal limits  CBC WITH DIFFERENTIAL/PLATELET - Abnormal; Notable for the following components:   WBC 14.7 (*)    Neutro Abs 10.2 (*)    Monocytes Absolute 1.1 (*)    Abs Immature Granulocytes 0.15 (*)    All other components within normal limits  LIPASE, BLOOD    CT ABDOMEN PELVIS W CONTRAST  Final Result    CT Angio Abd/Pel W and/or Wo Contrast    (Results Pending)    No follow-ups on file.

## 2024-01-20 NOTE — ED Notes (Signed)
 Patient A&Ox4. Complaining of achy abdominal pain 10/10. Family in room.

## 2024-01-20 NOTE — ED Notes (Signed)
 Patient transported to CT

## 2024-01-21 LAB — LACTIC ACID, PLASMA: Lactic Acid, Venous: 1 mmol/L (ref 0.5–1.9)

## 2024-01-21 MED ORDER — DICYCLOMINE HCL 10 MG PO CAPS
10.0000 mg | ORAL_CAPSULE | Freq: Once | ORAL | Status: AC
  Administered 2024-01-21: 10 mg via ORAL
  Filled 2024-01-21: qty 1

## 2024-01-21 MED ORDER — HYDROMORPHONE HCL 1 MG/ML IJ SOLN
1.0000 mg | Freq: Once | INTRAMUSCULAR | Status: AC
  Administered 2024-01-21: 1 mg via INTRAVENOUS
  Filled 2024-01-21: qty 1

## 2024-02-03 ENCOUNTER — Encounter: Payer: Self-pay | Admitting: *Deleted

## 2024-02-07 NOTE — Progress Notes (Unsigned)
 Referring Provider: Amos Kanner, PA-C Primary Care Physician:  Amos Kanner, PA-C Primary Gastroenterologist:  Dr. Mordechai April  Chief Complaint  Patient presents with   Abdominal Pain    Lower stomach hurts     HPI:   [name] is a 58 y.o. male presenting today at the request of Amos Kanner, PA-C for GERD and generalized abdominal pain.   Patient seen in the ER 01/20/24 for epigastric and periumbilical abdominal pain.  With work-up as follows:  CT A/P with contrast 01/20/24: IMPRESSION: 1. Moderate length segment of distal ileum/terminal ileum demonstrates diffuse wall thickening with associated mesenteric edema. Findings are compatible with nonspecific enteritis including infectious, inflammatory and ischemic etiologies. 2. Small amount of free fluid in the right upper quadrant and pelvis. 3. Mild hepatomegaly with fatty infiltration of the liver. 4. Aortic atherosclerosis.  CT angio abdomen pelvis with and without contrast 01/20/24: IMPRESSION: VASCULAR   1. Aortic Atherosclerosis (ICD10-I70.0).   NON-VASCULAR   1. Long loop of terminal and distal ileum demonstrating bowel wall thickening and mesenteric edema. Findings suggestive of enteritis with underlying ischemia not excluded. No bowel obstruction or perforation. No pneumatosis. Correlate with lactate levels. 2. Colonic diverticulosis with no acute diverticulitis. 3. Hepatic steatosis.  Lactic acid was normal. WBC elevated at 14.7  He was prescribed Pantoprazole  20 mg daily and dicyclomine  20 mg q8 hours.   Follow-up with PCP on 01/27/2024, patient reported pantoprazole  and dicyclomine  were managing his symptoms fairly well.  Recommended continuing current medications and seeing GI.   Today:  Was having upper abdominal pain when he went to the ER, but this resolved.  No heartburn on pantoprazole . Does have history of GERD but states this had been well controlled on omeprazole but this was changed to  pantoprazole  when he went to the ER. Occasional dysphagia to solid foods. Not very often.   Has been having lower abdominal pain since Monday. Stools are watery and have been since he went to the ER. Feels like he has to have a bowel movement every few minutes, but doesn't always have a BM. Having 5-6 Bms per day.  Also having bowel movements in the middle of the night. Prior to this, was having normal bowel movements. No brbpr or melena.   Abdominal pain is constant. Described as cramping and sharp pain. Comes and goes frequently throughout the day without specific aggravating or relieving factors.   Taking dicyclomine  once a day. Hasn't really been helping anything.   No nausea or vomiting.   No recent antibiotics. No recent travel.  No well water.   Prior colonoscopy: Never Prior EGD: Never  No Fhx of colon cancer, IBD.   Naproxen  just about every day for joint pain.   Past Medical History:  Diagnosis Date   Acid reflux disease    Arthritis    Asthma    Diabetes (HCC)    Gout    Headache    High blood pressure    High cholesterol    Psoriasis     Past Surgical History:  Procedure Laterality Date   JOINT REPLACEMENT     two total hip replacements   MULTIPLE EXTRACTIONS WITH ALVEOLOPLASTY N/A 06/19/2017   Procedure: MULTIPLE EXTRACTION WITH ALVEOLOPLASTY;  Surgeon: Ascencion Lava, DDS;  Location: [facility] OR;  Service: Oral Surgery;  Laterality: N/A;   SPINAL FUSION     TOTAL HIP ARTHROPLASTY      Current Outpatient Medications  Medication Sig Dispense Refill   acetaminophen -codeine (  TYLENOL  #3) 300-30 MG tablet Take by mouth every 4 (four) hours as needed for moderate pain (pain score 4-6).     albuterol  (PROVENTIL  HFA;VENTOLIN  HFA) 108 (90 BASE) MCG/ACT inhaler Inhale 2 puffs into the lungs 4 (four) times daily as needed for wheezing or shortness of breath.      albuterol  (PROVENTIL ) (2.5 MG/3ML) 0.083% nebulizer solution Inhale 3 mLs into the lungs 4 (four) times daily as  needed for wheezing or shortness of breath. 75 mL 12   amLODipine  (NORVASC ) 5 MG tablet Take 1 tablet (5 mg total) by mouth daily. 30 tablet 1   Apremilast (OTEZLA PO) Take by mouth.     atorvastatin  (LIPITOR) 40 MG tablet Take 40 mg by mouth daily.     budesonide-formoterol (SYMBICORT) 80-4.5 MCG/ACT inhaler Inhale 2 puffs into the lungs 2 (two) times daily.     cholecalciferol  (VITAMIN D ) 1000 units tablet Take 5,000 Units by mouth daily.     cloNIDine (CATAPRES - DOSED IN MG/24 HR) 0.2 mg/24hr patch Place 0.2 mg onto the skin once a week.     colchicine  0.6 MG tablet Take two tablets for first dose, then one tablet one hour later for a max dose of 1.8mg  on the first day.  Then take one tablet twice daily for 3 days. 12 tablet 0   dicyclomine  (BENTYL ) 20 MG tablet Take one every 8 hours for abdominal pain 20 tablet 0   gabapentin  (NEURONTIN ) 800 MG tablet Take 800 mg by mouth 3 (three) times daily.     levothyroxine  (SYNTHROID , LEVOTHROID) 50 MCG tablet Take 50 mcg by mouth daily before breakfast.     lisinopril  (PRINIVIL ,ZESTRIL ) 20 MG tablet Take 20 mg by mouth daily.     metFORMIN (GLUCOPHAGE) 500 MG tablet Take 500 mg by mouth daily with breakfast.     naproxen  (NAPROSYN ) 500 MG tablet Take 500 mg by mouth daily.      pantoprazole  (PROTONIX ) 20 MG tablet Take 1 tablet (20 mg total) by mouth daily. 30 tablet 0   potassium chloride  SA (K-DUR,KLOR-CON ) 20 MEQ tablet Take 1 tablet (20 mEq total) by mouth daily. 5 tablet 0   Skin Protectants, Misc. (EUCERIN) cream Apply 1 application topically as needed for dry skin.     No current facility-administered medications for this visit.    Allergies as of 02/10/2024   (No Known Allergies)    Family History  Problem Relation Age of Onset   Diabetes Other     Social History   Socioeconomic History   Marital status: Single    Spouse name: Not on file   Number of children: Not on file   Years of education: Not on file   Highest education  level: Not on file  Occupational History   Not on file  Tobacco Use   Smoking status: Every Day    Current packs/day: 0.50    Average packs/day: 0.5 packs/day for 20.0 years (10.0 ttl pk-yrs)    Types: Cigarettes   Smokeless tobacco: Never   Tobacco comments:    patient states he smokes daily but only socially   Vaping Use   Vaping status: Never Used  Substance and Sexual Activity   Alcohol use: Yes    Comment: occasionally   Drug use: No   Sexual activity: Not Currently  Other Topics Concern   Not on file  Social History Narrative   Not on file   Social Drivers of Health   Financial Resource Strain: Not on  file  Food Insecurity: Not on file  Transportation Needs: Not on file  Physical Activity: Not on file  Stress: Not on file  Social Connections: Not on file  Intimate Partner Violence: Not on file    Review of Systems: Gen: Denies any fever, chills, cold or flulike symptoms, presyncope, syncope. CV: Denies chest pain, heart palpitations.  Resp: Denies shortness of breath, cough. GI: See HPI GU : Denies urinary burning, urinary frequency, urinary hesitancy MS: Denies joint pain. Derm: Has psoriasis. Psych: Denies depression, anxiety. Heme: see HPI  Physical Exam: BP 139/61 (BP Location: Right Arm, Patient Position: Sitting, Cuff Size: Large)   Pulse 75   Temp 97.6 F (36.4 C) (Temporal)   Ht 5\' 7"  (1.702 m)   Wt 244 lb 3.2 oz (110.8 kg)   BMI 38.25 kg/m  General:   Alert and oriented. Pleasant and cooperative. Well-nourished and well-developed.  Head:  Normocephalic and atraumatic. Eyes:  Without icterus, sclera clear and conjunctiva pink.  Ears:  Normal auditory acuity. Lungs:  Clear to auscultation bilaterally. No wheezes, rales, or rhonchi. No distress.  Heart:  S1, S2 present without murmurs appreciated.  Abdomen:  +BS, soft, and non-distended. Mild generalized TTP. No HSM noted. No guarding or rebound. No masses appreciated.  Rectal:  Deferred  Msk:   Symmetrical without gross deformities. Normal posture. Extremities:  Without edema. Neurologic:  Alert and  oriented x4;  grossly normal neurologically. Psych:  Normal mood and affect.    Assessment:  58 year old male with history of GERD, arthritis, gout, asthma, psoriasis, HTN, HLD, diabetes, presenting today at the request of Kaleta Oregon, PA-C for GERD.  However, also discussed diarrhea, recent abnormal CT of the small bowel, and dysphagia.  GERD: Chronic.  Previously well-controlled on omeprazole 20 mg daily, but now taking pantoprazole  20 mg daily, which was started due to reports of upper abdominal pain, and symptoms remain well-controlled.  Prior upper abdominal pain resolved.   Notably, patient should have EGD for Barrett's esophagus screening in the setting of chronic GERD.  Dysphagia: Intermittent solid food dysphagia.  Needs EGD to evaluate for esophageal web, ring, stricture.  Doubt malignancy.  Change in bowel habits/diarrhea/lower abdominal pain/abnormal CT of the small bowel: Acute onset diarrhea in the latter part of April with multiple watery bowel movements per day as well as nocturnal stools.  Initially with upper abdominal pain, but now lower abdominal pain.  Evaluated in the emergency room 01/20/2024.  CT A/P with contrast showed moderate length segment of distal ileum/terminal ileum wall thickening with associated mesenteric edema.  Follow-up CT angio showed long loop of terminal and distal ileum bowel wall thickening and mesenteric edema, patent mesenteric vasculature.   Considering CT findings, acute bowel habit changes, I am concerned for infectious process.  Needs stool studies to rule this out.  If these are negative, needs colonoscopy to evaluate for underlying obligatory bowel disease.  Notably, he denies BRBPR, melena, family history of colon cancer or IBD.     Plan:  CBC, BMP, TSH, CRP, fecal calprotectin, GI pathogen panel, Giardia, Cryptosporidium, C.  difficile GDH and toxin A/B. If stool testing is negative, will proceed with EGD and colonoscopy with Dr. Mordechai April. Continue pantoprazole  20 mg daily. Continue dicyclomine  every 8 hours as needed. Stop naproxen  and avoid all NSAIDs.   Shana Daring, PA-C [facility] 02/10/2024

## 2024-02-10 ENCOUNTER — Encounter: Payer: Self-pay | Admitting: Gastroenterology

## 2024-02-10 ENCOUNTER — Ambulatory Visit: Admitting: Gastroenterology

## 2024-02-10 VITALS — BP 139/61 | HR 75 | Temp 97.6°F | Ht 67.0 in | Wt 244.2 lb

## 2024-02-10 DIAGNOSIS — R1310 Dysphagia, unspecified: Secondary | ICD-10-CM

## 2024-02-10 DIAGNOSIS — K219 Gastro-esophageal reflux disease without esophagitis: Secondary | ICD-10-CM

## 2024-02-10 DIAGNOSIS — R197 Diarrhea, unspecified: Secondary | ICD-10-CM

## 2024-02-10 DIAGNOSIS — R1030 Lower abdominal pain, unspecified: Secondary | ICD-10-CM | POA: Diagnosis not present

## 2024-02-10 DIAGNOSIS — R933 Abnormal findings on diagnostic imaging of other parts of digestive tract: Secondary | ICD-10-CM

## 2024-02-10 NOTE — Patient Instructions (Addendum)
 Please have blood work and stool testing completed at [facility]. This is in the 2 story brick building across from [facility] Emergency Department on the second floor.   We will call with results and further recommendations.  If your stool testing is unrevealing, we will proceed with a colonoscopy and upper endoscopy.  Continue taking pantoprazole  20 mg daily for now.  Continue taking dicyclomine .  You can take this up to every 8 hours for abdominal pain.  Please stop taking naproxen  and avoid all NSAID products including ibuprofen , Aleve , Advil , BC powders, Goody powders, and anything that says "NSAID" on the package.  Shana Daring, PA-C [facility]

## 2024-02-18 ENCOUNTER — Ambulatory Visit: Payer: Self-pay | Admitting: Gastroenterology

## 2024-02-18 LAB — BASIC METABOLIC PANEL WITH GFR
BUN/Creatinine Ratio: 11 (calc) (ref 6–22)
BUN: 7 mg/dL (ref 7–25)
CO2: 24 mmol/L (ref 20–32)
Calcium: 9.1 mg/dL (ref 8.6–10.3)
Chloride: 105 mmol/L (ref 98–110)
Creat: 0.62 mg/dL — ABNORMAL LOW (ref 0.70–1.30)
Glucose, Bld: 108 mg/dL — ABNORMAL HIGH (ref 65–99)
Potassium: 4 mmol/L (ref 3.5–5.3)
Sodium: 140 mmol/L (ref 135–146)
eGFR: 111 mL/min/{1.73_m2} (ref >=60)

## 2024-02-18 LAB — CBC WITH DIFFERENTIAL/PLATELET
Absolute Lymphocytes: 2596 {cells}/uL (ref 850–3900)
Absolute Monocytes: 803 {cells}/uL (ref 200–950)
Basophils Absolute: 82 {cells}/uL (ref 0–200)
Basophils Relative: 0.8 %
Eosinophils Absolute: 464 {cells}/uL (ref 15–500)
Eosinophils Relative: 4.5 %
HCT: 46.1 % (ref 38.5–50.0)
Hemoglobin: 15.1 g/dL (ref 13.2–17.1)
MCH: 30.5 pg (ref 27.0–33.0)
MCHC: 32.8 g/dL (ref 32.0–36.0)
MCV: 93.1 fL (ref 80.0–100.0)
MPV: 11.6 fL (ref 7.5–12.5)
Monocytes Relative: 7.8 %
Neutro Abs: 6355 {cells}/uL (ref 1500–7800)
Neutrophils Relative %: 61.7 %
Platelets: 297 10*3/uL (ref 140–400)
RBC: 4.95 10*6/uL (ref 4.20–5.80)
RDW: 13.2 % (ref 11.0–15.0)
Total Lymphocyte: 25.2 %
WBC: 10.3 10*3/uL (ref 3.8–10.8)

## 2024-02-18 LAB — C-REACTIVE PROTEIN: CRP: 19.5 mg/L — ABNORMAL HIGH (ref <8.0)

## 2024-02-18 LAB — TSH: TSH: 1.47 m[IU]/L (ref 0.40–4.50)

## 2024-02-24 ENCOUNTER — Encounter: Payer: Self-pay | Admitting: *Deleted

## 2024-05-16 NOTE — Progress Notes (Signed)
 Surgery orders requested via Epic inbox.

## 2024-05-19 NOTE — H&P (Signed)
 Patient's anticipated LOS is less than 2 midnights, meeting these requirements: - Younger than 22 - Lives within 1 hour of care - Has a competent adult at home to recover with post-op recover - NO history of  - Chronic pain requiring opiods  - Diabetes  - Coronary Artery Disease  - Heart failure  - Heart attack  - Stroke  - DVT/VTE  - Cardiac arrhythmia  - Respiratory Failure/COPD  - Renal failure  - Anemia  - Advanced Liver disease     [name] is an 58 y.o. male.    Chief Complaint: left shoulder pain  HPI: Pt is a 58 y.o. male complaining of left shoulder pain for multiple years. Pain had continually increased since the beginning. X-rays in the clinic show end-stage arthritic changes of the left shoulder. Pt has tried various conservative treatments which have failed to alleviate their symptoms, including injections and therapy. Various options are discussed with the patient. Risks, benefits and expectations were discussed with the patient. Patient understand the risks, benefits and expectations and wishes to proceed with surgery.   PCP:  Joshua Clayborne RAMAN, PA-C  D/C Plans: Home  PMH: Past Medical History:  Diagnosis Date   Acid reflux disease    Arthritis    Asthma    Diabetes (HCC)    Gout    Headache    High blood pressure    High cholesterol    Psoriasis     PSH: Past Surgical History:  Procedure Laterality Date   JOINT REPLACEMENT     two total hip replacements   MULTIPLE EXTRACTIONS WITH ALVEOLOPLASTY N/A 06/19/2017   Procedure: MULTIPLE EXTRACTION WITH ALVEOLOPLASTY;  Surgeon: Sheryle Hamilton, DDS;  Location: [facility] OR;  Service: Oral Surgery;  Laterality: N/A;   SPINAL FUSION     TOTAL HIP ARTHROPLASTY      Social History:  reports that he has been smoking cigarettes. He has a 10 pack-year smoking history. He has never used smokeless tobacco. He reports current alcohol use. He reports that he does not use drugs. BMI: There is no height or weight on  file to calculate BMI.  Lab Results  Component Value Date   ALBUMIN 3.3 (L) 01/20/2024   Diabetes: Patient does not have a diagnosis of diabetes.     Smoking Status: Social History   Tobacco Use  Smoking Status Every Day   Current packs/day: 0.50   Average packs/day: 0.5 packs/day for 20.0 years (10.0 ttl pk-yrs)   Types: Cigarettes  Smokeless Tobacco Never  Tobacco Comments   patient states he smokes daily but only socially    Ready to quit: Not Answered Counseling given: Not Answered Tobacco comments: patient states he smokes daily but only socially   The patient has participated in a 4-week cessation program.          Allergies:  No Known Allergies  Medications: No current facility-administered medications for this encounter.   Current Outpatient Medications  Medication Sig Dispense Refill   acetaminophen -codeine (TYLENOL  #3) 300-30 MG tablet Take by mouth every 4 (four) hours as needed for moderate pain (pain score 4-6).     albuterol  (PROVENTIL  HFA;VENTOLIN  HFA) 108 (90 BASE) MCG/ACT inhaler Inhale 2 puffs into the lungs 4 (four) times daily as needed for wheezing or shortness of breath.      albuterol  (PROVENTIL ) (2.5 MG/3ML) 0.083% nebulizer solution Inhale 3 mLs into the lungs 4 (four) times daily as needed for wheezing or shortness of breath. 75 mL 12  amLODipine  (NORVASC ) 5 MG tablet Take 1 tablet (5 mg total) by mouth daily. 30 tablet 1   Apremilast (OTEZLA PO) Take by mouth.     atorvastatin  (LIPITOR) 40 MG tablet Take 40 mg by mouth daily.     budesonide-formoterol (SYMBICORT) 80-4.5 MCG/ACT inhaler Inhale 2 puffs into the lungs 2 (two) times daily.     cholecalciferol  (VITAMIN D ) 1000 units tablet Take 5,000 Units by mouth daily.     cloNIDine (CATAPRES - DOSED IN MG/24 HR) 0.2 mg/24hr patch Place 0.2 mg onto the skin once a week.     colchicine  0.6 MG tablet Take two tablets for first dose, then one tablet one hour later for a max dose of 1.8mg  on the  first day.  Then take one tablet twice daily for 3 days. 12 tablet 0   dicyclomine  (BENTYL ) 20 MG tablet Take one every 8 hours for abdominal pain 20 tablet 0   gabapentin  (NEURONTIN ) 800 MG tablet Take 800 mg by mouth 3 (three) times daily.     levothyroxine  (SYNTHROID , LEVOTHROID) 50 MCG tablet Take 50 mcg by mouth daily before breakfast.     lisinopril  (PRINIVIL ,ZESTRIL ) 20 MG tablet Take 20 mg by mouth daily.     metFORMIN (GLUCOPHAGE) 500 MG tablet Take 500 mg by mouth daily with breakfast.     naproxen  (NAPROSYN ) 500 MG tablet Take 500 mg by mouth daily.      pantoprazole  (PROTONIX ) 20 MG tablet Take 1 tablet (20 mg total) by mouth daily. 30 tablet 0   potassium chloride  SA (K-DUR,KLOR-CON ) 20 MEQ tablet Take 1 tablet (20 mEq total) by mouth daily. 5 tablet 0   Skin Protectants, Misc. (EUCERIN) cream Apply 1 application topically as needed for dry skin.      No results found for this or any previous visit (from the past 48 hours). No results found.  ROS: Pain with rom of the left upper extremity  Physical Exam: Alert and oriented 58 y.o. male in no acute distress Cranial nerves 2-12 intact Cervical spine: full rom with no tenderness, nv intact distally Chest: active breath sounds bilaterally, no wheeze rhonchi or rales Heart: regular rate and rhythm, no murmur Abd: non tender non distended with active bowel sounds Hip is stable with rom  Left shoulder painful and weak rom Nv intact distally No rashes or edema  Assessment/Plan Assessment: left shoulder cuff arthropathy  Plan:  Patient will undergo a left reverse total shoulder by Dr. Kay at [facility] Risks benefits and expectations were discussed with the patient. Patient understand risks, benefits and expectations and wishes to proceed. Preoperative templating of the joint replacement has been completed, documented, and submitted to the Operating Room personnel in order to optimize intra-operative equipment management.    Arvella Fireman PA-C, MPAS [facility] is now [employer] [address], [address], [place], [place] [address] Phone: [phone] [email or web address] Facebook  [employer]

## 2024-05-20 NOTE — Progress Notes (Addendum)
 COVID Vaccine received:  []  No [x]  Yes Date of any COVID positive Test in last 90 days: no PCP - Clayborne Molt PA-C Cardiologist - n/a  Chest x-ray -  EKG -  05/23/24 Epic Stress Test -  ECHO -  Cardiac Cath -   Bowel Prep - [x]  No  []   Yes ______  Pacemaker / ICD device [x]  No []  Yes   Spinal Cord Stimulator:[x]  No []  Yes       History of Sleep Apnea? [x]  No []  Yes   CPAP used?- [x]  No []  Yes    Does the patient monitor blood sugar?          [x]  No []  Yes  []  N/A  Patient has: []  NO Hx DM   []  Pre-DM                 []  DM1  [x]   DM2 Does patient have a [employer] or Dexacom? [x]  No []  Yes   Fasting Blood Sugar Ranges-  Checks Blood Sugar ___0__ times a day  GLP1 agonist / usual dose - no GLP1 instructions:  SGLT-2 inhibitors / usual dose - no SGLT-2 instructions:   Blood Thinner / Instructions:no Aspirin  Instructions:no  Comments:   Activity level: Patient is able to climb a flight of stairs without difficulty; [x]  No CP  [x]  No SOB,    Patient can perform ADLs without assistance.   Anesthesia review:   Patient denies shortness of breath, fever, cough and chest pain at PAT appointment.  Patient verbalized understanding and agreement to the Pre-Surgical Instructions that were given to them at this PAT appointment. Patient was also educated of the need to review these PAT instructions again prior to his/her surgery.I reviewed the appropriate phone numbers to call if they have any and questions or concerns.

## 2024-05-20 NOTE — Patient Instructions (Signed)
 SURGICAL WAITING ROOM VISITATION  Patients having surgery or a procedure may have no more than 2 support people in the waiting area - these visitors may rotate.    Children under the age of 12 must have an adult with them who is not the patient.  Visitors with respiratory illnesses are discouraged from visiting and should remain at home.  If the patient needs to stay at the hospital during part of their recovery, the visitor guidelines for inpatient rooms apply. Pre-op nurse will coordinate an appropriate time for 1 support person to accompany patient in pre-op.  This support person may not rotate.    Please refer to the [facility] website for the visitor guidelines for Inpatients (after your surgery is over and you are in a regular room).       Your procedure is scheduled on: 06/03/24   Report to [facility] Main Entrance    Report to admitting at 9:30 AM   Call this number if you have problems the morning of surgery [phone]   Do not eat food :After Midnight.   After Midnight you may have the following liquids until 9 AM  DAY OF SURGERY  Water Non-Citrus Juices (without pulp, NO RED-Apple, White grape, White cranberry) Black Coffee (NO MILK/CREAM OR CREAMERS, sugar ok)  Clear Tea (NO MILK/CREAM OR CREAMERS, sugar ok) regular and decaf                             Plain Jell-O (NO RED)                                           Fruit ices (not with fruit pulp, NO RED)                                     Popsicles (NO RED)                                                               Sports drinks like Gatorade (NO RED)                  The day of surgery:  Drink ONE (1) Pre-Surgery G2 at 9:30 AM the morning of surgery. Drink in one sitting. Do not sip.  This drink was given to you during your hospital  pre-op appointment visit. Nothing else to drink after completing the  Pre-Surgery G2.     Oral Hygiene is also important to reduce your risk of infection.                                     Remember - BRUSH YOUR TEETH THE MORNING OF SURGERY WITH YOUR REGULAR TOOTHPASTE   Do NOT smoke after Midnight   Stop all vitamins and herbal supplements 7 days before surgery.   Take these medicines the morning of surgery with A SIP OF WATER: Tylenol  #3, amlodipine , otezla(apremilast), Atorvastatin , gabapentin , levothyroxine , pantoprazole , inhalers  DO NOT TAKE ANY  ORAL DIABETIC MEDICATIONS DAY OF YOUR SURGERY Do not take Metformin the morning of surgery             You may not have any metal on your body including hair pins, jewelry, and body piercing             Do not wear make-up, lotions, powders, perfumes/cologne, or deodorant              Men may shave face and neck.   Do not bring valuables to the hospital. [facility] IS NOT             RESPONSIBLE   FOR VALUABLES.   Contacts, glasses, dentures or bridgework may not be worn into surgery.  DO NOT BRING YOUR HOME MEDICATIONS TO THE HOSPITAL. PHARMACY WILL DISPENSE MEDICATIONS LISTED ON YOUR MEDICATION LIST TO YOU DURING YOUR ADMISSION IN THE HOSPITAL!    Patients discharged on the day of surgery will not be allowed to drive home.  Someone NEEDS to stay with you for the first 24 hours after anesthesia.   Special Instructions: Bring a copy of your healthcare power of attorney and living will documents the day of surgery if you haven't scanned them before.              Please read over the following fact sheets you were given: IF YOU HAVE QUESTIONS ABOUT YOUR PRE-OP INSTRUCTIONS PLEASE CALL [phone] [place]   If you received a COVID test during your pre-op visit  it is requested that you wear a mask when out in public, stay away from anyone that may not be feeling well and notify your surgeon if you develop symptoms. If you test positive for Covid or have been in contact with anyone that has tested positive in the last 10 days please notify you surgeon.      Pre-operative 5 CHG Bath Instructions    You can play a key role in reducing the risk of infection after surgery. Your skin needs to be as free of germs as possible. You can reduce the number of germs on your skin by washing with CHG (chlorhexidine gluconate) soap before surgery. CHG is an antiseptic soap that kills germs and continues to kill germs even after washing.   DO NOT use if you have an allergy to chlorhexidine/CHG or antibacterial soaps. If your skin becomes reddened or irritated, stop using the CHG and notify one of our RNs at [phone].   Please shower with the CHG soap starting 4 days before surgery using the following schedule:     Please keep in mind the following:  DO NOT shave, including legs and underarms, starting the day of your first shower.   You may shave your face at any point before/day of surgery.  Place clean sheets on your bed the day you start using CHG soap. Use a clean washcloth (not used since being washed) for each shower. DO NOT sleep with pets once you start using the CHG.   CHG Shower Instructions:  If you choose to wash your hair and private area, wash first with your normal shampoo/soap.  After you use shampoo/soap, rinse your hair and body thoroughly to remove shampoo/soap residue.  Turn the water OFF and apply about 3 tablespoons (45 ml) of CHG soap to a CLEAN washcloth.  Apply CHG soap ONLY FROM YOUR NECK DOWN TO YOUR TOES (washing for 3-5 minutes)  DO NOT use CHG soap on face, private areas, open wounds, or sores.  Pay special attention to the area where your surgery is being performed.  If you are having back surgery, having someone wash your back for you may be helpful. Wait 2 minutes after CHG soap is applied, then you may rinse off the CHG soap.  Pat dry with a clean towel  Put on clean clothes/pajamas   If you choose to wear lotion, please use ONLY the CHG-compatible lotions on the back of this paper.     Additional instructions for the day of surgery: DO NOT APPLY any  lotions, deodorants, cologne, or perfumes.   Put on clean/comfortable clothes.  Brush your teeth.  Ask your nurse before applying any prescription medications to the skin.      CHG Compatible Lotions   Aveeno Moisturizing lotion  Cetaphil Moisturizing Cream  Cetaphil Moisturizing Lotion  Clairol Herbal Essence Moisturizing Lotion, Dry Skin  Clairol Herbal Essence Moisturizing Lotion, Extra Dry Skin  Clairol Herbal Essence Moisturizing Lotion, Normal Skin  Curel Age Defying Therapeutic Moisturizing Lotion with Alpha Hydroxy  Curel Extreme Care Body Lotion  Curel Soothing Hands Moisturizing Hand Lotion  Curel Therapeutic Moisturizing Cream, Fragrance-Free  Curel Therapeutic Moisturizing Lotion, Fragrance-Free  Curel Therapeutic Moisturizing Lotion, Original Formula  Eucerin Daily Replenishing Lotion  Eucerin Dry Skin Therapy Plus Alpha Hydroxy Crme  Eucerin Dry Skin Therapy Plus Alpha Hydroxy Lotion  Eucerin Original Crme  Eucerin Original Lotion  Eucerin Plus Crme Eucerin Plus Lotion  Eucerin TriLipid Replenishing Lotion  Keri Anti-Bacterial Hand Lotion  Keri Deep Conditioning Original Lotion Dry Skin Formula Softly Scented  Keri Deep Conditioning Original Lotion, Fragrance Free Sensitive Skin Formula  Keri Lotion Fast Absorbing Fragrance Free Sensitive Skin Formula  Keri Lotion Fast Absorbing Softly Scented Dry Skin Formula  Keri Original Lotion  Keri Skin Renewal Lotion Keri Silky Smooth Lotion  Keri Silky Smooth Sensitive Skin Lotion  Nivea Body Creamy Conditioning Oil  Nivea Body Extra Enriched Lotion  Nivea Body Original Lotion  Nivea Body Sheer Moisturizing Lotion Nivea Crme  Nivea Skin Firming Lotion  NutraDerm 30 Skin Lotion  NutraDerm Skin Lotion  NutraDerm Therapeutic Skin Cream  NutraDerm Therapeutic Skin Lotion  ProShield Protective Hand Cream   Incentive Spirometer  An incentive spirometer is a tool that can help keep your lungs clear and active.  This tool measures how well you are filling your lungs with each breath. Taking long deep breaths may help reverse or decrease the chance of developing breathing (pulmonary) problems (especially infection) following: A long period of time when you are unable to move or be active. BEFORE THE PROCEDURE  If the spirometer includes an indicator to show your best effort, your nurse or respiratory therapist will set it to a desired goal. If possible, sit up straight or lean slightly forward. Try not to slouch. Hold the incentive spirometer in an upright position. INSTRUCTIONS FOR USE  Sit on the edge of your bed if possible, or sit up as far as you can in bed or on a chair. Hold the incentive spirometer in an upright position. Breathe out normally. Place the mouthpiece in your mouth and seal your lips tightly around it. Breathe in slowly and as deeply as possible, raising the piston or the ball toward the top of the column. Hold your breath for 3-5 seconds or for as long as possible. Allow the piston or ball to fall to the bottom of the column. Remove the mouthpiece from your mouth and breathe out normally. Rest for a few seconds and repeat  Steps 1 through 7 at least 10 times every 1-2 hours when you are awake. Take your time and take a few normal breaths between deep breaths. The spirometer may include an indicator to show your best effort. Use the indicator as a goal to work toward during each repetition. After each set of 10 deep breaths, practice coughing to be sure your lungs are clear. If you have an incision (the cut made at the time of surgery), support your incision when coughing by placing a pillow or rolled up towels firmly against it. Once you are able to get out of bed, walk around indoors and cough well. You may stop using the incentive spirometer when instructed by your caregiver.  RISKS AND COMPLICATIONS Take your time so you do not get dizzy or light-headed. If you are in pain, you may  need to take or ask for pain medication before doing incentive spirometry. It is harder to take a deep breath if you are having pain. AFTER USE Rest and breathe slowly and easily. It can be helpful to keep track of a log of your progress. Your caregiver can provide you with a simple table to help with this. If you are using the spirometer at home, follow these instructions: SEEK MEDICAL CARE IF:  You are having difficultly using the spirometer. You have trouble using the spirometer as often as instructed. Your pain medication is not giving enough relief while using the spirometer. You develop fever of 100.5 F (38.1 C) or higher. SEEK IMMEDIATE MEDICAL CARE IF:  You cough up bloody sputum that had not been present before. You develop fever of 102 F (38.9 C) or greater. You develop worsening pain at or near the incision site. MAKE SURE YOU:  Understand these instructions. Will watch your condition. Will get help right away if you are not doing well or get worse.   [place]- Preparing for Total Shoulder Arthroplasty    Before surgery, you can play an important role. Because skin is not sterile, your skin needs to be as free of germs as possible. You can reduce the number of germs on your skin by using the following products. Benzoyl Peroxide Gel Reduces the number of germs present on the skin Applied twice a day to shoulder area starting two days before surgery    ==================================================================  Please follow these instructions carefully:  BENZOYL PEROXIDE 5% GEL  Please do not use if you have an allergy to benzoyl peroxide.   If your skin becomes reddened/irritated stop using the benzoyl peroxide.  Starting two days before surgery, apply as follows: Apply benzoyl peroxide in the morning and at night. Apply after taking a shower. If you are not taking a shower clean entire shoulder front, back, and side along with the armpit with a clean wet  washcloth.  Place a quarter-sized dollop on your shoulder and rub in thoroughly, making sure to cover the front, back, and side of your shoulder, along with the armpit.   2 days before ____ AM   ____ PM              1 day before ____ AM   ____ PM                         Do this twice a day for two days.  (Last application is the night before surgery, AFTER using the CHG soap as described below).  Do NOT apply benzoyl peroxide gel on  the day of surgery.  How to Manage Your Diabetes Before and After Surgery  Why is it important to control my blood sugar before and after surgery? Improving blood sugar levels before and after surgery helps healing and can limit problems. A way of improving blood sugar control is eating a healthy diet by:  Eating less sugar and carbohydrates  Increasing activity/exercise  Talking with your doctor about reaching your blood sugar goals High blood sugars (greater than 180 mg/dL) can raise your risk of infections and slow your recovery, so you will need to focus on controlling your diabetes during the weeks before surgery. Make sure that the doctor who takes care of your diabetes knows about your planned surgery including the date and location.  How do I manage my blood sugar before surgery? Check your blood sugar at least 4 times a day, starting 2 days before surgery, to make sure that the level is not too high or low. Check your blood sugar the morning of your surgery when you wake up and every 2 hours until you get to the Short Stay unit. If your blood sugar is less than 70 mg/dL, you will need to treat for low blood sugar: Do not take insulin. Treat a low blood sugar (less than 70 mg/dL) with  cup of clear juice (cranberry or apple), 4 glucose tablets, OR glucose gel. Recheck blood sugar in 15 minutes after treatment (to make sure it is greater than 70 mg/dL). If your blood sugar is not greater than 70 mg/dL on recheck, call [phone] for further  instructions. Report your blood sugar to the short stay nurse when you get to Short Stay.  If you are admitted to the hospital after surgery: Your blood sugar will be checked by the staff and you will probably be given insulin after surgery (instead of oral diabetes medicines) to make sure you have good blood sugar levels. The goal for blood sugar control after surgery is 80-180 mg/dL.   WHAT DO I DO ABOUT MY DIABETES MEDICATION?  Do not take oral diabetes medicines (pills) the morning of surgery.  Do not take Metformin the morning of surgery.  DO NOT TAKE THE FOLLOWING 7 DAYS PRIOR TO SURGERY: Ozempic, Wegovy, Rybelsus (Semaglutide), Byetta (exenatide), Bydureon (exenatide ER), Victoza, Saxenda (liraglutide), or Trulicity (dulaglutide) Mounjaro (Tirzepatide) Adlyxin (Lixisenatide), Polyethylene Glycol Loxenatide.  Patient Signature:  Date:   Nurse Signature:  Date:

## 2024-05-23 ENCOUNTER — Other Ambulatory Visit: Payer: Self-pay

## 2024-05-23 ENCOUNTER — Encounter (HOSPITAL_COMMUNITY): Payer: Self-pay

## 2024-05-23 ENCOUNTER — Encounter (HOSPITAL_COMMUNITY)
Admission: RE | Admit: 2024-05-23 | Discharge: 2024-05-23 | Disposition: A | Discharge status: Home / Self Care | Source: Ambulatory Visit | Attending: Orthopedic Surgery | Admitting: Orthopedic Surgery

## 2024-05-23 VITALS — BP 129/100 | HR 70 | Temp 97.5°F | Resp 16 | Ht 67.0 in | Wt 235.0 lb

## 2024-05-23 DIAGNOSIS — E119 Type 2 diabetes mellitus without complications: Secondary | ICD-10-CM | POA: Insufficient documentation

## 2024-05-23 DIAGNOSIS — Z01818 Encounter for other preprocedural examination: Secondary | ICD-10-CM | POA: Diagnosis present

## 2024-05-23 DIAGNOSIS — I10 Essential (primary) hypertension: Secondary | ICD-10-CM | POA: Diagnosis not present

## 2024-05-23 LAB — BASIC METABOLIC PANEL WITH GFR
Anion gap: 10 (ref 5–15)
BUN: 7 mg/dL (ref 6–20)
CO2: 27 mmol/L (ref 22–32)
Calcium: 9.5 mg/dL (ref 8.9–10.3)
Chloride: 103 mmol/L (ref 98–111)
Creatinine, Ser: 0.62 mg/dL (ref 0.61–1.24)
GFR, Estimated: >60 mL/min (ref >60)
Glucose, Bld: 137 mg/dL — ABNORMAL HIGH (ref 70–99)
Potassium: 3.9 mmol/L (ref 3.5–5.1)
Sodium: 140 mmol/L (ref 135–145)

## 2024-05-23 LAB — CBC
HCT: 45.5 % (ref 39.0–52.0)
Hemoglobin: 14.9 g/dL (ref 13.0–17.0)
MCH: 30.8 pg (ref 26.0–34.0)
MCHC: 32.7 g/dL (ref 30.0–36.0)
MCV: 94 fL (ref 80.0–100.0)
Platelets: 334 K/uL (ref 150–400)
RBC: 4.84 MIL/uL (ref 4.22–5.81)
RDW: 13 % (ref 11.5–15.5)
WBC: 12 K/uL — ABNORMAL HIGH (ref 4.0–10.5)
nRBC: 0 % (ref 0.0–0.2)

## 2024-05-23 LAB — SURGICAL PCR SCREEN
MRSA, PCR: NEGATIVE
Staphylococcus aureus: NEGATIVE

## 2024-05-23 LAB — GLUCOSE, CAPILLARY: Glucose-Capillary: 146 mg/dL — ABNORMAL HIGH (ref 70–99)

## 2024-06-02 NOTE — Anesthesia Preprocedure Evaluation (Addendum)
 Anesthesia Evaluation    Reviewed: Allergy & Precautions, Patient's Chart, lab work & pertinent test results  Airway        Dental   Pulmonary asthma , Current Smoker and Patient abstained from smoking.          Cardiovascular hypertension, Pt. on medications      Neuro/Psych    GI/Hepatic ,GERD  ,,  Endo/Other  diabetesHypothyroidism    Renal/GU Lab Results      Component                Value               Date                          K                        3.9                 05/23/2024                CO2                      27                  05/23/2024                BUN                      7                   05/23/2024                CREATININE               0.62                05/23/2024                GFRNONAA                 >60                 05/23/2024                         Musculoskeletal  (+) Arthritis  (Gout),    Abdominal   Peds  Hematology Lab Results      Component                Value               Date                      WBC                      12.0 (H)            05/23/2024                HGB                      14.9                05/23/2024                HCT  45.5                05/23/2024                MCV                      94.0                05/23/2024                PLT                      334                 05/23/2024              Anesthesia Other Findings   Reproductive/Obstetrics                              Anesthesia Physical Anesthesia Plan  ASA: 3  Anesthesia Plan: General and Regional   Post-op Pain Management: Regional block* and Minimal or no pain anticipated   Induction: Intravenous  PONV Risk Score and Plan: Treatment may vary due to age or medical condition and Midazolam   Airway Management Planned: Oral ETT  Additional Equipment: None  Intra-op Plan:   Post-operative Plan: Extubation in  OR  Informed Consent:      Dental advisory given  Plan Discussed with: CRNA and Surgeon  Anesthesia Plan Comments: (GA w L ISB e exparel)         Anesthesia Quick Evaluation

## 2024-06-03 ENCOUNTER — Encounter (HOSPITAL_COMMUNITY): Payer: Self-pay | Admitting: Anesthesiology

## 2024-06-03 ENCOUNTER — Encounter (HOSPITAL_COMMUNITY): Payer: Self-pay | Admitting: Medical

## 2024-06-03 ENCOUNTER — Encounter (HOSPITAL_COMMUNITY)
Admission: RE | Disposition: A | Discharge status: Home / Self Care | Payer: Self-pay | Source: Home / Self Care | Attending: Orthopedic Surgery

## 2024-06-03 ENCOUNTER — Ambulatory Visit (HOSPITAL_COMMUNITY)
Admission: RE | Admit: 2024-06-03 | Discharge: 2024-06-03 | Disposition: A | Discharge status: Home / Self Care | Attending: Orthopedic Surgery | Admitting: Orthopedic Surgery

## 2024-06-03 SURGERY — ARTHROPLASTY, SHOULDER, TOTAL, REVERSE
Anesthesia: Choice | Site: Shoulder | Laterality: Left

## 2024-06-03 NOTE — Progress Notes (Addendum)
 Patient came in for surgery today with reported being on antibiotics for a foot infection.  Upon assessment the foot doesn't appear red or swollen.  Patient has some crusted like areas on the top of toes.  Patient reported that when he went in for initial treatment both of his feet were really red and swollen.  He said he was treated at [facility] by Clayborne Molt.  I attempted to contact that office to get more information but they do not open until 9 am.  Patient friend that is with him stated that he went to seek care about a week ago.   They think he was being treated for cellulitis with what they think is doxycycline .  The patient uses [facility] for his prescriptions.    Pharmacy was able to find out that patient is taking doxy 100  and added it to his home medications list.  Patient reported that he thinks he has about 12 pills left.    Based on when the prescription was sent in to his pharmacy, he was treated on 05/25/24  Notified Dr Kay about patients condition.   9:13 AM Dr kay saw patient in pre-op plan to cancel today and reevaluate in October.  Patient and Friend verbalized understanding.
